# Patient Record
Sex: Female | Born: 1935 | Race: White | Hispanic: No | Smoking: Never smoker
Health system: Southern US, Community
[De-identification: ages and names within clinical notes are randomized; demographics above are authoritative.]

## PROBLEM LIST (undated history)

## (undated) DIAGNOSIS — R413 Other amnesia: Secondary | ICD-10-CM

## (undated) DIAGNOSIS — C801 Malignant (primary) neoplasm, unspecified: Secondary | ICD-10-CM

## (undated) DIAGNOSIS — I341 Nonrheumatic mitral (valve) prolapse: Secondary | ICD-10-CM

## (undated) DIAGNOSIS — C859A Non-Hodgkin lymphoma, unspecified, in remission: Secondary | ICD-10-CM

## (undated) DIAGNOSIS — M810 Age-related osteoporosis without current pathological fracture: Secondary | ICD-10-CM

## (undated) DIAGNOSIS — C8590 Non-Hodgkin lymphoma, unspecified, unspecified site: Secondary | ICD-10-CM

## (undated) DIAGNOSIS — I10 Essential (primary) hypertension: Principal | ICD-10-CM

## (undated) DIAGNOSIS — S72002A Fracture of unspecified part of neck of left femur, initial encounter for closed fracture: Secondary | ICD-10-CM

## (undated) DIAGNOSIS — Z22322 Carrier or suspected carrier of Methicillin resistant Staphylococcus aureus: Secondary | ICD-10-CM

## (undated) DIAGNOSIS — N390 Urinary tract infection, site not specified: Secondary | ICD-10-CM

## (undated) DIAGNOSIS — S7290XA Unspecified fracture of unspecified femur, initial encounter for closed fracture: Secondary | ICD-10-CM

## (undated) DIAGNOSIS — F0390 Unspecified dementia without behavioral disturbance: Secondary | ICD-10-CM

## (undated) DIAGNOSIS — I82409 Acute embolism and thrombosis of unspecified deep veins of unspecified lower extremity: Secondary | ICD-10-CM

## (undated) DIAGNOSIS — I4891 Unspecified atrial fibrillation: Secondary | ICD-10-CM

## (undated) DIAGNOSIS — E079 Disorder of thyroid, unspecified: Secondary | ICD-10-CM

## (undated) DIAGNOSIS — S329XXA Fracture of unspecified parts of lumbosacral spine and pelvis, initial encounter for closed fracture: Secondary | ICD-10-CM

## (undated) HISTORY — DX: Fracture of unspecified parts of lumbosacral spine and pelvis, initial encounter for closed fracture: S32.9XXA

## (undated) HISTORY — DX: Fracture of unspecified part of neck of left femur, initial encounter for closed fracture: S72.002A

## (undated) HISTORY — DX: Nonrheumatic mitral (valve) prolapse: I34.1

## (undated) HISTORY — DX: Carrier or suspected carrier of methicillin resistant Staphylococcus aureus: Z22.322

## (undated) HISTORY — DX: Unspecified fracture of unspecified femur, initial encounter for closed fracture: S72.90XA

## (undated) HISTORY — DX: Essential (primary) hypertension: I10

## (undated) HISTORY — PX: EYE SURGERY: SHX253

## (undated) SURGERY — Surgical Case
Anesthesia: *Unknown

---

## 1999-09-15 ENCOUNTER — Other Ambulatory Visit: Admission: RE | Admit: 1999-09-15 | Discharge: 1999-09-15 | Payer: Self-pay | Admitting: Cardiology

## 2000-03-31 ENCOUNTER — Encounter: Admission: RE | Admit: 2000-03-31 | Discharge: 2000-03-31 | Payer: Self-pay | Admitting: Cardiology

## 2000-03-31 ENCOUNTER — Encounter: Payer: Self-pay | Admitting: Cardiology

## 2000-09-20 ENCOUNTER — Other Ambulatory Visit: Admission: RE | Admit: 2000-09-20 | Discharge: 2000-09-20 | Payer: Self-pay | Admitting: Internal Medicine

## 2001-01-27 ENCOUNTER — Encounter: Payer: Self-pay | Admitting: General Surgery

## 2001-01-27 ENCOUNTER — Encounter: Admission: RE | Admit: 2001-01-27 | Discharge: 2001-01-27 | Payer: Self-pay | Admitting: General Surgery

## 2001-03-10 ENCOUNTER — Encounter: Payer: Self-pay | Admitting: *Deleted

## 2001-03-10 ENCOUNTER — Encounter: Admission: RE | Admit: 2001-03-10 | Discharge: 2001-03-10 | Payer: Self-pay | Admitting: *Deleted

## 2002-03-13 ENCOUNTER — Encounter: Payer: Self-pay | Admitting: Internal Medicine

## 2002-03-13 ENCOUNTER — Encounter: Admission: RE | Admit: 2002-03-13 | Discharge: 2002-03-13 | Payer: Self-pay | Admitting: Internal Medicine

## 2003-03-16 ENCOUNTER — Encounter: Admission: RE | Admit: 2003-03-16 | Discharge: 2003-03-16 | Payer: Self-pay | Admitting: Internal Medicine

## 2003-03-16 ENCOUNTER — Encounter: Payer: Self-pay | Admitting: Internal Medicine

## 2004-03-26 ENCOUNTER — Encounter: Admission: RE | Admit: 2004-03-26 | Discharge: 2004-03-26 | Payer: Self-pay | Admitting: Internal Medicine

## 2005-03-27 ENCOUNTER — Encounter: Admission: RE | Admit: 2005-03-27 | Discharge: 2005-03-27 | Payer: Self-pay | Admitting: Internal Medicine

## 2006-02-08 ENCOUNTER — Ambulatory Visit (HOSPITAL_COMMUNITY): Admission: RE | Admit: 2006-02-08 | Discharge: 2006-02-08 | Payer: Self-pay | Admitting: Cardiology

## 2006-04-02 ENCOUNTER — Encounter: Admission: RE | Admit: 2006-04-02 | Discharge: 2006-04-02 | Payer: Self-pay | Admitting: Internal Medicine

## 2006-07-16 ENCOUNTER — Encounter: Admission: RE | Admit: 2006-07-16 | Discharge: 2006-07-16 | Payer: Self-pay | Admitting: Otolaryngology

## 2006-07-21 ENCOUNTER — Encounter (INDEPENDENT_AMBULATORY_CARE_PROVIDER_SITE_OTHER): Payer: Self-pay | Admitting: Specialist

## 2006-07-21 ENCOUNTER — Ambulatory Visit (HOSPITAL_BASED_OUTPATIENT_CLINIC_OR_DEPARTMENT_OTHER): Admission: RE | Admit: 2006-07-21 | Discharge: 2006-07-21 | Payer: Self-pay | Admitting: Otolaryngology

## 2006-07-21 ENCOUNTER — Encounter (INDEPENDENT_AMBULATORY_CARE_PROVIDER_SITE_OTHER): Payer: Self-pay | Admitting: Otolaryngology

## 2006-07-29 ENCOUNTER — Ambulatory Visit: Payer: Self-pay | Admitting: Hematology & Oncology

## 2006-08-05 LAB — CBC WITH DIFFERENTIAL/PLATELET
Basophils Absolute: 0 10*3/uL (ref 0.0–0.1)
Eosinophils Absolute: 0 10*3/uL (ref 0.0–0.5)
HCT: 36.2 % (ref 34.8–46.6)
HGB: 12.5 g/dL (ref 11.6–15.9)
LYMPH%: 22.2 % (ref 14.0–48.0)
MCV: 95.1 fL (ref 81.0–101.0)
MONO#: 0.6 10*3/uL (ref 0.1–0.9)
MONO%: 8 % (ref 0.0–13.0)
NEUT#: 5.4 10*3/uL (ref 1.5–6.5)
NEUT%: 68.7 % (ref 39.6–76.8)
Platelets: 286 10*3/uL (ref 145–400)
WBC: 7.8 10*3/uL (ref 3.9–10.0)

## 2006-08-05 LAB — COMPREHENSIVE METABOLIC PANEL
Alkaline Phosphatase: 28 U/L — ABNORMAL LOW (ref 39–117)
BUN: 18 mg/dL (ref 6–23)
CO2: 29 mEq/L (ref 19–32)
Creatinine, Ser: 0.77 mg/dL (ref 0.40–1.20)
Glucose, Bld: 103 mg/dL — ABNORMAL HIGH (ref 70–99)
Sodium: 133 mEq/L — ABNORMAL LOW (ref 135–145)
Total Bilirubin: 1 mg/dL (ref 0.3–1.2)
Total Protein: 6.5 g/dL (ref 6.0–8.3)

## 2006-08-05 LAB — LACTATE DEHYDROGENASE: LDH: 170 U/L (ref 94–250)

## 2006-08-12 ENCOUNTER — Ambulatory Visit (HOSPITAL_COMMUNITY): Admission: RE | Admit: 2006-08-12 | Discharge: 2006-08-12 | Payer: Self-pay | Admitting: Hematology & Oncology

## 2006-08-16 ENCOUNTER — Ambulatory Visit (HOSPITAL_COMMUNITY): Admission: RE | Admit: 2006-08-16 | Discharge: 2006-08-16 | Payer: Self-pay | Admitting: Hematology & Oncology

## 2006-09-09 ENCOUNTER — Ambulatory Visit: Payer: Self-pay | Admitting: Hematology & Oncology

## 2006-09-09 LAB — CBC WITH DIFFERENTIAL/PLATELET
Basophils Absolute: 0.2 10*3/uL — ABNORMAL HIGH (ref 0.0–0.1)
EOS%: 0.4 % (ref 0.0–7.0)
Eosinophils Absolute: 0 10*3/uL (ref 0.0–0.5)
LYMPH%: 11.7 % — ABNORMAL LOW (ref 14.0–48.0)
MCH: 33.6 pg (ref 26.0–34.0)
MCV: 91.2 fL (ref 81.0–101.0)
MONO%: 14.1 % — ABNORMAL HIGH (ref 0.0–13.0)
Platelets: 237 10*3/uL (ref 145–400)
RBC: 3.6 10*6/uL — ABNORMAL LOW (ref 3.70–5.32)
RDW: 13.4 % (ref 11.3–14.5)

## 2006-09-09 LAB — COMPREHENSIVE METABOLIC PANEL
AST: 23 U/L (ref 0–37)
Albumin: 4.3 g/dL (ref 3.5–5.2)
Alkaline Phosphatase: 43 U/L (ref 39–117)
BUN: 19 mg/dL (ref 6–23)
Glucose, Bld: 77 mg/dL (ref 70–99)
Potassium: 3.6 mEq/L (ref 3.5–5.3)
Total Bilirubin: 1 mg/dL (ref 0.3–1.2)

## 2006-09-24 ENCOUNTER — Ambulatory Visit (HOSPITAL_COMMUNITY): Admission: RE | Admit: 2006-09-24 | Discharge: 2006-09-24 | Payer: Self-pay | Admitting: Hematology & Oncology

## 2006-09-30 LAB — CBC WITH DIFFERENTIAL/PLATELET
Basophils Absolute: 0.1 10*3/uL (ref 0.0–0.1)
Eosinophils Absolute: 0 10*3/uL (ref 0.0–0.5)
HCT: 29.1 % — ABNORMAL LOW (ref 34.8–46.6)
LYMPH%: 9.2 % — ABNORMAL LOW (ref 14.0–48.0)
MCV: 94.7 fL (ref 81.0–101.0)
MONO%: 14.8 % — ABNORMAL HIGH (ref 0.0–13.0)
NEUT#: 10.2 10*3/uL — ABNORMAL HIGH (ref 1.5–6.5)
NEUT%: 74.7 % (ref 39.6–76.8)
Platelets: 296 10*3/uL (ref 145–400)
RBC: 3.07 10*6/uL — ABNORMAL LOW (ref 3.70–5.32)

## 2006-09-30 LAB — COMPREHENSIVE METABOLIC PANEL
Alkaline Phosphatase: 31 U/L — ABNORMAL LOW (ref 39–117)
BUN: 29 mg/dL — ABNORMAL HIGH (ref 6–23)
Creatinine, Ser: 0.69 mg/dL (ref 0.40–1.20)
Glucose, Bld: 81 mg/dL (ref 70–99)
Total Bilirubin: 0.6 mg/dL (ref 0.3–1.2)

## 2006-09-30 LAB — LACTATE DEHYDROGENASE: LDH: 243 U/L (ref 94–250)

## 2006-10-08 LAB — URINALYSIS, MICROSCOPIC - CHCC
Bilirubin (Urine): NEGATIVE
Glucose: NEGATIVE g/dL
Nitrite: NEGATIVE

## 2006-10-22 LAB — COMPREHENSIVE METABOLIC PANEL
AST: 19 U/L (ref 0–37)
Alkaline Phosphatase: 29 U/L — ABNORMAL LOW (ref 39–117)
BUN: 16 mg/dL (ref 6–23)
Glucose, Bld: 82 mg/dL (ref 70–99)
Potassium: 3.7 mEq/L (ref 3.5–5.3)
Sodium: 132 mEq/L — ABNORMAL LOW (ref 135–145)
Total Bilirubin: 0.8 mg/dL (ref 0.3–1.2)
Total Protein: 6 g/dL (ref 6.0–8.3)

## 2006-10-22 LAB — CBC WITH DIFFERENTIAL/PLATELET
EOS%: 0.1 % (ref 0.0–7.0)
Eosinophils Absolute: 0 10*3/uL (ref 0.0–0.5)
LYMPH%: 7.1 % — ABNORMAL LOW (ref 14.0–48.0)
MCH: 36.5 pg — ABNORMAL HIGH (ref 26.0–34.0)
MCV: 104 fL — ABNORMAL HIGH (ref 81.0–101.0)
MONO%: 16.9 % — ABNORMAL HIGH (ref 0.0–13.0)
NEUT#: 6.6 10*3/uL — ABNORMAL HIGH (ref 1.5–6.5)
Platelets: 247 10*3/uL (ref 145–400)
RBC: 2.8 10*6/uL — ABNORMAL LOW (ref 3.70–5.32)
RDW: 19.5 % — ABNORMAL HIGH (ref 11.3–14.5)

## 2006-11-05 ENCOUNTER — Ambulatory Visit (HOSPITAL_COMMUNITY): Admission: RE | Admit: 2006-11-05 | Discharge: 2006-11-05 | Payer: Self-pay | Admitting: Hematology & Oncology

## 2006-11-09 ENCOUNTER — Ambulatory Visit: Payer: Self-pay | Admitting: Hematology & Oncology

## 2006-11-11 LAB — COMPREHENSIVE METABOLIC PANEL
AST: 32 U/L (ref 0–37)
Alkaline Phosphatase: 36 U/L — ABNORMAL LOW (ref 39–117)
BUN: 14 mg/dL (ref 6–23)
Calcium: 8.9 mg/dL (ref 8.4–10.5)
Creatinine, Ser: 0.7 mg/dL (ref 0.40–1.20)

## 2006-11-11 LAB — CBC WITH DIFFERENTIAL/PLATELET
Basophils Absolute: 0 10*3/uL (ref 0.0–0.1)
Eosinophils Absolute: 0 10*3/uL (ref 0.0–0.5)
HGB: 10.8 g/dL — ABNORMAL LOW (ref 11.6–15.9)
MCV: 109 fL — ABNORMAL HIGH (ref 81.0–101.0)
MONO%: 16.7 % — ABNORMAL HIGH (ref 0.0–13.0)
NEUT#: 6.9 10*3/uL — ABNORMAL HIGH (ref 1.5–6.5)
Platelets: 262 10*3/uL (ref 145–400)
RDW: 18.1 % — ABNORMAL HIGH (ref 11.3–14.5)

## 2006-12-02 LAB — COMPREHENSIVE METABOLIC PANEL
BUN: 16 mg/dL (ref 6–23)
CO2: 28 mEq/L (ref 19–32)
Calcium: 8.3 mg/dL — ABNORMAL LOW (ref 8.4–10.5)
Chloride: 94 mEq/L — ABNORMAL LOW (ref 96–112)
Creatinine, Ser: 0.79 mg/dL (ref 0.40–1.20)
Glucose, Bld: 114 mg/dL — ABNORMAL HIGH (ref 70–99)

## 2006-12-02 LAB — CBC WITH DIFFERENTIAL/PLATELET
Basophils Absolute: 0 10*3/uL (ref 0.0–0.1)
HCT: 30 % — ABNORMAL LOW (ref 34.8–46.6)
HGB: 10.3 g/dL — ABNORMAL LOW (ref 11.6–15.9)
LYMPH%: 4.6 % — ABNORMAL LOW (ref 14.0–48.0)
MONO#: 1.4 10*3/uL — ABNORMAL HIGH (ref 0.1–0.9)
NEUT%: 76.1 % (ref 39.6–76.8)
Platelets: 238 10*3/uL (ref 145–400)
WBC: 7.3 10*3/uL (ref 3.9–10.0)
lymph#: 0.3 10*3/uL — ABNORMAL LOW (ref 0.9–3.3)

## 2006-12-08 ENCOUNTER — Ambulatory Visit (HOSPITAL_COMMUNITY): Admission: RE | Admit: 2006-12-08 | Discharge: 2006-12-08 | Payer: Self-pay | Admitting: Hematology & Oncology

## 2006-12-11 ENCOUNTER — Inpatient Hospital Stay (HOSPITAL_COMMUNITY): Admission: EM | Admit: 2006-12-11 | Discharge: 2006-12-31 | Payer: Self-pay | Admitting: Emergency Medicine

## 2006-12-11 ENCOUNTER — Ambulatory Visit: Payer: Self-pay | Admitting: Hematology & Oncology

## 2006-12-17 ENCOUNTER — Encounter (INDEPENDENT_AMBULATORY_CARE_PROVIDER_SITE_OTHER): Payer: Self-pay | Admitting: Specialist

## 2006-12-17 ENCOUNTER — Encounter (INDEPENDENT_AMBULATORY_CARE_PROVIDER_SITE_OTHER): Payer: Self-pay | Admitting: Radiology

## 2006-12-31 ENCOUNTER — Ambulatory Visit: Payer: Self-pay | Admitting: Hematology & Oncology

## 2007-01-06 LAB — CBC WITH DIFFERENTIAL/PLATELET
BASO%: 0.3 % (ref 0.0–2.0)
Basophils Absolute: 0 10*3/uL (ref 0.0–0.1)
Eosinophils Absolute: 0 10*3/uL (ref 0.0–0.5)
HCT: 37.1 % (ref 34.8–46.6)
HGB: 13.1 g/dL (ref 11.6–15.9)
MCHC: 35.2 g/dL (ref 32.0–36.0)
MONO#: 0.5 10*3/uL (ref 0.1–0.9)
NEUT#: 4.9 10*3/uL (ref 1.5–6.5)
NEUT%: 78.6 % — ABNORMAL HIGH (ref 39.6–76.8)
WBC: 6.2 10*3/uL (ref 3.9–10.0)
lymph#: 0.8 10*3/uL — ABNORMAL LOW (ref 0.9–3.3)

## 2007-01-06 LAB — COMPREHENSIVE METABOLIC PANEL
ALT: 30 U/L (ref 0–35)
CO2: 31 mEq/L (ref 19–32)
Calcium: 9.1 mg/dL (ref 8.4–10.5)
Chloride: 92 mEq/L — ABNORMAL LOW (ref 96–112)
Creatinine, Ser: 0.62 mg/dL (ref 0.40–1.20)
Total Protein: 6.4 g/dL (ref 6.0–8.3)

## 2007-01-21 LAB — CBC WITH DIFFERENTIAL/PLATELET
Eosinophils Absolute: 0.1 10*3/uL (ref 0.0–0.5)
HCT: 34 % — ABNORMAL LOW (ref 34.8–46.6)
LYMPH%: 17.4 % (ref 14.0–48.0)
MCHC: 34.5 g/dL (ref 32.0–36.0)
MCV: 97.7 fL (ref 81.0–101.0)
MONO%: 12.9 % (ref 0.0–13.0)
NEUT#: 4.2 10*3/uL (ref 1.5–6.5)
NEUT%: 67.6 % (ref 39.6–76.8)
Platelets: 253 10*3/uL (ref 145–400)
RBC: 3.48 10*6/uL — ABNORMAL LOW (ref 3.70–5.32)

## 2007-01-21 LAB — BASIC METABOLIC PANEL
BUN: 17 mg/dL (ref 6–23)
CO2: 29 mEq/L (ref 19–32)
Chloride: 97 mEq/L (ref 96–112)
Glucose, Bld: 102 mg/dL — ABNORMAL HIGH (ref 70–99)
Potassium: 3.8 mEq/L (ref 3.5–5.3)

## 2007-01-21 LAB — PROTIME-INR: Protime: 24 Seconds — ABNORMAL HIGH (ref 10.6–13.4)

## 2007-01-27 LAB — BASIC METABOLIC PANEL
BUN: 15 mg/dL (ref 6–23)
CO2: 31 mEq/L (ref 19–32)
Calcium: 9.3 mg/dL (ref 8.4–10.5)
Creatinine, Ser: 0.64 mg/dL (ref 0.40–1.20)
Glucose, Bld: 95 mg/dL (ref 70–99)
Sodium: 135 mEq/L (ref 135–145)

## 2007-02-10 LAB — BASIC METABOLIC PANEL WITH GFR
BUN: 13 mg/dL (ref 6–23)
CO2: 30 meq/L (ref 19–32)
Calcium: 8.9 mg/dL (ref 8.4–10.5)
Chloride: 95 meq/L — ABNORMAL LOW (ref 96–112)
Creatinine, Ser: 0.72 mg/dL (ref 0.40–1.20)
Glucose, Bld: 142 mg/dL — ABNORMAL HIGH (ref 70–99)
Potassium: 3.7 meq/L (ref 3.5–5.3)
Sodium: 132 meq/L — ABNORMAL LOW (ref 135–145)

## 2007-02-22 ENCOUNTER — Ambulatory Visit: Payer: Self-pay | Admitting: Hematology & Oncology

## 2007-02-24 LAB — BASIC METABOLIC PANEL
BUN: 16 mg/dL (ref 6–23)
Calcium: 9.2 mg/dL (ref 8.4–10.5)
Glucose, Bld: 85 mg/dL (ref 70–99)
Potassium: 3.9 mEq/L (ref 3.5–5.3)
Sodium: 136 mEq/L (ref 135–145)

## 2007-03-04 LAB — CBC WITH DIFFERENTIAL/PLATELET
BASO%: 0.5 % (ref 0.0–2.0)
Eosinophils Absolute: 0 10*3/uL (ref 0.0–0.5)
HCT: 36.1 % (ref 34.8–46.6)
LYMPH%: 16.4 % (ref 14.0–48.0)
MCHC: 34.8 g/dL (ref 32.0–36.0)
MONO#: 0.6 10*3/uL (ref 0.1–0.9)
NEUT#: 3.3 10*3/uL (ref 1.5–6.5)
Platelets: 217 10*3/uL (ref 145–400)
RBC: 3.74 10*6/uL (ref 3.70–5.32)
WBC: 4.7 10*3/uL (ref 3.9–10.0)
lymph#: 0.8 10*3/uL — ABNORMAL LOW (ref 0.9–3.3)

## 2007-03-04 LAB — COMPREHENSIVE METABOLIC PANEL
ALT: 27 U/L (ref 0–35)
Albumin: 3.9 g/dL (ref 3.5–5.2)
CO2: 27 mEq/L (ref 19–32)
Calcium: 9.2 mg/dL (ref 8.4–10.5)
Chloride: 98 mEq/L (ref 96–112)
Glucose, Bld: 95 mg/dL (ref 70–99)
Sodium: 137 mEq/L (ref 135–145)
Total Protein: 6.1 g/dL (ref 6.0–8.3)

## 2007-03-04 LAB — LACTATE DEHYDROGENASE: LDH: 242 U/L (ref 94–250)

## 2007-04-01 ENCOUNTER — Ambulatory Visit (HOSPITAL_COMMUNITY): Admission: RE | Admit: 2007-04-01 | Discharge: 2007-04-01 | Payer: Self-pay | Admitting: Hematology & Oncology

## 2007-04-14 ENCOUNTER — Ambulatory Visit: Payer: Self-pay | Admitting: Hematology & Oncology

## 2007-04-18 LAB — COMPREHENSIVE METABOLIC PANEL
AST: 32 U/L (ref 0–37)
Albumin: 4.1 g/dL (ref 3.5–5.2)
Alkaline Phosphatase: 55 U/L (ref 39–117)
BUN: 20 mg/dL (ref 6–23)
Calcium: 9.3 mg/dL (ref 8.4–10.5)
Chloride: 99 mEq/L (ref 96–112)
Glucose, Bld: 155 mg/dL — ABNORMAL HIGH (ref 70–99)
Potassium: 4.3 mEq/L (ref 3.5–5.3)
Sodium: 136 mEq/L (ref 135–145)
Total Protein: 6.2 g/dL (ref 6.0–8.3)

## 2007-04-18 LAB — CBC WITH DIFFERENTIAL/PLATELET
BASO%: 0.4 % (ref 0.0–2.0)
EOS%: 1.5 % (ref 0.0–7.0)
HCT: 35 % (ref 34.8–46.6)
MCH: 33.5 pg (ref 26.0–34.0)
MCHC: 35.8 g/dL (ref 32.0–36.0)
MONO#: 0.7 10*3/uL (ref 0.1–0.9)
NEUT%: 64.3 % (ref 39.6–76.8)
RBC: 3.73 10*6/uL (ref 3.70–5.32)
RDW: 13.1 % (ref 11.3–14.5)
WBC: 5.1 10*3/uL (ref 3.9–10.0)
lymph#: 1 10*3/uL (ref 0.9–3.3)

## 2007-05-02 LAB — BASIC METABOLIC PANEL
BUN: 15 mg/dL (ref 6–23)
Calcium: 9.3 mg/dL (ref 8.4–10.5)
Chloride: 98 mEq/L (ref 96–112)
Creatinine, Ser: 0.72 mg/dL (ref 0.40–1.20)

## 2007-05-27 ENCOUNTER — Encounter: Admission: RE | Admit: 2007-05-27 | Discharge: 2007-05-27 | Payer: Self-pay | Admitting: Internal Medicine

## 2007-09-15 ENCOUNTER — Ambulatory Visit (HOSPITAL_COMMUNITY): Admission: RE | Admit: 2007-09-15 | Discharge: 2007-09-15 | Payer: Self-pay | Admitting: Hematology & Oncology

## 2007-09-27 ENCOUNTER — Ambulatory Visit: Payer: Self-pay | Admitting: Hematology & Oncology

## 2007-09-29 LAB — COMPREHENSIVE METABOLIC PANEL
Albumin: 4.3 g/dL (ref 3.5–5.2)
Alkaline Phosphatase: 46 U/L (ref 39–117)
BUN: 14 mg/dL (ref 6–23)
CO2: 28 mEq/L (ref 19–32)
Calcium: 9.4 mg/dL (ref 8.4–10.5)
Chloride: 96 mEq/L (ref 96–112)
Glucose, Bld: 97 mg/dL (ref 70–99)
Potassium: 4.2 mEq/L (ref 3.5–5.3)
Sodium: 135 mEq/L (ref 135–145)
Total Protein: 6.4 g/dL (ref 6.0–8.3)

## 2007-09-29 LAB — CBC WITH DIFFERENTIAL/PLATELET
Basophils Absolute: 0 10*3/uL (ref 0.0–0.1)
Eosinophils Absolute: 0.1 10*3/uL (ref 0.0–0.5)
HGB: 13.1 g/dL (ref 11.6–15.9)
MCV: 94.1 fL (ref 81.0–101.0)
MONO#: 0.6 10*3/uL (ref 0.1–0.9)
MONO%: 10.7 % (ref 0.0–13.0)
NEUT#: 4.2 10*3/uL (ref 1.5–6.5)
RBC: 3.94 10*6/uL (ref 3.70–5.32)
RDW: 13.6 % (ref 11.3–14.5)
WBC: 6.1 10*3/uL (ref 3.9–10.0)
lymph#: 1.2 10*3/uL (ref 0.9–3.3)

## 2008-03-08 ENCOUNTER — Ambulatory Visit (HOSPITAL_COMMUNITY): Admission: RE | Admit: 2008-03-08 | Discharge: 2008-03-08 | Payer: Self-pay | Admitting: Hematology & Oncology

## 2008-03-13 ENCOUNTER — Ambulatory Visit: Payer: Self-pay | Admitting: Hematology & Oncology

## 2008-03-15 LAB — CBC WITH DIFFERENTIAL/PLATELET
BASO%: 0.9 % (ref 0.0–2.0)
EOS%: 1.9 % (ref 0.0–7.0)
HGB: 13.7 g/dL (ref 11.6–15.9)
MCH: 33.1 pg (ref 26.0–34.0)
MCHC: 35.1 g/dL (ref 32.0–36.0)
RDW: 13.1 % (ref 11.3–14.5)
lymph#: 1.5 10*3/uL (ref 0.9–3.3)

## 2008-03-15 LAB — COMPREHENSIVE METABOLIC PANEL
ALT: 27 U/L (ref 0–35)
AST: 31 U/L (ref 0–37)
Albumin: 4.2 g/dL (ref 3.5–5.2)
Calcium: 8.9 mg/dL (ref 8.4–10.5)
Chloride: 97 mEq/L (ref 96–112)
Potassium: 4.3 mEq/L (ref 3.5–5.3)

## 2008-05-28 ENCOUNTER — Encounter: Admission: RE | Admit: 2008-05-28 | Discharge: 2008-05-28 | Payer: Self-pay | Admitting: Internal Medicine

## 2008-09-20 ENCOUNTER — Ambulatory Visit (HOSPITAL_COMMUNITY): Admission: RE | Admit: 2008-09-20 | Discharge: 2008-09-20 | Payer: Self-pay | Admitting: Hematology & Oncology

## 2008-09-28 ENCOUNTER — Ambulatory Visit: Payer: Self-pay | Admitting: Hematology & Oncology

## 2008-10-01 LAB — CBC WITH DIFFERENTIAL (CANCER CENTER ONLY)
BASO%: 0.6 % (ref 0.0–2.0)
HCT: 41.5 % (ref 34.8–46.6)
LYMPH#: 1.4 10*3/uL (ref 0.9–3.3)
MONO#: 0.4 10*3/uL (ref 0.1–0.9)
NEUT%: 69.6 % (ref 39.6–80.0)
Platelets: 204 10*3/uL (ref 145–400)
RDW: 10.9 % (ref 10.5–14.6)
WBC: 6.6 10*3/uL (ref 3.9–10.0)

## 2008-10-01 LAB — COMPREHENSIVE METABOLIC PANEL
ALT: 26 U/L (ref 0–35)
CO2: 28 mEq/L (ref 19–32)
Calcium: 9.2 mg/dL (ref 8.4–10.5)
Chloride: 98 mEq/L (ref 96–112)
Creatinine, Ser: 0.76 mg/dL (ref 0.40–1.20)
Glucose, Bld: 90 mg/dL (ref 70–99)
Total Protein: 6.5 g/dL (ref 6.0–8.3)

## 2008-10-01 LAB — LACTATE DEHYDROGENASE: LDH: 230 U/L (ref 94–250)

## 2008-11-06 IMAGING — CT NM PET TUM IMG SKULL BASE T - THIGH
1 of 4 series · 1 of 25 positions shown · IV contrast ([ID])
Comparison: 09/24/06 and 08/12/06.

CLINICAL DATA: Left tonsillar lymphoma.  Patient is being treated with chemotherapy.
 FDG PET-CT TUMOR IMAGING (SKULL BASE TO THIGHS):
 Fasting Blood Glucose:  112.
TECHNIQUE: 16.2 mCi F-18 FDG was injected intravenously via the right antecubital fossa.  Full-ring PET imaging was performed from the skull base through the mid-thighs 63 minutes after injection.  CT data was obtained and used for attenuation correction and anatomic localization only.  (This was not acquired as a diagnostic CT examination).  Additional coned-down field of view imaging was also performed of the head and neck for high-resolution evaluation given history of tonsillar lymphoma.

[Series 1: pet ac · axial · 3.3mm · 4.69mm/px · 1 of 91 slices shown]
[im 52/91]
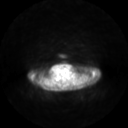

[1 of 25 positions shown; findings below may reference images not displayed]

FINDINGS: No recurrent hypermetabolic soft tissue activity is seen at the site of the original left tonsillar lymphoma.  There is stable focal nodular activity in the right lobe of the thyroid gland with measured SUV max of between 2.3 and 2.8.  This is stable when compared to the prior PET scan.  Given persistent evidence of low-grade activity in a thyroid nodule, it still is ultimately recommended to correlate this finding with ultrasound as there likely is a focal nodule present in the right lobe.  Given stable low-level activity, this may certainly be benign.
 The rest of the body demonstrates no new lymph node activity or abnormally increased solid organ activity. No focal bony lesions.
IMPRESSION: No evidence of recurrent abnormal metabolic activity at the level of left tonsillar lymphoma.  No new lymph node activity is identified. Stable low-level nodular activity in the right lobe of the thyroid corresponding to the region of a thyroid nodule.

## 2008-12-14 IMAGING — PT NM PET TUM IMG SKULL BASE T - THIGH
7 series · 25 of 25 positions shown · non-contrast
Comparison: PET CT of 11/05/06 and 09/24/06.  Diagnostic CT(s) of 08/16/06 also reviewed.

CLINICAL DATA: Restaging of lymphoma.  Last Chemotherapy 6 weeks ago.
 FDG PET-CT TUMOR IMAGING (SKULL BASE TO THIGHS):

 Fasting Blood Glucose:  111.
TECHNIQUE: 16.6 mCi F-18 FDG were administered via left AC.  Full ring PET imaging was performed from the skull base through the mid-thighs 61 minutes after injection.  CT data was obtained and used for attenuation correction and anatomic localization only.  (This was not acquired as a diagnostic CT examination.)

[Series 1: pet ac · axial · 3.3mm · 4.69mm/px · z∈[-870,+0]mm · 4 of 267 slices shown]
[im 1/267]
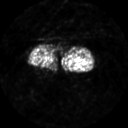
[im 89/267]
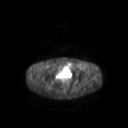
[im 178/267]
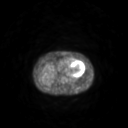
[im 267/267]
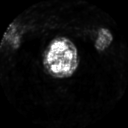

[Series 2: ct images · axial · 3.8mm · 0.98mm/px · z∈[-870,+0]mm · 4 of 267 slices shown]
[im 1/267]
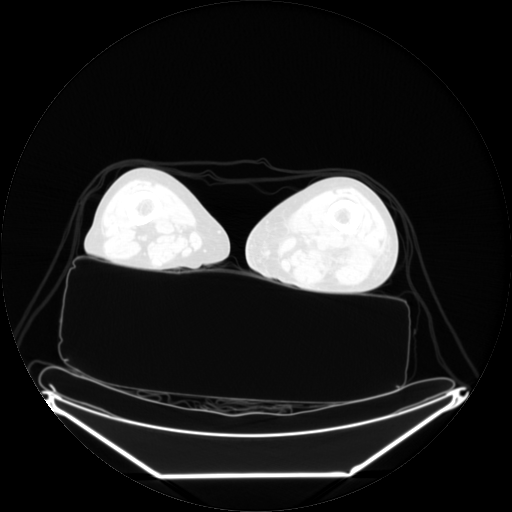
[im 89/267]
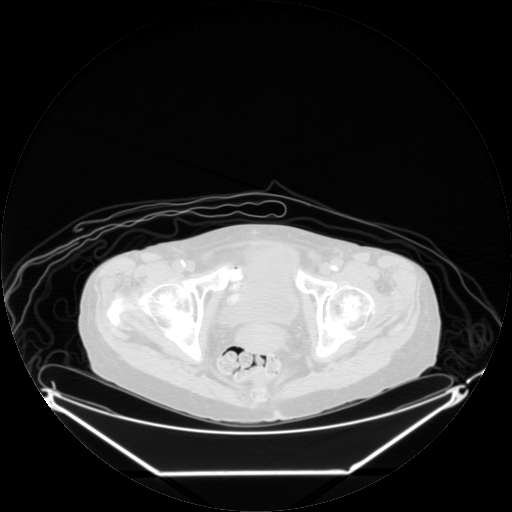
[im 178/267]
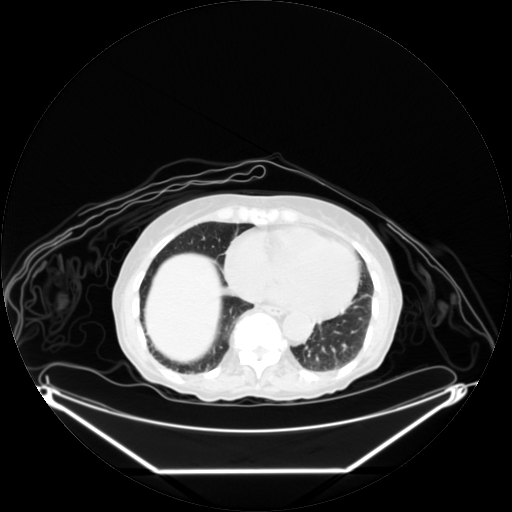
[im 267/267]
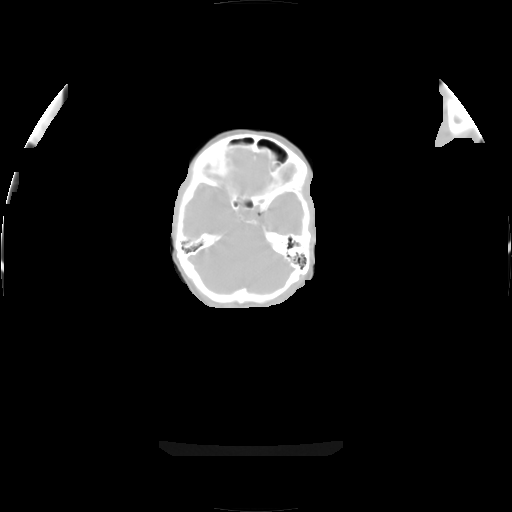

[Series 2: pet nac · axial · 3.3mm · 4.69mm/px · z∈[-870,+0]mm · 5 of 267 slices shown]
[im 1/267]
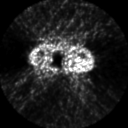
[im 67/267]
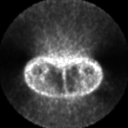
[im 134/267]
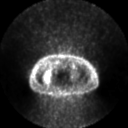
[im 200/267]
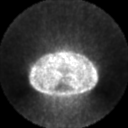
[im 267/267]
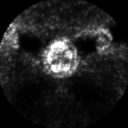

[Series 123: mip · coronal · 3.3mm · 4.69mm/px · 1 of 30 slices shown]
[im 1/30]
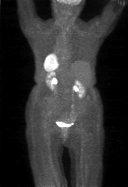

[Series 151: reformatted · axial · 3.3mm · 3.91mm/px · z∈[-867,+0]mm · 5 of 264 slices shown (1 of 3)]
[im 1/264]
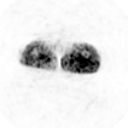
[im 66/264]
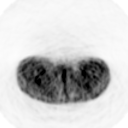
[im 132/264]
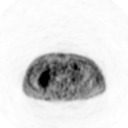
[im 198/264]
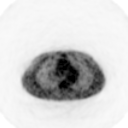
[im 264/264]
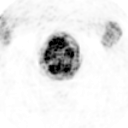

[Series 154: reformatted · coronal · 3.3mm · 6.98mm/px · 5 of 264 slices shown (2 of 3)]
[im 1/264]
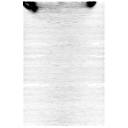
[im 66/264]
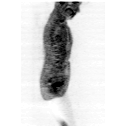
[im 132/264]
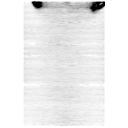
[im 198/264]
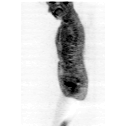
[im 264/264]
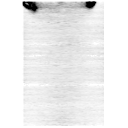

[Series 155: reformatted · coronal · 3.3mm · 6.98mm/px · 1 of 70 slices shown (3 of 3)]
[im 1/70]
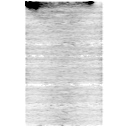

[25 of 25 positions shown; findings below may reference images not displayed]

FINDINGS: PET images demonstrate no abnormal activity within the upper neck. 
 Again demonstrated is a focus of hypermetabolism measuring a maximum SUV of 2.3 gm/ml at the right lobe of the thyroid.  This is similar to on the prior exam.  On the diagnostic chest CT of 08/16/06 there is a subtle soft tissue fullness in this area inferiorly.  
 No abnormal activity within the chest, abdomen, or pelvis.  
 Within the posterior left thigh, there is mild diffuse activity, which corresponds to edema (example image 256 and 249).  This is new since the prior exam. 
 CT images performed for attenuation correction demonstrates cardiomegaly and left atrial enlargement.
IMPRESSION: 1.  No hypermetabolism to suggest active lymphoma. 
 2.  Persistent activity within the right lobe of the thyroid inferiorly.  This is nonspecific and mild.  It could relate to an area of thyroiditis or a focal thyroid nodule.  Ultrasound should be considered.  
 3.  New activity and edema within the posterior left thigh.  Correlate with physical examination.  Differential etiologies would include deep venous thrombosis, ruptured Baker?s cyst or cellulitis.

## 2009-04-05 ENCOUNTER — Ambulatory Visit: Payer: Self-pay | Admitting: Hematology & Oncology

## 2009-04-08 LAB — LACTATE DEHYDROGENASE: LDH: 204 U/L (ref 94–250)

## 2009-04-08 LAB — CBC WITH DIFFERENTIAL (CANCER CENTER ONLY)
BASO%: 0.5 % (ref 0.0–2.0)
LYMPH#: 1.2 10*3/uL (ref 0.9–3.3)
LYMPH%: 14.4 % (ref 14.0–48.0)
MCV: 97 fL (ref 81–101)
MONO#: 0.6 10*3/uL (ref 0.1–0.9)
Platelets: 203 10*3/uL (ref 145–400)
RDW: 11.5 % (ref 10.5–14.6)
WBC: 8.6 10*3/uL (ref 3.9–10.0)

## 2009-04-08 LAB — COMPREHENSIVE METABOLIC PANEL
ALT: 22 U/L (ref 0–35)
AST: 25 U/L (ref 0–37)
Alkaline Phosphatase: 43 U/L (ref 39–117)
CO2: 26 mEq/L (ref 19–32)
Creatinine, Ser: 0.8 mg/dL (ref 0.40–1.20)
Total Bilirubin: 2.3 mg/dL — ABNORMAL HIGH (ref 0.3–1.2)

## 2009-05-29 ENCOUNTER — Encounter: Admission: RE | Admit: 2009-05-29 | Discharge: 2009-05-29 | Payer: Self-pay | Admitting: Internal Medicine

## 2009-10-04 ENCOUNTER — Ambulatory Visit: Payer: Self-pay | Admitting: Hematology & Oncology

## 2009-10-07 LAB — CBC WITH DIFFERENTIAL (CANCER CENTER ONLY)
BASO%: 0.8 % (ref 0.0–2.0)
LYMPH%: 29.9 % (ref 14.0–48.0)
MCV: 95 fL (ref 81–101)
MONO#: 0.5 10*3/uL (ref 0.1–0.9)
MONO%: 6.9 % (ref 0.0–13.0)
NEUT#: 4.3 10*3/uL (ref 1.5–6.5)
Platelets: 182 10*3/uL (ref 145–400)
RBC: 4.51 10*6/uL (ref 3.70–5.32)
RDW: 11.2 % (ref 10.5–14.6)
WBC: 7.1 10*3/uL (ref 3.9–10.0)

## 2009-10-08 LAB — COMPREHENSIVE METABOLIC PANEL
Albumin: 4.7 g/dL (ref 3.5–5.2)
BUN: 18 mg/dL (ref 6–23)
CO2: 28 mEq/L (ref 19–32)
Calcium: 9.6 mg/dL (ref 8.4–10.5)
Chloride: 97 mEq/L (ref 96–112)
Glucose, Bld: 161 mg/dL — ABNORMAL HIGH (ref 70–99)
Potassium: 4 mEq/L (ref 3.5–5.3)
Sodium: 136 mEq/L (ref 135–145)
Total Protein: 6.7 g/dL (ref 6.0–8.3)

## 2009-10-08 LAB — VITAMIN D 25 HYDROXY (VIT D DEFICIENCY, FRACTURES): Vit D, 25-Hydroxy: 31 ng/mL (ref 30–89)

## 2009-10-08 LAB — LACTATE DEHYDROGENASE: LDH: 241 U/L (ref 94–250)

## 2009-10-26 ENCOUNTER — Emergency Department (HOSPITAL_BASED_OUTPATIENT_CLINIC_OR_DEPARTMENT_OTHER): Admission: EM | Admit: 2009-10-26 | Discharge: 2009-10-26 | Payer: Self-pay | Admitting: Emergency Medicine

## 2009-11-12 ENCOUNTER — Encounter: Admission: RE | Admit: 2009-11-12 | Discharge: 2009-11-12 | Payer: Self-pay | Admitting: Internal Medicine

## 2010-04-04 ENCOUNTER — Ambulatory Visit: Payer: Self-pay | Admitting: Hematology & Oncology

## 2010-04-19 ENCOUNTER — Emergency Department (HOSPITAL_BASED_OUTPATIENT_CLINIC_OR_DEPARTMENT_OTHER): Admission: EM | Admit: 2010-04-19 | Discharge: 2010-04-19 | Payer: Self-pay | Admitting: Emergency Medicine

## 2010-04-25 LAB — CBC WITH DIFFERENTIAL (CANCER CENTER ONLY)
BASO#: 0.1 10*3/uL (ref 0.0–0.2)
EOS%: 1.6 % (ref 0.0–7.0)
HGB: 14 g/dL (ref 11.6–15.9)
LYMPH%: 25.2 % (ref 14.0–48.0)
MCH: 33.6 pg (ref 26.0–34.0)
MCHC: 33.8 g/dL (ref 32.0–36.0)
MCV: 99 fL (ref 81–101)
MONO%: 7.6 % (ref 0.0–13.0)
NEUT#: 4.9 10*3/uL (ref 1.5–6.5)
NEUT%: 64.8 % (ref 39.6–80.0)

## 2010-04-26 LAB — COMPREHENSIVE METABOLIC PANEL
AST: 38 U/L — ABNORMAL HIGH (ref 0–37)
Alkaline Phosphatase: 41 U/L (ref 39–117)
BUN: 13 mg/dL (ref 6–23)
Creatinine, Ser: 0.88 mg/dL (ref 0.40–1.20)
Potassium: 3.7 mEq/L (ref 3.5–5.3)
Total Bilirubin: 2 mg/dL — ABNORMAL HIGH (ref 0.3–1.2)

## 2010-05-30 ENCOUNTER — Encounter: Admission: RE | Admit: 2010-05-30 | Discharge: 2010-05-30 | Payer: Self-pay | Admitting: Internal Medicine

## 2010-10-14 ENCOUNTER — Ambulatory Visit: Payer: Self-pay | Admitting: Hematology & Oncology

## 2010-11-03 LAB — CBC WITH DIFFERENTIAL (CANCER CENTER ONLY)
BASO#: 0 10*3/uL (ref 0.0–0.2)
Eosinophils Absolute: 0.1 10*3/uL (ref 0.0–0.5)
HCT: 41.9 % (ref 34.8–46.6)
HGB: 13.9 g/dL (ref 11.6–15.9)
LYMPH%: 18.6 % (ref 14.0–48.0)
MCH: 32.5 pg (ref 26.0–34.0)
MCV: 98 fL (ref 81–101)
MONO#: 0.6 10*3/uL (ref 0.1–0.9)
MONO%: 8.3 % (ref 0.0–13.0)
RBC: 4.28 10*6/uL (ref 3.70–5.32)
WBC: 7.4 10*3/uL (ref 3.9–10.0)

## 2010-11-04 LAB — LACTATE DEHYDROGENASE: LDH: 221 U/L (ref 94–250)

## 2010-11-04 LAB — COMPREHENSIVE METABOLIC PANEL
Albumin: 4.2 g/dL (ref 3.5–5.2)
Alkaline Phosphatase: 45 U/L (ref 39–117)
BUN: 15 mg/dL (ref 6–23)
CO2: 31 mEq/L (ref 19–32)
Glucose, Bld: 101 mg/dL — ABNORMAL HIGH (ref 70–99)
Total Bilirubin: 1.4 mg/dL — ABNORMAL HIGH (ref 0.3–1.2)
Total Protein: 6.5 g/dL (ref 6.0–8.3)

## 2010-11-04 LAB — VITAMIN D 25 HYDROXY (VIT D DEFICIENCY, FRACTURES): Vit D, 25-Hydroxy: 59 ng/mL (ref 30–89)

## 2010-11-11 ENCOUNTER — Encounter
Admission: RE | Admit: 2010-11-11 | Discharge: 2010-11-11 | Payer: Self-pay | Source: Home / Self Care | Attending: Internal Medicine | Admitting: Internal Medicine

## 2010-12-21 ENCOUNTER — Encounter: Payer: Self-pay | Admitting: Hematology & Oncology

## 2011-02-16 LAB — URINALYSIS, ROUTINE W REFLEX MICROSCOPIC
Bilirubin Urine: NEGATIVE
Ketones, ur: NEGATIVE mg/dL
Nitrite: NEGATIVE
Specific Gravity, Urine: 1.009 (ref 1.005–1.030)
Urobilinogen, UA: 0.2 mg/dL (ref 0.0–1.0)
pH: 6 (ref 5.0–8.0)

## 2011-02-16 LAB — DIFFERENTIAL
Basophils Absolute: 0 10*3/uL (ref 0.0–0.1)
Basophils Relative: 1 % (ref 0–1)
Eosinophils Absolute: 0.1 10*3/uL (ref 0.0–0.7)
Monocytes Absolute: 0.8 10*3/uL (ref 0.1–1.0)
Monocytes Relative: 11 % (ref 3–12)

## 2011-02-16 LAB — BASIC METABOLIC PANEL
CO2: 28 mEq/L (ref 19–32)
Calcium: 8.7 mg/dL (ref 8.4–10.5)
Chloride: 97 mEq/L (ref 96–112)
Creatinine, Ser: 0.8 mg/dL (ref 0.4–1.2)
GFR calc Af Amer: >60 mL/min (ref >60)
Glucose, Bld: 99 mg/dL (ref 70–99)
Sodium: 133 mEq/L — ABNORMAL LOW (ref 135–145)

## 2011-02-16 LAB — CBC
Hemoglobin: 12.2 g/dL (ref 12.0–15.0)
MCHC: 34.5 g/dL (ref 30.0–36.0)
MCV: 99.5 fL (ref 78.0–100.0)
RBC: 3.55 MIL/uL — ABNORMAL LOW (ref 3.87–5.11)
RDW: 13.3 % (ref 11.5–15.5)

## 2011-02-16 LAB — URINE MICROSCOPIC-ADD ON

## 2011-04-17 NOTE — Discharge Summary (Signed)
NAME:  Tasha Ball, Tasha Ball                 ACCOUNT NO.:  [id number]   MEDICAL RECORD NO.:  [id number]          PATIENT TYPE:  INP   LOCATION:  1334                         FACILITY:  [facility]   PHYSICIAN:  Rose Phi. Myna Hidalgo, M.D. DATE OF BIRTH:  November 01, 1924   DATE OF ADMISSION:  12/11/2006  DATE OF DISCHARGE:  12/31/2006                               DISCHARGE SUMMARY   DISCHARGE DIAGNOSES:  1. 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  2. Non-Hodgkin's lymphoma - remission.  3. Atrial fibrillation.  4. Hypertension.  5. Hypomagnesemia.  6. Deconditioning.  7. Hypokalemia.  8. Hypothyroidism.   CONDITION ON DISCHARGE:  Stable.   ACTIVITY:  The patient will get home health physical therapy.   DIET:  No restrictions.   FOLLOWUP:  1. The patient will see Dr. Myna Hidalgo in the office in 10 days.  2. The patient will see Dr. Rodrigo Ran in his office in 1 weeks.   DISCHARGE MEDICATIONS:  1. Synthroid 0.1 mg p.o. daily.  2. Coumadin 5 mg p.o. daily.  3. Avapro 300 mg p.o. daily.  4. Toprol XL 50 mg p.o. daily.  5. Norvasc 5 mg p.o. daily.  6. Magnesium Oxide 400 mg p.o. b.i.d.  7. K-Dur 20 mEq p.o. daily.  8. Lasix 10 mg p.o. daily.  9. Demeclocycline 150 mg p.o. b.i.d.  10.B complex vitamins 1 p.o. daily.  11.Caltrate with vitamin D 1 p.o. daily.   HOSPITAL COURSE:  Ms. Delahunty was admitted on January 12. She is an 77-year-  old female with a history of stage 1 non-Hodgkin's lymphoma. She had  received chemotherapy with R-CHOP. She had been off Coumadin for about 6  weeks prior to admission.   She was declining at home. She appeared to have some depression. She was  placed on Lexapro. She continued to decline. Her family just did not  know what else to do with her. As such, she was brought to the emergency  room.   On admission, she had a sodium of 122, potassium 2.9. She was started on  IV fluids. I felt that she had some element of  dehydration. She  certainly was malnourished. We got dietary in to see her and they helped  out quite a bit.   Her INR was 5.3 so we held her Coumadin. She was having some bruising.   Dr. Ardyth Harps helped [place] out with the electrolyte problems. We ordered  many tests. Cortisol was fine. TSH was fine. B12 level was fine. Vitamin  D level was okay. She continued to have persistent hyponatremia despite  getting IV fluids. She required constant magnesium replacement. We  ultimately put her on oral magnesium. Her potassium also was on the low  side. She received intermittent IV potassium supplementation.   She had urine and serum osmolality studies done. 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 It was unclear as to why she had SIADH. There certainly  was no obvious evidence of lymphoma. In fact, we put her through a  lumbar puncture. This was done  on December 17, 2006. Clear spinal fluid  was removed. The pathology report ([id number]) showed no malignant  cells.   We did chest x-rays on her. She had no evidence of pneumonia.   Ultimately, we stopped her Lexapro. Psychiatry did see her. They did  provide some help but her overall mental status really did not improve  all that much until after we stopped the Lexapro. Once we stopped the  Lexapro, her sodium began to come up. Also, she had was started on  demeclocycline. This also may been have been a factor in her sodium  improving.   I forgot to mention that she did have E. coli in her urine. This was  sensitive to Rocephin. She was placed on Rocephin for 5 days.   She finally began to improve. On the 25th, sodium 121. From that point  on however, her sodium began to improve slowly but surely. Again, we  stopped her Lexapro and I think this was the key to getting her better.   Renal service did come by to see her. They did make some recommendations  which were very helpful.   We continued to watch her Coumadin levels. She was on a stable  dose of  Coumadin. Pharmacy helped out with this.   She was walking better. Physical therapy was helping out quite a bit.  She was more motivated. Her overall mental status seemed to improve  quite well.   On the first, I felt that she was able to be ready for discharge. Her  family was ready for her to come home. They had made arrangements for  her to be cared for at home.   Upon discharge, her sodium was 133, potassium 4.1, BUN 18, creatinine  0.7. Magnesium was 1.6. Her INR was 2.1.      Rose Phi. Myna Hidalgo, M.D.  Electronically Signed     PRE/MEDQ  D:  12/31/2006  T:  12/31/2006  Job:  [id number]   cc:   Loraine Leriche A. Perini, M.D.  Fax: [phone]

## 2011-04-17 NOTE — Consult Note (Signed)
NAME:  Tasha Ball, Tasha Ball                 ACCOUNT NO.:  [id number]   MEDICAL RECORD NO.:  [id number]          PATIENT TYPE:  INP   LOCATION:                               FACILITY:  [facility]   PHYSICIAN:  Alvin C. Lowell Guitar, M.D.  DATE OF BIRTH:  October 09, 1924   DATE OF CONSULTATION:  12/28/2006  DATE OF DISCHARGE:                                 CONSULTATION   I was asked by Dr. Waynard Edwards to see this 77 year old female with  hyponatremia.  The patient was admitted on December 11, 2006, to [facility] with failure to thrive.  Her past history includes non-  Hodgkin's lymphoma in remission, hypertension, hypothyroidism, and  atrial fibrillation.  Serum sodium on admission was 122 mEq/L, potassium  was 2.9 while taking a thiazide diuretic.  TSH during this  hospitalization was 1.39, cortisol 23.8.  PET scan of the brain was  unremarkable for tumor, and chest x-ray was negative.  The patient was  treated with intravenous fluids, Statin replacement, and has had  persistent hyponatremia, although it had peaked at approximately 130  mEq/L.  Urine sodium on January 25 was 64, urine osmolality was 459, and  serum osmolality was 256.  At that time the serum sodium was 121 mEq/L.  The patient was started on Demeclocycline on January 23.  She reports no  excess consumption of water during this hospitalization but was drinking  a fair amount of water prior to admission but less than one gallon per  day.   CURRENT MEDICATIONS:  Synthroid, Warfarin, Vitamin D, potassium  chloride, Protonix, Demeclocycline 300 mg t.i.d., Magnesium oxide,  Avapro, metoprolol, Norvasc, and normal saline intravenously, currently  reduced to 50 ml per hour.   PAST MEDICAL HISTORY:  As above.   PHYSICAL EXAMINATION:  VITAL SIGNS:  Blood pressure is 133/90,  temperature is 98 degrees.  GENERAL:  She appears comfortable, resting, not tachypneic.  Pleasant,  alert, Caucasian female.  HEENT:  Neck veins are not distended.  LUNGS:  Few crackles bilaterally.  HEART:  Regular rhythm.  ABDOMEN:  Soft.  No hepatosplenomegaly.  EXTREMITIES:  No significant edema.  NEUROLOGIC:  No obvious focality.   LABORATORY DATA:  Sodium 129, potassium 4.1, chloride 95, CO2 28, BUN  10, creatinine 0.6.  Urinalysis:  Specific gravity 1.016.  No  significant cells were present.   ASSESSMENT:  Hyponatremia (currently taking Demeclocycline and  intravenous normal saline).  Etiology of the hyponatremia is uncertain,  however, the urine indices are consistent with SIADH.  Unfortunately the  urine studies were done while on Demeclocycline and also I believe while  receiving intravenous normal saline, which may alter these urine  studies.   RECOMMENDATIONS:  1. Discontinue IV fluids.  2. Continue fluid restrictions.  3. A dose of intravenous Lasix tonight to promote free water diuresis.  4. Continue Demeclocycline for now, but will have low threshold for      stopping it.  5. Keep off thiazides for now.  6. May need to repeat urinary indices while off Demeclocycline.      Mindi Slicker. Lowell Guitar,  M.D.  Electronically Signed     ACP/MEDQ  D:  12/28/2006  T:  12/28/2006  Job:  [id number]   cc:   Loraine Leriche A. Perini, M.D.  Fax: [phone]   Rose Phi. Myna Hidalgo, M.D.  Fax: [phone]

## 2011-04-17 NOTE — H&P (Signed)
NAME:  Tasha Ball, Tasha Ball                 ACCOUNT NO.:  [id number]   MEDICAL RECORD NO.:  [id number]          PATIENT TYPE:  INP   LOCATION:  1340                         FACILITY:  [facility]   PHYSICIAN:  Rose Phi. Myna Hidalgo, M.D. DATE OF BIRTH:  [birth year]   DATE OF ADMISSION:  12/11/2006  DATE OF DISCHARGE:                              HISTORY & PHYSICAL   REASON FOR ADMISSION:  1. Generalized failure to thrive.  2. History of non-Hodgkin's lymphoma - in remission.  3. History of atrial fibrillation.  4. History of hypertension.  5. Hypothyroidism.   HISTORY OF PRESENT ILLNESS:  Ms. Tasha Ball is an 77 year old female who had a  history of localized non-Hodgkin's lymphoma.  She received chemotherapy  with R-CHOP.  She had not had chemo for 5-6 weeks.  She has been in  remission.   She has been declining for the past month.  It is hard to figure out why  this is.  She has been falling.  She has been having depressive  symptoms.  Unfortunately, the family just has been calling me for all of  her problems and it has gotten to the point where she cannot be handled  at home anymore.   She has had MRI of the brain last week which was negative.  She had MRI  of the spine which was negative.  She complained of urinary retention.   She is not eating.  I am wondering if she is even taking her medications  right.   She needs to be admitted because she cannot be taken care of at home  right now.   PAST FAMILY PSYCHIATRIC HISTORY:  See old records.   ALLERGIES:  NONE.   MEDICATIONS:  Inderal/hydrochlorothiazide, __________, hydralazine,  benazepril, potassium, Synthroid, Coumadin, Lexapro, I think Norvasc,  Calcium and Vitamin D, and bethanechol.   SOCIAL HISTORY:  Negative.   PHYSICAL EXAM:  This is a frail appearing white female in no obvious  distress.  VITAL SIGNS:  Temp 96.7, pulse 82, respiratory 18, blood pressure  167/100, oxygen saturation is 98%.  HEAD & NECK EXAM:  Normocephalic,  atraumatic skull.  There is no ocular  or oral lesions.  Oral mucosa slightly dry.  Neck is supple with no  adenopathy.  LUNGS:  Clear bilaterally.  CARDIAC:  A regular rate and a regular rhythm consistent with atrial  fibrillation which is chronic, she has a normal S1, S2.  ABDOMINAL:  Soft, good bowel sounds.  There is no palpable abdominal  mass.  There is no palpable hepatosplenomegaly.  BACK:  No tenderness of the spine or ribs.  EXTREMITIES:  Shows muscle atrophy in upper and lower extremities.  NEUROLOGICAL EXAM:  Shows no obvious neurological deficits.   IMPRESSION:  Ms. Tasha Ball is an 77 year old female with history of localized  non-Hodgkin's lymphoma.  She needs to be admitted because she cannot be  cared for at home right now.  I am not sure as to why this is.  This  certainly cannot be from chemotherapy, in my opinion.  Chemotherapy is  out of her system.  We will check routine labs on her.  Will check thyroid, B12, cortisol.  She needs IV fluids.  She needs IV potassium.   Her prothrombin time is too high.  We will hold her Coumadin right now.   She is going to need a Nutrition consult.  She is going to need a  Physical Therapy consult.  Possibly she is going to need a Psychiatry  consult.  I think depression certainly could be a factor here.      Rose Phi. Myna Hidalgo, M.D.  Electronically Signed     PRE/MEDQ  D:  12/11/2006  T:  12/12/2006  Job:  [id number]

## 2011-04-17 NOTE — Op Note (Signed)
NAME:  Tasha Ball, Tasha Ball                 ACCOUNT NO.:  [id number]   MEDICAL RECORD NO.:  [id number]          PATIENT TYPE:  AMB   LOCATION:  DSC                          FACILITY:  [facility]   PHYSICIAN:  Jefry H. Pollyann Kennedy, MD     DATE OF BIRTH:  1924/03/12   DATE OF PROCEDURE:  07/21/2006  DATE OF DISCHARGE:                                 OPERATIVE REPORT   PREOPERATIVE DIAGNOSIS:  Asymmetrically enlarged tonsil with possible tonsil  mass.   POSTOPERATIVE DIAGNOSIS:  Asymmetrically enlarged tonsil with possible  tonsil mass.   PROCEDURE:  Left tonsillectomy.   SURGEON:  Dr. Pollyann Kennedy.   General endotracheal anesthesia was used.  No complications.   BLOOD LOSS:  Minimal.   FINDINGS:  Very large tonsil with an encapsulated mass, left side; firm and  rubbery, without any superficial ulceration.  No complications.   HISTORY:  This is an 77 year old lady with a couple month history of an  enlarging left tonsil, despite antibiotic therapy.  Risks, benefits,  alternatives, complications of procedure were explained to the patient who  seemed to understand and agreed to surgery.   PROCEDURE:  The patient was taken to the operating room and placed on the  operating table in supine position.  Following induction of general  endotracheal anesthesia, the table was turned and the patient was draped in  standard fashion.  Crowe-Davis mouth gag was inserted into the oral cavity,  used to retract the tongue and mandible, and attached to Mayo stand.  Red  rubber catheter was inserted into the right side of nose, withdrawn through  the mouth and used to retract the soft palate uvula.  The left tonsillectomy  was performed using electrocautery dissection, carefully dissecting the  avascular plane between the capsule and the constrictor muscles.  Multiple  bleeders were encountered and they were coagulated with the standard cautery  and with suction cautery.  The entire tonsil, with its contained mass, was  sent fresh for pathologic evaluation and lymphoma workup.  Touch-up spot  cautery was used for completion of hemostasis.  The pharynx was suctioned of  blood and secretions, irrigated with saline solution, and an orogastric tube  was used to aspirate the contents of the stomach.  The patient was awakened,  extubated and transferred to recovery in stable condition.      Jefry H. Pollyann Kennedy, MD  Electronically Signed     JHR/MEDQ  D:  07/21/2006  T:  07/21/2006  Job:  [id number]

## 2011-06-16 ENCOUNTER — Other Ambulatory Visit: Payer: Self-pay | Admitting: Internal Medicine

## 2011-06-16 DIAGNOSIS — Z1231 Encounter for screening mammogram for malignant neoplasm of breast: Secondary | ICD-10-CM

## 2011-07-10 ENCOUNTER — Ambulatory Visit
Admission: RE | Admit: 2011-07-10 | Discharge: 2011-07-10 | Disposition: A | Discharge status: Home / Self Care | Payer: Medicare Other | Source: Ambulatory Visit | Attending: Internal Medicine | Admitting: Internal Medicine

## 2011-07-10 DIAGNOSIS — Z1231 Encounter for screening mammogram for malignant neoplasm of breast: Secondary | ICD-10-CM

## 2011-08-31 LAB — GLUCOSE, CAPILLARY: Glucose-Capillary: 99

## 2011-10-16 ENCOUNTER — Emergency Department (HOSPITAL_COMMUNITY): Payer: Medicare Other

## 2011-10-16 ENCOUNTER — Inpatient Hospital Stay (HOSPITAL_COMMUNITY)
Admission: EM | Admit: 2011-10-16 | Discharge: 2011-10-26 | DRG: 470 | Disposition: A | Discharge status: Rehabilitation Facility | Payer: Medicare Other | Attending: Internal Medicine | Admitting: Internal Medicine

## 2011-10-16 ENCOUNTER — Other Ambulatory Visit: Payer: Self-pay

## 2011-10-16 ENCOUNTER — Encounter: Payer: Self-pay | Admitting: Internal Medicine

## 2011-10-16 DIAGNOSIS — I4891 Unspecified atrial fibrillation: Secondary | ICD-10-CM | POA: Diagnosis present

## 2011-10-16 DIAGNOSIS — Z22322 Carrier or suspected carrier of Methicillin resistant Staphylococcus aureus: Secondary | ICD-10-CM

## 2011-10-16 DIAGNOSIS — S72009A Fracture of unspecified part of neck of unspecified femur, initial encounter for closed fracture: Secondary | ICD-10-CM | POA: Insufficient documentation

## 2011-10-16 DIAGNOSIS — Z87891 Personal history of nicotine dependence: Secondary | ICD-10-CM

## 2011-10-16 DIAGNOSIS — F0390 Unspecified dementia without behavioral disturbance: Secondary | ICD-10-CM | POA: Diagnosis present

## 2011-10-16 DIAGNOSIS — S72002A Fracture of unspecified part of neck of left femur, initial encounter for closed fracture: Secondary | ICD-10-CM

## 2011-10-16 DIAGNOSIS — Z7901 Long term (current) use of anticoagulants: Secondary | ICD-10-CM

## 2011-10-16 DIAGNOSIS — Y92009 Unspecified place in unspecified non-institutional (private) residence as the place of occurrence of the external cause: Secondary | ICD-10-CM

## 2011-10-16 DIAGNOSIS — N390 Urinary tract infection, site not specified: Secondary | ICD-10-CM | POA: Diagnosis present

## 2011-10-16 DIAGNOSIS — R413 Other amnesia: Secondary | ICD-10-CM | POA: Diagnosis present

## 2011-10-16 DIAGNOSIS — W010XXA Fall on same level from slipping, tripping and stumbling without subsequent striking against object, initial encounter: Secondary | ICD-10-CM | POA: Diagnosis present

## 2011-10-16 DIAGNOSIS — M810 Age-related osteoporosis without current pathological fracture: Secondary | ICD-10-CM | POA: Diagnosis present

## 2011-10-16 DIAGNOSIS — I10 Essential (primary) hypertension: Secondary | ICD-10-CM | POA: Diagnosis present

## 2011-10-16 DIAGNOSIS — S72012A Unspecified intracapsular fracture of left femur, initial encounter for closed fracture: Secondary | ICD-10-CM

## 2011-10-16 DIAGNOSIS — E039 Hypothyroidism, unspecified: Secondary | ICD-10-CM | POA: Diagnosis present

## 2011-10-16 DIAGNOSIS — F3289 Other specified depressive episodes: Secondary | ICD-10-CM | POA: Diagnosis present

## 2011-10-16 DIAGNOSIS — Z87898 Personal history of other specified conditions: Secondary | ICD-10-CM

## 2011-10-16 DIAGNOSIS — F329 Major depressive disorder, single episode, unspecified: Secondary | ICD-10-CM | POA: Diagnosis present

## 2011-10-16 DIAGNOSIS — F068 Other specified mental disorders due to known physiological condition: Secondary | ICD-10-CM | POA: Diagnosis present

## 2011-10-16 HISTORY — DX: Fracture of unspecified part of neck of left femur, initial encounter for closed fracture: S72.002A

## 2011-10-16 HISTORY — DX: Non-Hodgkin lymphoma, unspecified, in remission: C85.9A

## 2011-10-16 HISTORY — DX: Unspecified atrial fibrillation: I48.91

## 2011-10-16 HISTORY — DX: Non-Hodgkin lymphoma, unspecified, unspecified site: C85.90

## 2011-10-16 HISTORY — DX: Unspecified dementia, unspecified severity, without behavioral disturbance, psychotic disturbance, mood disturbance, and anxiety: F03.90

## 2011-10-16 HISTORY — DX: Urinary tract infection, site not specified: N39.0

## 2011-10-16 HISTORY — DX: Malignant (primary) neoplasm, unspecified: C80.1

## 2011-10-16 LAB — DIFFERENTIAL
Lymphocytes Relative: 12 % (ref 12–46)
Lymphs Abs: 1.1 10*3/uL (ref 0.7–4.0)
Neutrophils Relative %: 79 % — ABNORMAL HIGH (ref 43–77)

## 2011-10-16 LAB — URINALYSIS, ROUTINE W REFLEX MICROSCOPIC
Glucose, UA: NEGATIVE mg/dL
Hgb urine dipstick: NEGATIVE
Ketones, ur: NEGATIVE mg/dL
Leukocytes, UA: NEGATIVE
pH: 5.5 (ref 5.0–8.0)

## 2011-10-16 LAB — CARDIAC PANEL(CRET KIN+CKTOT+MB+TROPI)
CK, MB: 4.4 ng/mL — ABNORMAL HIGH (ref 0.3–4.0)
Relative Index: 3.3 — ABNORMAL HIGH (ref 0.0–2.5)
Troponin I: <0.3 ng/mL (ref <0.30)

## 2011-10-16 LAB — BASIC METABOLIC PANEL
CO2: 30 mEq/L (ref 19–32)
GFR calc non Af Amer: 57 mL/min — ABNORMAL LOW (ref >90)
Glucose, Bld: 114 mg/dL — ABNORMAL HIGH (ref 70–99)
Potassium: 3.6 mEq/L (ref 3.5–5.1)
Sodium: 139 mEq/L (ref 135–145)

## 2011-10-16 LAB — APTT: aPTT: 28 seconds (ref 24–37)

## 2011-10-16 LAB — URINE MICROSCOPIC-ADD ON

## 2011-10-16 LAB — CBC
Platelets: 147 10*3/uL — ABNORMAL LOW (ref 150–400)
RBC: 3.96 MIL/uL (ref 3.87–5.11)
WBC: 9.7 10*3/uL (ref 4.0–10.5)

## 2011-10-16 LAB — PROTIME-INR: INR: 1.56 — ABNORMAL HIGH (ref 0.00–1.49)

## 2011-10-16 MED ORDER — ONDANSETRON HCL 4 MG PO TABS: 4.0000 mg | ORAL_TABLET | Freq: Four times a day (QID) | ORAL | Status: DC | PRN

## 2011-10-16 MED ORDER — ONDANSETRON HCL 4 MG/2ML IJ SOLN: 4.0000 mg | Freq: Four times a day (QID) | INTRAMUSCULAR | Status: DC | PRN

## 2011-10-16 MED ORDER — DOCUSATE SODIUM 100 MG PO CAPS
100.0000 mg | ORAL_CAPSULE | Freq: Two times a day (BID) | ORAL | Status: DC
  Administered 2011-10-17 – 2011-10-20 (×5): 100 mg via ORAL
  Filled 2011-10-16 (×11): qty 1

## 2011-10-16 MED ORDER — VITAMIN D (ERGOCALCIFEROL) 1.25 MG (50000 UNIT) PO CAPS
50000.0000 [IU] | ORAL_CAPSULE | ORAL | Status: DC
  Administered 2011-10-17 – 2011-10-23 (×2): 50000 [IU] via ORAL
  Filled 2011-10-16 (×2): qty 1

## 2011-10-16 MED ORDER — ACETAMINOPHEN 650 MG RE SUPP: 650.0000 mg | Freq: Four times a day (QID) | RECTAL | Status: DC | PRN

## 2011-10-16 MED ORDER — SODIUM CHLORIDE 0.9 % IJ SOLN
3.0000 mL | Freq: Two times a day (BID) | INTRAMUSCULAR | Status: DC
  Administered 2011-10-17: 3 mL via INTRAVENOUS

## 2011-10-16 MED ORDER — METOPROLOL TARTRATE 50 MG PO TABS
50.0000 mg | ORAL_TABLET | Freq: Two times a day (BID) | ORAL | Status: DC
  Administered 2011-10-17 (×3): 50 mg via ORAL
  Filled 2011-10-16 (×7): qty 1

## 2011-10-16 MED ORDER — FENTANYL CITRATE 0.05 MG/ML IJ SOLN
50.0000 ug | INTRAMUSCULAR | Status: DC | PRN
  Administered 2011-10-16 (×3): 50 ug via INTRAVENOUS
  Filled 2011-10-16 (×3): qty 2

## 2011-10-16 MED ORDER — HEPARIN SODIUM (PORCINE) 5000 UNIT/ML IJ SOLN
5000.0000 [IU] | Freq: Three times a day (TID) | INTRAMUSCULAR | Status: DC
  Administered 2011-10-17 (×4): 5000 [IU] via SUBCUTANEOUS
  Filled 2011-10-16 (×8): qty 1

## 2011-10-16 MED ORDER — ESCITALOPRAM OXALATE 5 MG PO TABS
5.0000 mg | ORAL_TABLET | Freq: Every day | ORAL | Status: DC
  Administered 2011-10-17 – 2011-10-26 (×9): 5 mg via ORAL
  Filled 2011-10-16 (×10): qty 1

## 2011-10-16 MED ORDER — HYDROCODONE-ACETAMINOPHEN 5-325 MG PO TABS
1.0000 | ORAL_TABLET | ORAL | Status: DC | PRN
  Administered 2011-10-17 (×2): 1 via ORAL
  Filled 2011-10-16 (×2): qty 1

## 2011-10-16 MED ORDER — ACETAMINOPHEN 325 MG PO TABS: 650.0000 mg | ORAL_TABLET | Freq: Four times a day (QID) | ORAL | Status: DC | PRN

## 2011-10-16 MED ORDER — LEVOTHYROXINE SODIUM 75 MCG PO TABS
75.0000 ug | ORAL_TABLET | Freq: Every day | ORAL | Status: DC
  Administered 2011-10-17 – 2011-10-26 (×9): 75 ug via ORAL
  Filled 2011-10-16 (×10): qty 1

## 2011-10-16 MED ORDER — PHYTONADIONE 5 MG PO TABS
2.5000 mg | ORAL_TABLET | Freq: Once | ORAL | Status: AC
  Administered 2011-10-16: 2.5 mg via ORAL
  Filled 2011-10-16: qty 1

## 2011-10-16 MED ORDER — AMLODIPINE BESYLATE 5 MG PO TABS
5.0000 mg | ORAL_TABLET | Freq: Every day | ORAL | Status: DC
  Filled 2011-10-16 (×3): qty 1

## 2011-10-16 MED ORDER — CIPROFLOXACIN HCL 250 MG PO TABS
250.0000 mg | ORAL_TABLET | Freq: Two times a day (BID) | ORAL | Status: DC
  Administered 2011-10-17 – 2011-10-20 (×8): 250 mg via ORAL
  Filled 2011-10-16 (×11): qty 1

## 2011-10-16 MED ORDER — LOSARTAN POTASSIUM 50 MG PO TABS
50.0000 mg | ORAL_TABLET | Freq: Every day | ORAL | Status: DC
  Administered 2011-10-19 – 2011-10-26 (×8): 50 mg via ORAL
  Filled 2011-10-16 (×10): qty 1

## 2011-10-16 MED ORDER — MEMANTINE HCL 10 MG PO TABS
10.0000 mg | ORAL_TABLET | Freq: Two times a day (BID) | ORAL | Status: DC
  Administered 2011-10-17 – 2011-10-26 (×19): 10 mg via ORAL
  Filled 2011-10-16 (×21): qty 1

## 2011-10-16 MED ORDER — DONEPEZIL HCL 23 MG PO TABS
23.0000 mg | ORAL_TABLET | Freq: Every day | ORAL | Status: DC
  Administered 2011-10-17 – 2011-10-25 (×9): 23 mg via ORAL
  Filled 2011-10-16 (×10): qty 1

## 2011-10-16 MED ORDER — SENNA 8.6 MG PO TABS: 2.0000 | ORAL_TABLET | Freq: Every day | ORAL | Status: DC | PRN

## 2011-10-16 MED ORDER — LIP MEDEX EX OINT
TOPICAL_OINTMENT | CUTANEOUS | Status: DC | PRN
  Filled 2011-10-16: qty 7

## 2011-10-16 MED FILL — Cold Sore Products - Ointment: CUTANEOUS | Qty: 7 | Status: AC

## 2011-10-16 NOTE — ED Notes (Signed)
Per son, pt was walking outside her house to talk w/son, when pt stepped into a hole in the yard causing her to loose her balance and fall, landing on her L hip. Son sts he is renovating pt's house so him and his spouse can move in w/pt d/t becoming more confused than normal, pt has a hx of dementia. L leg shortening and rotation noted

## 2011-10-16 NOTE — ED Provider Notes (Addendum)
History     CSN: [id number] Arrival date & time: 10/16/2011 11:50 AM   First MD Initiated Contact with Patient 10/16/11 1159      Chief Complaint  Patient presents with  . Fall    (Consider location/radiation/quality/duration/timing/severity/associated sxs/prior treatment) Patient is a 77 y.o. female presenting with fall. The history is provided by the patient.  Fall  She was walking in her yard and stepped into a depression and fell injuring her left hip. She denies other injury. Currently pain is rated 6/10, maximum pain was 9/10. She was not able to ambulate after the fall. She was transported via ambulance where she got fentanyl for pain relief. She is anticoagulated on Coumadin for atrial fibrillation.  No past medical history on file.  No past surgical history on file.  No family history on file.  History  Substance Use Topics  . Smoking status: Not on file  . Smokeless tobacco: Not on file  . Alcohol Use: Not on file    OB History    No data available      Review of Systems  All other systems reviewed and are negative.    Allergies  Review of patient's allergies indicates not on file.  Home Medications  No current outpatient prescriptions on file.  BP 109/66  Pulse 64  Temp(Src) 97.8 F (36.6 C) (Oral)  Resp 18  SpO2 95%  Physical Exam  Nursing note and vitals reviewed.  77 year old female who is resting comfortably and in no acute distress. Vital signs are normal. PERR LA, EOMI. Oropharynx clear. Head is normocephalic atraumatic. Neck is nontender without adenopathy or JVD. Back is nontender there is no CVA tenderness. Lungs are clear without rales, wheezes, or rhonchi. Heart has regular rate and rhythm without murmur. Abdomen is soft and nontender without any masses and peristalsis is present. Extremities: Left leg is shortened and externally rotated. There is pain with internal and external rotation and there is moderate tenderness palpation over  the left hip. No other extremity injuries seen. Distal neurovascular examination is intact. Neurologic: Mental status is normal, cranial nerves are intact, there are no motor or sensory deficits. Psychiatric: No abnormalities of mood or affect.  ED Course  Procedures (including critical care time)  Labs Reviewed - No data to display No results found.   No diagnosis found.  Results for orders placed during the hospital encounter of 10/16/11  CBC      Component Value Range   WBC 9.7  4.0 - 10.5 (K/uL)   RBC 3.96  3.87 - 5.11 (MIL/uL)   Hemoglobin 13.0  12.0 - 15.0 (g/dL)   HCT 40.9  81.1 - 91.4 (%)   MCV 98.2  78.0 - 100.0 (fL)   MCH 32.8  26.0 - 34.0 (pg)   MCHC 33.4  30.0 - 36.0 (g/dL)   RDW 78.2  95.6 - 21.3 (%)   Platelets 147 (*) 150 - 400 (K/uL)  DIFFERENTIAL      Component Value Range   Neutrophils Relative 79 (*) 43 - 77 (%)   Neutro Abs 7.7  1.7 - 7.7 (K/uL)   Lymphocytes Relative 12  12 - 46 (%)   Lymphs Abs 1.1  0.7 - 4.0 (K/uL)   Monocytes Relative 8  3 - 12 (%)   Monocytes Absolute 0.8  0.1 - 1.0 (K/uL)   Eosinophils Relative 1  0 - 5 (%)   Eosinophils Absolute 0.1  0.0 - 0.7 (K/uL)   Basophils Relative 0  0 -  1 (%)   Basophils Absolute 0.0  0.0 - 0.1 (K/uL)  BASIC METABOLIC PANEL      Component Value Range   Sodium 139  135 - 145 (mEq/L)   Potassium 3.6  3.5 - 5.1 (mEq/L)   Chloride 101  96 - 112 (mEq/L)   CO2 30  19 - 32 (mEq/L)   Glucose, Bld 114 (*) 70 - 99 (mg/dL)   BUN 20  6 - 23 (mg/dL)   Creatinine, Ser 1.61  0.50 - 1.10 (mg/dL)   Calcium 8.7  8.4 - 09.6 (mg/dL)   GFR calc non Af Amer 57 (*) >90 (mL/min)   GFR calc Af Amer 66 (*) >90 (mL/min)  PROTIME-INR      Component Value Range   Prothrombin Time 19.0 (*) 11.6 - 15.2 (seconds)   INR 1.56 (*) 0.00 - 1.49   APTT      Component Value Range   aPTT 28  24 - 37 (seconds)  URINALYSIS, ROUTINE W REFLEX MICROSCOPIC      Component Value Range   Color, Urine YELLOW  YELLOW    Appearance CLEAR   CLEAR    Specific Gravity, Urine 1.023  1.005 - 1.030    pH 5.5  5.0 - 8.0    Glucose, UA NEGATIVE  NEGATIVE (mg/dL)   Hgb urine dipstick NEGATIVE  NEGATIVE    Bilirubin Urine NEGATIVE  NEGATIVE    Ketones, ur NEGATIVE  NEGATIVE (mg/dL)   Protein, ur 30 (*) NEGATIVE (mg/dL)   Urobilinogen, UA 1.0  0.0 - 1.0 (mg/dL)   Nitrite NEGATIVE  NEGATIVE    Leukocytes, UA NEGATIVE  NEGATIVE   TYPE AND SCREEN      Component Value Range   ABO/RH(D) A POS     Antibody Screen NEG     Sample Expiration 10/19/2011    URINE MICROSCOPIC-ADD ON      Component Value Range   RBC / HPF 0-2  <3 (RBC/hpf)   Casts HYALINE CASTS (*) NEGATIVE    Urine-Other MUCOUS PRESENT     Results for orders placed during the hospital encounter of 10/16/11  CBC      Component Value Range   WBC 9.7  4.0 - 10.5 (K/uL)   RBC 3.96  3.87 - 5.11 (MIL/uL)   Hemoglobin 13.0  12.0 - 15.0 (g/dL)   HCT 04.5  40.9 - 81.1 (%)   MCV 98.2  78.0 - 100.0 (fL)   MCH 32.8  26.0 - 34.0 (pg)   MCHC 33.4  30.0 - 36.0 (g/dL)   RDW 91.4  78.2 - 95.6 (%)   Platelets 147 (*) 150 - 400 (K/uL)  DIFFERENTIAL      Component Value Range   Neutrophils Relative 79 (*) 43 - 77 (%)   Neutro Abs 7.7  1.7 - 7.7 (K/uL)   Lymphocytes Relative 12  12 - 46 (%)   Lymphs Abs 1.1  0.7 - 4.0 (K/uL)   Monocytes Relative 8  3 - 12 (%)   Monocytes Absolute 0.8  0.1 - 1.0 (K/uL)   Eosinophils Relative 1  0 - 5 (%)   Eosinophils Absolute 0.1  0.0 - 0.7 (K/uL)   Basophils Relative 0  0 - 1 (%)   Basophils Absolute 0.0  0.0 - 0.1 (K/uL)  BASIC METABOLIC PANEL      Component Value Range   Sodium 139  135 - 145 (mEq/L)   Potassium 3.6  3.5 - 5.1 (mEq/L)   Chloride 101  96 - 112 (mEq/L)   CO2 30  19 - 32 (mEq/L)   Glucose, Bld 114 (*) 70 - 99 (mg/dL)   BUN 20  6 - 23 (mg/dL)   Creatinine, Ser 9.81  0.50 - 1.10 (mg/dL)   Calcium 8.7  8.4 - 19.1 (mg/dL)   GFR calc non Af Amer 57 (*) >90 (mL/min)   GFR calc Af Amer 66 (*) >90 (mL/min)  PROTIME-INR       Component Value Range   Prothrombin Time 19.0 (*) 11.6 - 15.2 (seconds)   INR 1.56 (*) 0.00 - 1.49   APTT      Component Value Range   aPTT 28  24 - 37 (seconds)  URINALYSIS, ROUTINE W REFLEX MICROSCOPIC      Component Value Range   Color, Urine YELLOW  YELLOW    Appearance CLEAR  CLEAR    Specific Gravity, Urine 1.023  1.005 - 1.030    pH 5.5  5.0 - 8.0    Glucose, UA NEGATIVE  NEGATIVE (mg/dL)   Hgb urine dipstick NEGATIVE  NEGATIVE    Bilirubin Urine NEGATIVE  NEGATIVE    Ketones, ur NEGATIVE  NEGATIVE (mg/dL)   Protein, ur 30 (*) NEGATIVE (mg/dL)   Urobilinogen, UA 1.0  0.0 - 1.0 (mg/dL)   Nitrite NEGATIVE  NEGATIVE    Leukocytes, UA NEGATIVE  NEGATIVE   TYPE AND SCREEN      Component Value Range   ABO/RH(D) A POS     Antibody Screen NEG     Sample Expiration 10/19/2011    URINE MICROSCOPIC-ADD ON      Component Value Range   RBC / HPF 0-2  <3 (RBC/hpf)   Casts HYALINE CASTS (*) NEGATIVE    Urine-Other MUCOUS PRESENT     Dg Chest 1 View  10/16/2011  *RADIOLOGY REPORT*  Clinical Data: Preoperative respiratory evaluation prior to ORIF left femoral neck fracture.  History of lymphoma.  CHEST - 1 VIEW 10/16/2011:  Comparison: Two-view chest x-ray 12/11/2006 [facility]. Multiple interval PET CTs.  Findings: Cardiac silhouette markedly enlarged, increased in size since the prior examination.  Mild pulmonary venous hypertension without overt edema.  Minimal linear atelectasis at the left lung base.  Lungs otherwise clear.  No visible pleural effusions.  IMPRESSION: Marked cardiomegaly without pulmonary edema, with interval increase in heart size since January, 2008.  Minimal atelectasis at the left lung base.  No acute cardiopulmonary disease otherwise.  Original Report Authenticated By: Arnell Sieving, M.D.   Dg Hip Complete Left  10/16/2011  *RADIOLOGY REPORT*  Clinical Data: Fall, left hip pain  LEFT HIP - COMPLETE 2+ VIEW  Comparison: None.  Findings: There is an  acute fracture of the left femoral neck.  No displaced pelvic fracture is identified.  Normal visualized bowel gas pattern.  Femoral heads are appropriately located on provided views.  IMPRESSION: Acute left femoral neck fracture. Per CMS PQRS reporting requirements (PQRS Measure 24): Given the patient's age of greater than 50 and the fracture site (hip, distal radius, or spine), the patient should be tested for osteoporosis using DXA, and the appropriate treatment considered based on the DXA results.  Original Report Authenticated By: Harrel Lemon, M.D.   Ct Head Wo Contrast  10/16/2011  *RADIOLOGY REPORT*  Clinical Data: Status post fall.  CT HEAD WITHOUT CONTRAST  Technique:  Contiguous axial images were obtained from the base of the skull through the vertex without contrast.  Comparison: Brain MRI 11/11/2010.  Findings: There is cortical atrophy with patchy confluent hypoattenuation in the subcortical and periventricular deep white matter.  No evidence of acute abnormality including acute infarction, hemorrhage, mass lesion, mass effect, midline shift or abnormal extra-axial fluid collection.  Extensive subcutaneous air is present.  Small amount of air is seen in the left and cavernous sinuses, greater on the left. Tiny air fluid level left sphenoid sinus noted.  IMPRESSION:  1.  No acute intracranial abnormality with atrophy and chronic microvascular ischemic change noted. 2.  Extensive subcutaneous air.  Question chest or facial injury. Critical Value/emergent results were called by telephone at the time of interpretation on 10/16/2011  at 1:30 p.m.  to  Dr. Preston Fleeting, who verbally acknowledged these results.  Original Report Authenticated By: Bernadene Bell. Maricela Curet, M.D.   Ct Soft Tissue Neck Wo Contrast  10/16/2011  *RADIOLOGY REPORT*  Clinical Data: Head trauma.  Subcutaneous air at the skull base. Evaluate source.  CT NECK WITHOUT CONTRAST  Technique:  Multidetector CT imaging of the neck was  performed without intravenous contrast.  Comparison: CT head 10/16/2011.  Findings: Severe cervical spondylosis with facet mediated slip C2-3 and C3-4. No visible cervical spine fracture.  Advanced vascular calcification most notable involving the  transverse aortic arch. No pneumothorax.   The areas which were noted to previously contain air in the region of the skull base and infratemporal fossa no longer show this abnormality.  The only air which remains in the neck is located intravascular, within the right and left internal jugular veins. Some extrapleural air is also noted in the chest, near the innominate-SVC confluence on the right.  I believe the observed abnormality on CT head is due to introduction of air during IV placement.  Airway midline and widely patent.   Dental amalgam obscures much of the pharyngeal region but there are no definite mucosal lesions or significant adenopathy within limits of noncontrast technique. Normal epiglottis.  Grossly clear sinuses and mastoids. No visible lesion of the major or minor salivary glands.  IMPRESSION: Partial resolution of the previously identified neck emphysema. The only extrathoracic air which remains is intravascular, suggesting the previously identified air bubbles were introduced during IV placement.  Advanced cervical spondylosis without visible acute fracture or traumatic subluxation.  Original Report Authenticated By: Elsie Stain, M.D.         Date: 10/16/2011  Rate: 51  Rhythm: atrial fibrillation  QRS Axis: normal  Intervals: normal  ST/T Wave abnormalities: nonspecific ST/T changes  Conduction Disutrbances:nonspecific intraventricular conduction delay  Narrative Interpretation: Atrial fibrillation with nonspecific ST and T changes which are slightly more prominent than on ECG from 12/11/2006  Old EKG Reviewed: unchanged  Case has been discussed with [name] see her from an orthopedic standpoint. Case is also discussed with Dr.  Timothy Lasso who will admit the patient. I have also discussed the radiology findings with the radiologist. Subcutaneous air in the CT of the head is of uncertain significance. She shows no evidence of any trauma, but CT of the neck will be obtained to make sure there is no significant underlying problem.  MDM  Probable hip fracture.        Dione Booze, MD 10/16/11 1458  Dione Booze, MD 10/16/11 [phone]

## 2011-10-16 NOTE — ED Notes (Signed)
Patient placed on bedpan, was unable to have BM.

## 2011-10-16 NOTE — ED Notes (Addendum)
Per EMS, pt from home, pt fell approx 1 hr ago, pt walking outside of house stepped in a hole, fell, landing on L side, pt c/o L hip pain, shortening, no rotation noted, denies LOC or hitting her head, pt is on coumadin. Pt rates pain 6/10, 20g LFA, Fentanyl 100 mcg IVP given en route

## 2011-10-16 NOTE — H&P (Addendum)
PCP:  Ezequiel Kayser, MD, MD  Chief Complaint:  L Hip fracture  HPI:  19 F with Dementia and recent UTI on Cip[place].  Just did OutPt PT. Was checking out the addition in her sons house when she tripped and subsequently fell to the ground. She had instant pain and they called EMS and broght to the ED for Eval and Rx.  Dxed with L Hip Fx. B/c of coumadin her surgery is on hold till Sunday - INR 1.5 Given Vit K in Ed. I stopped the FFP as she did not need it.  She is currently in the TCU and pain is controlled.  Dr Charlann Boxer asked [place] to admit due to her MMP.   At 09/18/11 OV with Dr Waynard Edwards they discussed her bowels and that her son and Arline Asp has moved in with her since July.  they did build up the namenda to full dose.  every other wed she volunteers at church. Nedra Hai takes her to church ( [employer]) every sunday.  PT was set up after that OV  INR 2.4 on 10/19  Just had APE labs for Upcoming Physical and UTI noted and treated with cipro 250 mg po bid #10 days.  take the coumadin every other day for next 4 days then resume normal dose (basically, skip two doses in next four days).  take align( over the counter) one daily for 2 weeks as a probiotic  Rest of labs were fine.  T Bili 2.3    Review of Systems:  The patient denies anorexia, fever, weight loss,, vision loss, decreased hearing, hoarseness, chest pain, syncope, dyspnea on exertion, peripheral edema, balance deficits, hemoptysis, abdominal pain, melena, hematochezia, severe indigestion/heartburn, hematuria, incontinence, genital sores, muscle weakness, suspicious skin lesions, transient blindness, difficulty walking, depression, unusual weight change, abnormal bleeding, enlarged lymph nodes, angioedema, and breast masses.   Past Medical History:  Past Medical History   Diagnosis  Date   .  Urinary tract infection    .  Cancer      non-hodgkins lymphoma   .  Lymphoma in remission      ca free 5 yrs   .  Dementia    .   Atrial fibrillation     Past Surgical History   Procedure  Date   .  Eye surgery      bil.cataract with lens implants    Past History  Past Medical History:  Dementia/memory loss  Hypertension,  L ovarian cyst,  Hypothyroidism,  mild GERD,  Hiatal hernia,  Osteoporosis,  AR, seasonal  Anxiety,  A-fib, permanent  Vit D insufficiency,  Cardiomegaly,  Syndrome of inappropriate antidiuretic hormone secretion,  Non-Hodgkin's lymphoma (L tonsillar area), s/p surgery and chemotherapy (no xrt)  Depression  hearing loss 50% as of 10/12  Bowel incontinence  Herpes Zoster 03/2011  Dr. Elease Hashimoto cards  Dr. Myna Hidalgo onco  Dr. Marlowe Shores  Dr. eye has one  Dr. Jamison Oka  [place] [facility].  DR. Alycia Rossetti audiology   Surgical History (reviewed - no changes required): C-section x2    Complete Medication List:  1) B Complex Tabs (B complex vitamins) .... Take one tab, po, once a day  2) Coumadin Tabs (Warfarin sodium tabs) .... Take 1/2 tablet daily except 1 tablet on mondays and fridays  3) Fosamax 70 Mg Tabs (Alendronate sodium) .... Take one tab, po, once a week  4) Levoxyl 75 Mcg Tabs (Levothyroxine sodium) .... Tale one tab, po, once a day  5) Metoprolol  Tartrate 50 Mg Tabs (Metoprolol tartrate) .... One bid  6) Vitamin D (ergocalciferol) 50000 Unit Caps (Ergocalciferol) .... Take one tablet once weekly  7) Norvasc 5 Mg Tabs (Amlodipine besylate) .... Take 1 tablet once daily  8) Aricept 23 Mg Tabs (Donepezil hcl) .... Take one tablet by mouth daily  9) Losartan Potassium 100 Mg Tabs (Losartan potassium) .... Take 1/2 tablet once daily  10) Lexapro 10 Mg Tabs (Escitalopram oxalate) .... One po daily  11) Namenda 10 Mg Tabs (Memantine hcl) .... One po bid  12) Cipro 250 Mg Tabs (Ciprofloxacin hcl) .... One po bid    Medications:  Prior to Admission medications   Medication  Sig  Start Date  End Date  Taking?  Authorizing Provider   alendronate (FOSAMAX) 70 MG tablet  Take 70 mg by  mouth every 7 (seven) days. Take with a full glass of water on an empty stomach.     Historical Provider, MD   amLODipine (NORVASC) 5 MG tablet  Take 5 mg by mouth daily.     Historical Provider, MD   ciprofloxacin (CIPRO) 250 MG tablet  Take 250 mg by mouth 2 (two) times daily.     Historical Provider, MD   donepezil (ARICEPT) 23 MG TABS tablet  Take 23 mg by mouth at bedtime.     Historical Provider, MD   escitalopram (LEXAPRO) 10 MG tablet  Take 5 mg by mouth daily.     Historical Provider, MD   levothyroxine (SYNTHROID, LEVOTHROID) 75 MCG tablet  Take 75 mcg by mouth daily.     Historical Provider, MD   losartan (COZAAR) 100 MG tablet  Take 50 mg by mouth daily.     Historical Provider, MD   memantine (NAMENDA) 10 MG tablet  Take 10 mg by mouth 2 (two) times daily.     Historical Provider, MD   metoprolol (LOPRESSOR) 50 MG tablet  Take 50 mg by mouth 2 (two) times daily.     Historical Provider, MD   Vitamin D, Ergocalciferol, (DRISDOL) 50000 UNITS CAPS  Take 50,000 Units by mouth every 7 (seven) days.     Historical Provider, MD   warfarin (COUMADIN) 5 MG tablet  Take 2.5-5 mg by mouth daily. 0.5 tab daily except 1 tab on Mondays and Fridays     Historical Provider, MD    Allergies:  No Known Allergies/galantamine   Social History:  reports that she has quit smoking. She has never used smokeless tobacco. She reports that she does not drink alcohol or use illicit drugs.  Ms. Sandquist is widowed and has 3 children Fransisca Kaufmann (son) [phone] and Luisa Hart and 8 grandchildren (4 boys and 4 girls). 2 GGC. She volunteers at [facility]. She denies tobacco and illicit drug use. She states that she occationally consumes wine.   Family History:  Father: Deceased at age 71 due to MI  Mother: Deceased at age 52 due to complications of falling and fx hip  2 Sister: one Living. Carney Bern died age 54 in 2023/05/04 of colon ca and other issues heart and Roderic Scarce is living.  4 Brothers: 3 Living (1 w/ CABG), 1  deceased due to stomach cancer  3 Children Pat son, Nedra Hai son (they are identical boy twins) and Beth   Physical Exam:  There were no vitals filed for this visit.  General appearance: Alert looks younger than age.  Dementia. Head: Normocephalic, without obvious abnormality, atraumatic  Eyes: conjunctivae/corneas clear. PERRL, EOM's intact.  Nose: Nares normal. Septum midline. Mucosa normal. No drainage or sinus tenderness.  Throat: lips, mucosa, and tongue normal; teeth and gums normal  Neck: no adenopathy, no carotid bruit, no JVD and thyroid not enlarged, symmetric, no tenderness/mass/nodules  Resp: CTA B Cardio: Irreg Irreg GI: soft, non-tender; bowel sounds normal; no masses, no organomegaly  Extremities: L Hip rotated Pulses: 1+ and symmetric  Lymph nodes: Cervical adenopathy: no cervical lymphadenopathy    Labs on Admission:   Basename  10/16/11 1308   NA  139   K  3.6   CL  101   CO2  30   GLUCOSE  114*   BUN  20   CREATININE  0.90   CALCIUM  8.7   MG  --   PHOS  --    No results found for this basename: AST:2,ALT:2,ALKPHOS:2,BILITOT:2,PROT:2,ALBUMIN:2 in the last 72 hours  No results found for this basename: LIPASE:2,AMYLASE:2 in the last 72 hours   Basename  10/16/11 1308   WBC  9.7   NEUTROABS  7.7   HGB  13.0   HCT  38.9   MCV  98.2   PLT  147*    No results found for this basename: CKTOTAL:3,CKMB:3,CKMBINDEX:3,TROPONINI:3 in the last 72 hours  No results found for this basename: TSH,T4TOTAL,FREET3,T3FREE,THYROIDAB in the last 72 hours  No results found for this basename: VITAMINB12:2,FOLATE:2,FERRITIN:2,TIBC:2,IRON:2,RETICCTPCT:2 in the last 72 hours  Radiological Exams on Admission:  Dg Chest 1 View  10/16/2011 *RADIOLOGY REPORT* Clinical Data: Preoperative respiratory evaluation prior to ORIF left femoral neck fracture. History of lymphoma. CHEST - 1 VIEW 10/16/2011: Comparison: Two-view chest x-ray 12/11/2006 [facility]. Multiple interval PET  CTs. Findings: Cardiac silhouette markedly enlarged, increased in size since the prior examination. Mild pulmonary venous hypertension without overt edema. Minimal linear atelectasis at the left lung base. Lungs otherwise clear. No visible pleural effusions. IMPRESSION: Marked cardiomegaly without pulmonary edema, with interval increase in heart size since January, 2008. Minimal atelectasis at the left lung base. No acute cardiopulmonary disease otherwise. Original Report Authenticated By: Arnell Sieving, M.D.  Dg Hip Complete Left  10/16/2011 *RADIOLOGY REPORT* Clinical Data: Fall, left hip pain LEFT HIP - COMPLETE 2+ VIEW Comparison: None. Findings: There is an acute fracture of the left femoral neck. No displaced pelvic fracture is identified. Normal visualized bowel gas pattern. Femoral heads are appropriately located on provided views. IMPRESSION: Acute left femoral neck fracture. Per CMS PQRS reporting requirements (PQRS Measure 24): Given the patient's age of greater than 50 and the fracture site (hip, distal radius, or spine), the patient should be tested for osteoporosis using DXA, and the appropriate treatment considered based on the DXA results. Original Report Authenticated By: Harrel Lemon, M.D.  Ct Head Wo Contrast  10/16/2011 *RADIOLOGY REPORT* Clinical Data: Status post fall. CT HEAD WITHOUT CONTRAST Technique: Contiguous axial images were obtained from the base of the skull through the vertex without contrast. Comparison: Brain MRI 11/11/2010. Findings: There is cortical atrophy with patchy confluent hypoattenuation in the subcortical and periventricular deep white matter. No evidence of acute abnormality including acute infarction, hemorrhage, mass lesion, mass effect, midline shift or abnormal extra-axial fluid collection. Extensive subcutaneous air is present. Small amount of air is seen in the left and cavernous sinuses, greater on the left. Tiny air fluid level left sphenoid sinus  noted. IMPRESSION: 1. No acute intracranial abnormality with atrophy and chronic microvascular ischemic change noted. 2. Extensive subcutaneous air. Question chest or facial injury. Critical Value/emergent results were  called by telephone at the time of interpretation on 10/16/2011 at 1:30 p.m. to Dr. Preston Fleeting, who verbally acknowledged these results. Original Report Authenticated By: Bernadene Bell. Maricela Curet, M.D.  Ct Soft Tissue Neck Wo Contrast  10/16/2011 *RADIOLOGY REPORT* Clinical Data: Head trauma. Subcutaneous air at the skull base. Evaluate source. CT NECK WITHOUT CONTRAST Technique: Multidetector CT imaging of the neck was performed without intravenous contrast. Comparison: CT head 10/16/2011. Findings: Severe cervical spondylosis with facet mediated slip C2-3 and C3-4. No visible cervical spine fracture. Advanced vascular calcification most notable involving the transverse aortic arch. No pneumothorax. The areas which were noted to previously contain air in the region of the skull base and infratemporal fossa no longer show this abnormality. The only air which remains in the neck is located intravascular, within the right and left internal jugular veins. Some extrapleural air is also noted in the chest, near the innominate-SVC confluence on the right. I believe the observed abnormality on CT head is due to introduction of air during IV placement. Airway midline and widely patent. Dental amalgam obscures much of the pharyngeal region but there are no definite mucosal lesions or significant adenopathy within limits of noncontrast technique. Normal epiglottis. Grossly clear sinuses and mastoids. No visible lesion of the major or minor salivary glands. IMPRESSION: Partial resolution of the previously identified neck emphysema. The only extrathoracic air which remains is intravascular, suggesting the previously identified air bubbles were introduced during IV placement. Advanced cervical spondylosis without visible  acute fracture or traumatic subluxation. Original Report Authenticated By: Elsie Stain, M.D.  Orders placed during the hospital encounter of 10/16/11   .  EKG 12-LEAD   .  EKG 12-LEAD    Assessment/Plan  Active Problems:  Problem # 1: L Hip fracture. Admit. Consult Dr Charlann Boxer. Clear for surgery when Operting room is open. Continue pain control. SW/CW - Will need SNF on D/c PT/OT post surgery.   Problem #2  MEMORY LOSS (ICD-780.93)/Dementia.  she is on full dose namenda/Aricept. Fall Risk. .  Problem # 3: HYPOTHYROIDISM (ICD-244.9)    On Levoxyl 75 Mcg Tabs (Levothyroxine sodium) .Marland Kitchen... Tale one tab, po, once a day  TSH fine on APE labs,.   Problem # 4: ATRIAL FIBRILLATION (ICD-427.31) INR only 1.54 and fine. OK from my standpoint for surgery when ortho can. Hold coumadin and let INR trend down.  Rate controlled and this is a chronic issue. Coumadin Tabs (Warfarin sodium tabs) .Marland Kitchen... Take 1/2 tablet daily except 1 tablet on mondays and fridays  Metoprolol Tartrate 50 Mg Tabs (Metoprolol tartrate) ..... One bid   Problem # 5: HYPERTENSION (ICD-401.9)  Her updated medication list for this problem includes:  Metoprolol Tartrate 50 Mg Tabs (Metoprolol tartrate) ..... One bid  Norvasc 5 Mg Tabs (Amlodipine besylate) .Marland Kitchen... Take 1 tablet once daily  Losartan Potassium 100 Mg Tabs (Losartan potassium) .Marland Kitchen... Take 1/2 tablet once daily  great control.   Problem # 6: DEPRESSION (ICD-311)  Her updated medication list for this problem includes:  Lexapro 10 Mg Tabs (Escitalopram oxalate) ..... One 1/2 po daily   Problem #6 Osteoporosis:  Hold Fosomax in hospital. Consider forteo as OutPt.  Problem # 8 - Finish Cipro Rx.  Problem # 9:  DVT prophylaxis  Jamera Vanloan M  10/16/2011, 5:10 PM   Current EKG is Afib.  Some lateral TWI. ? Ischemia but no current Sxs.  Will check 2 CKs and TI and follow up EKG in am. Doubt MI. Last EKG I could find was  2008 also showed LVH but T waves were  upright.

## 2011-10-16 NOTE — ED Notes (Signed)
Family at bedside. 

## 2011-10-16 NOTE — Progress Notes (Signed)
Reason for Consult:Left femoral neck fracture, left hip fracture Referring Physician: [name] is an 77 y.o. female.  HPI: 77yo female with ground level fall today at home.  No other injuries to report.  Seen at bedside with her sons.  No other complaints or concerns raised  Past Medical History  Diagnosis Date  . Urinary tract infection   . Cancer     non-hodgkins lymphoma   . Lymphoma in remission     ca free 5 yrs  . Dementia   . Atrial fibrillation     Past Surgical History  Procedure Date  . Eye surgery     bil.cataract with lens implants    History reviewed. No pertinent family history.  Social History:  reports that she has quit smoking. She has never used smokeless tobacco. She reports that she does not drink alcohol or use illicit drugs.  Allergies: No Known Allergies  Medications:  Scheduled:   . amLODipine  5 mg Oral Daily  . ciprofloxacin  250 mg Oral BID  . docusate sodium  100 mg Oral BID  . donepezil  23 mg Oral QHS  . escitalopram  5 mg Oral Daily  . heparin  5,000 Units Subcutaneous Q8H  . levothyroxine  75 mcg Oral Daily  . losartan  50 mg Oral Daily  . memantine  10 mg Oral BID  . metoprolol  50 mg Oral BID  . phytonadione  2.5 mg Oral Once  . sodium chloride  3 mL Intravenous Q12H  . Vitamin D (Ergocalciferol)  50,000 Units Oral Q7 days    Results for orders placed during the hospital encounter of 10/16/11 (from the past 24 hour(s))  URINALYSIS, ROUTINE W REFLEX MICROSCOPIC     Status: Abnormal   Collection Time   10/16/11 12:58 PM      Component Value Range   Color, Urine YELLOW  YELLOW    Appearance CLEAR  CLEAR    Specific Gravity, Urine 1.023  1.005 - 1.030    pH 5.5  5.0 - 8.0    Glucose, UA NEGATIVE  NEGATIVE (mg/dL)   Hgb urine dipstick NEGATIVE  NEGATIVE    Bilirubin Urine NEGATIVE  NEGATIVE    Ketones, ur NEGATIVE  NEGATIVE (mg/dL)   Protein, ur 30 (*) NEGATIVE (mg/dL)   Urobilinogen, UA 1.0  0.0 - 1.0 (mg/dL)     Nitrite NEGATIVE  NEGATIVE    Leukocytes, UA NEGATIVE  NEGATIVE   URINE MICROSCOPIC-ADD ON     Status: Abnormal   Collection Time   10/16/11 12:58 PM      Component Value Range   RBC / HPF 0-2  <3 (RBC/hpf)   Casts HYALINE CASTS (*) NEGATIVE    Urine-Other MUCOUS PRESENT    CBC     Status: Abnormal   Collection Time   10/16/11  1:08 PM      Component Value Range   WBC 9.7  4.0 - 10.5 (K/uL)   RBC 3.96  3.87 - 5.11 (MIL/uL)   Hemoglobin 13.0  12.0 - 15.0 (g/dL)   HCT 40.9  81.1 - 91.4 (%)   MCV 98.2  78.0 - 100.0 (fL)   MCH 32.8  26.0 - 34.0 (pg)   MCHC 33.4  30.0 - 36.0 (g/dL)   RDW 78.2  95.6 - 21.3 (%)   Platelets 147 (*) 150 - 400 (K/uL)  DIFFERENTIAL     Status: Abnormal   Collection Time   10/16/11  1:08 PM  Component Value Range   Neutrophils Relative 79 (*) 43 - 77 (%)   Neutro Abs 7.7  1.7 - 7.7 (K/uL)   Lymphocytes Relative 12  12 - 46 (%)   Lymphs Abs 1.1  0.7 - 4.0 (K/uL)   Monocytes Relative 8  3 - 12 (%)   Monocytes Absolute 0.8  0.1 - 1.0 (K/uL)   Eosinophils Relative 1  0 - 5 (%)   Eosinophils Absolute 0.1  0.0 - 0.7 (K/uL)   Basophils Relative 0  0 - 1 (%)   Basophils Absolute 0.0  0.0 - 0.1 (K/uL)  BASIC METABOLIC PANEL     Status: Abnormal   Collection Time   10/16/11  1:08 PM      Component Value Range   Sodium 139  135 - 145 (mEq/L)   Potassium 3.6  3.5 - 5.1 (mEq/L)   Chloride 101  96 - 112 (mEq/L)   CO2 30  19 - 32 (mEq/L)   Glucose, Bld 114 (*) 70 - 99 (mg/dL)   BUN 20  6 - 23 (mg/dL)   Creatinine, Ser 1.61  0.50 - 1.10 (mg/dL)   Calcium 8.7  8.4 - 09.6 (mg/dL)   GFR calc non Af Amer 57 (*) >90 (mL/min)   GFR calc Af Amer 66 (*) >90 (mL/min)  PROTIME-INR     Status: Abnormal   Collection Time   10/16/11  1:08 PM      Component Value Range   Prothrombin Time 19.0 (*) 11.6 - 15.2 (seconds)   INR 1.56 (*) 0.00 - 1.49   APTT     Status: Normal   Collection Time   10/16/11  1:08 PM      Component Value Range   aPTT 28  24 - 37  (seconds)  TYPE AND SCREEN     Status: Normal   Collection Time   10/16/11  1:08 PM      Component Value Range   ABO/RH(D) A POS     Antibody Screen NEG     Sample Expiration 10/19/2011    CARDIAC PANEL(CRET KIN+CKTOT+MB+TROPI)     Status: Abnormal   Collection Time   10/16/11  7:27 PM      Component Value Range   Total CK 135  7 - 177 (U/L)   CK, MB 4.4 (*) 0.3 - 4.0 (ng/mL)   Troponin I <0.30  <0.30 (ng/mL)   Relative Index 3.3 (*) 0.0 - 2.5      X-ray: Plain films of hip reveal displaced left femoral neck fracture   Blood pressure 130/88, pulse 73, temperature 98.1 F (36.7 C), temperature source Oral, resp. rate 20, SpO2 92.00%.  Awake alert, slight dementia,very pleasant and appears comfortable at present time Medical exam reviewed  LLE pain with movement thus deferred based on radiographs O/w NVI  Assessment/Plan: Left displaced femoral neck fracture  Surgery Sunday for Left hip hemiarthroplasty NPO after MN Saturday Currently off coumadin, on sub q heparin. Stop heparin Saturday night.  Reviewed plan with her sons,consent order placed  Tasha Ball D 10/16/2011, 11:22 PM

## 2011-10-16 NOTE — ED Notes (Signed)
Patient is resting comfortably.  Daughter has stepped out of room but has no requests and says pt is doing fine.  Awaiting the admitting MD to come see pt.

## 2011-10-16 NOTE — ED Notes (Signed)
MD at bedside. 

## 2011-10-16 NOTE — ED Notes (Signed)
Patient transported to CT 

## 2011-10-16 NOTE — ED Notes (Signed)
WUJ:[id number]<JY> Expected date:10/16/11<BR> Expected time:11:23 AM<BR> Means of arrival:Ambulance<BR> Comments:<BR> M41 - 87 yoF Fall and hip Pain.  No shortening

## 2011-10-17 ENCOUNTER — Encounter (HOSPITAL_COMMUNITY): Payer: Self-pay | Admitting: *Deleted

## 2011-10-17 LAB — BASIC METABOLIC PANEL
BUN: 17 mg/dL (ref 6–23)
Creatinine, Ser: 0.79 mg/dL (ref 0.50–1.10)
GFR calc Af Amer: 86 mL/min — ABNORMAL LOW (ref >90)
GFR calc non Af Amer: 74 mL/min — ABNORMAL LOW (ref >90)
Glucose, Bld: 130 mg/dL — ABNORMAL HIGH (ref 70–99)
Potassium: 3.6 mEq/L (ref 3.5–5.1)

## 2011-10-17 LAB — CBC
HCT: 38.6 % (ref 36.0–46.0)
Hemoglobin: 12.9 g/dL (ref 12.0–15.0)
MCHC: 33.4 g/dL (ref 30.0–36.0)
RBC: 3.96 MIL/uL (ref 3.87–5.11)
WBC: 12.7 10*3/uL — ABNORMAL HIGH (ref 4.0–10.5)

## 2011-10-17 LAB — URINE CULTURE
Colony Count: NO GROWTH
Culture  Setup Time: 201211161659
Culture: NO GROWTH

## 2011-10-17 LAB — CARDIAC PANEL(CRET KIN+CKTOT+MB+TROPI)
CK, MB: 3.5 ng/mL (ref 0.3–4.0)
Troponin I: <0.3 ng/mL (ref <0.30)

## 2011-10-17 LAB — PROTIME-INR
INR: 1.42 (ref 0.00–1.49)
Prothrombin Time: 17.6 seconds — ABNORMAL HIGH (ref 11.6–15.2)

## 2011-10-17 MED ORDER — LIP MEDEX EX OINT
TOPICAL_OINTMENT | CUTANEOUS | Status: AC
  Filled 2011-10-17: qty 7

## 2011-10-17 MED ORDER — SODIUM CHLORIDE 0.9 % IJ SOLN: 3.0000 mL | INTRAMUSCULAR | Status: DC | PRN

## 2011-10-17 MED ORDER — SODIUM CHLORIDE 0.9 % IV SOLN: 250.0000 mL | INTRAVENOUS | Status: DC

## 2011-10-17 MED ORDER — SODIUM CHLORIDE 0.9 % IV SOLN: INTRAVENOUS | Status: DC

## 2011-10-17 NOTE — Progress Notes (Signed)
Received PT orders.  Pt scheduled for surgery 10/18/11.  Will hold off on PT at this time.  Please clarify orders after surgery.  Thank you. Kingsley Callander, PT [phone]

## 2011-10-17 NOTE — Progress Notes (Signed)
Subjective: Admitted last night with L hip Fx. Awaiting surgery tomorrow. Coumadin on hold. On Hep SQ DVT Proph. Doing well.  No pain.  Had trouble sleeping last night.  Objective: Vital signs in last 24 hours: Temp:  [97 F (36.1 C)-98.9 F (37.2 C)] 98.9 F (37.2 C) (11/17 0550) Pulse Rate:  [64-81] 67  (11/17 0550) Resp:  [12-21] 16  (11/17 0550) BP: (103-130)/(66-90) 103/70 mmHg (11/17 0550) SpO2:  [86 %-99 %] 94 % (11/17 0550) Weight:  [50.803 kg (112 lb)-55 kg (121 lb 4.1 oz)] 121 lb 4.1 oz (55 kg) (11/17 0500) Weight change:  Last BM Date: 10/16/11  Intake/Output from previous day: 11/16 0701 - 11/17 0700 In: -  Out: 301 [Urine:300; Stool:1] Intake/Output this shift:   Physical Exam:   General appearance: Alert looks younger than age. Dementia. Oriented to place. Pleasant. OP Moist Neck: no adenopathy, no carotid bruit, no JVD and thyroid not enlarged, symmetric, no tenderness/mass/nodules  Resp: CTA B  Cardio: Irreg Irreg  GI: soft, non-tender; bowel sounds normal; no masses, no organomegaly  Extremities: L Hip rotated  Pulses: 1+ and symmetric     Lab Results:  Basename 10/17/11 0305 10/16/11 1308  NA 135 139  K 3.6 3.6  CL 98 101  CO2 30 30  GLUCOSE 130* 114*  BUN 17 20  CREATININE 0.79 0.90  CALCIUM 8.9 8.7  MG -- --  PHOS -- --   No results found for this basename: AST:2,ALT:2,ALKPHOS:2,BILITOT:2,PROT:2,ALBUMIN:2 in the last 72 hours  Basename 10/17/11 0305 10/16/11 1308  WBC 12.7* 9.7  NEUTROABS -- 7.7  HGB 12.9 13.0  HCT 38.6 38.9  MCV 97.5 98.2  PLT 147* 147*    Basename 10/17/11 0305 10/16/11 1927  CKTOTAL 121 135  CKMB 3.5 4.4*  CKMBINDEX -- --  TROPONINI <0.30 <0.30   No results found for this basename: TSH,T4TOTAL,FREET3,T3FREE,THYROIDAB in the last 72 hours No results found for this basename: VITAMINB12:2,FOLATE:2,FERRITIN:2,TIBC:2,IRON:2,RETICCTPCT:2 in the last 72 hours  Studies/Results: Dg Chest 1 View  10/16/2011   *RADIOLOGY REPORT*  Clinical Data: Preoperative respiratory evaluation prior to ORIF left femoral neck fracture.  History of lymphoma.  CHEST - 1 VIEW 10/16/2011:  Comparison: Two-view chest x-ray 12/11/2006 [facility]. Multiple interval PET CTs.  Findings: Cardiac silhouette markedly enlarged, increased in size since the prior examination.  Mild pulmonary venous hypertension without overt edema.  Minimal linear atelectasis at the left lung base.  Lungs otherwise clear.  No visible pleural effusions.  IMPRESSION: Marked cardiomegaly without pulmonary edema, with interval increase in heart size since January, 2008.  Minimal atelectasis at the left lung base.  No acute cardiopulmonary disease otherwise.  Original Report Authenticated By: Arnell Sieving, M.D.   Dg Hip Complete Left  10/16/2011  *RADIOLOGY REPORT*  Clinical Data: Fall, left hip pain  LEFT HIP - COMPLETE 2+ VIEW  Comparison: None.  Findings: There is an acute fracture of the left femoral neck.  No displaced pelvic fracture is identified.  Normal visualized bowel gas pattern.  Femoral heads are appropriately located on provided views.  IMPRESSION: Acute left femoral neck fracture. Per CMS PQRS reporting requirements (PQRS Measure 24): Given the patient's age of greater than 50 and the fracture site (hip, distal radius, or spine), the patient should be tested for osteoporosis using DXA, and the appropriate treatment considered based on the DXA results.  Original Report Authenticated By: Harrel Lemon, M.D.   Ct Head Wo Contrast  10/16/2011  *RADIOLOGY REPORT*  Clinical  Data: Status post fall.  CT HEAD WITHOUT CONTRAST  Technique:  Contiguous axial images were obtained from the base of the skull through the vertex without contrast.  Comparison: Brain MRI 11/11/2010.  Findings: There is cortical atrophy with patchy confluent hypoattenuation in the subcortical and periventricular deep white matter.  No evidence of acute abnormality  including acute infarction, hemorrhage, mass lesion, mass effect, midline shift or abnormal extra-axial fluid collection.  Extensive subcutaneous air is present.  Small amount of air is seen in the left and cavernous sinuses, greater on the left. Tiny air fluid level left sphenoid sinus noted.  IMPRESSION:  1.  No acute intracranial abnormality with atrophy and chronic microvascular ischemic change noted. 2.  Extensive subcutaneous air.  Question chest or facial injury. Critical Value/emergent results were called by telephone at the time of interpretation on 10/16/2011  at 1:30 p.m.  to  Dr. Preston Fleeting, who verbally acknowledged these results.  Original Report Authenticated By: Bernadene Bell. Maricela Curet, M.D.   Ct Soft Tissue Neck Wo Contrast  10/16/2011  *RADIOLOGY REPORT*  Clinical Data: Head trauma.  Subcutaneous air at the skull base. Evaluate source.  CT NECK WITHOUT CONTRAST  Technique:  Multidetector CT imaging of the neck was performed without intravenous contrast.  Comparison: CT head 10/16/2011.  Findings: Severe cervical spondylosis with facet mediated slip C2-3 and C3-4. No visible cervical spine fracture.  Advanced vascular calcification most notable involving the  transverse aortic arch. No pneumothorax.   The areas which were noted to previously contain air in the region of the skull base and infratemporal fossa no longer show this abnormality.  The only air which remains in the neck is located intravascular, within the right and left internal jugular veins. Some extrapleural air is also noted in the chest, near the innominate-SVC confluence on the right.  I believe the observed abnormality on CT head is due to introduction of air during IV placement.  Airway midline and widely patent.   Dental amalgam obscures much of the pharyngeal region but there are no definite mucosal lesions or significant adenopathy within limits of noncontrast technique. Normal epiglottis.  Grossly clear sinuses and mastoids. No  visible lesion of the major or minor salivary glands.  IMPRESSION: Partial resolution of the previously identified neck emphysema. The only extrathoracic air which remains is intravascular, suggesting the previously identified air bubbles were introduced during IV placement.  Advanced cervical spondylosis without visible acute fracture or traumatic subluxation.  Original Report Authenticated By: Elsie Stain, M.D.     Medications: Scheduled:   . amLODipine  5 mg Oral Daily  . ciprofloxacin  250 mg Oral BID  . docusate sodium  100 mg Oral BID  . donepezil  23 mg Oral QHS  . escitalopram  5 mg Oral Daily  . heparin  5,000 Units Subcutaneous Q8H  . levothyroxine  75 mcg Oral Daily  . losartan  50 mg Oral Daily  . memantine  10 mg Oral BID  . metoprolol  50 mg Oral BID  . phytonadione  2.5 mg Oral Once  . sodium chloride  3 mL Intravenous Q12H  . Vitamin D (Ergocalciferol)  50,000 Units Oral Q7 days   Continuous:   Assessment/Plan: Principal Problem:  *Hip fracture, left  Problem # 1: L Hip fracture.  Left displaced femoral neck fracture  Surgery Sunday for Left hip hemiarthroplasty  NPO after MN Saturday  Currently off coumadin, on sub q heparin.  Stop heparin Saturday night. Continue pain control.  SW/CW - Will need SNF on D/c  PT/OT post surgery.   Problem #2 MEMORY LOSS (ICD-780.93)/Dementia.  she is on full dose namenda/Aricept.  Fall Risk.    Problem # 3: HYPOTHYROIDISM (ICD-244.9)  On Levoxyl 75 Mcg Tabs (Levothyroxine sodium) .Marland Kitchen... Tale one tab, po, once a day  TSH fine on APE labs,.   Problem # 4: ATRIAL FIBRILLATION (ICD-427.31)  INR only 1.42 and fine.  OK from my standpoint for surgery when ortho can.  Hold coumadin and let INR trend down further.  Rate controlled and this is a chronic issue.  Continue on Metoprolol Tartrate 50 Mg Tabs (Metoprolol tartrate) ..... One bid   Problem # 5: HYPERTENSION (ICD-401.9)  Her updated medication list for this problem  includes:  Metoprolol Tartrate 50 Mg Tabs (Metoprolol tartrate) ..... One bid  Norvasc 5 Mg Tabs (Amlodipine besylate) .Marland Kitchen... Take 1 tablet once daily  Losartan Potassium 100 Mg Tabs (Losartan potassium) .Marland Kitchen... Take 1/2 tablet once daily  great control.   Problem # 6: DEPRESSION (ICD-311)  Her updated medication list for this problem includes:  Lexapro 10 Mg Tabs (Escitalopram oxalate) ..... One 1/2 po daily   Problem #7 Osteoporosis: Hold Fosomax in hospital.  Consider forteo as OutPt.   Problem # 8 - UTI - Finish Cipro Rx.   Problem # 9: DVT prophylaxis Hep SQ ordered.  Problem #10 - Slight change in EKG - Ck and TI (-). Last night repeat EKG was stable.   LOS: 1 day   Johntae Broxterman M 10/17/2011, 8:23 AM

## 2011-10-17 NOTE — Progress Notes (Signed)
She is comfortable and ready for surgery--hgb 10.9

## 2011-10-18 ENCOUNTER — Inpatient Hospital Stay (HOSPITAL_COMMUNITY): Payer: Medicare Other

## 2011-10-18 ENCOUNTER — Encounter (HOSPITAL_COMMUNITY)
Admission: EM | Disposition: A | Discharge status: Rehabilitation Facility | Payer: Self-pay | Source: Home / Self Care | Attending: Internal Medicine

## 2011-10-18 ENCOUNTER — Other Ambulatory Visit: Payer: Self-pay

## 2011-10-18 ENCOUNTER — Encounter (HOSPITAL_COMMUNITY): Payer: Self-pay | Admitting: Anesthesiology

## 2011-10-18 ENCOUNTER — Inpatient Hospital Stay (HOSPITAL_COMMUNITY): Payer: Medicare Other | Admitting: Anesthesiology

## 2011-10-18 DIAGNOSIS — I4891 Unspecified atrial fibrillation: Secondary | ICD-10-CM

## 2011-10-18 HISTORY — PX: HIP ARTHROPLASTY: SHX981

## 2011-10-18 LAB — BASIC METABOLIC PANEL
BUN: 18 mg/dL (ref 6–23)
Chloride: 97 mEq/L (ref 96–112)
GFR calc Af Amer: 71 mL/min — ABNORMAL LOW (ref >90)
GFR calc non Af Amer: 62 mL/min — ABNORMAL LOW (ref >90)
Glucose, Bld: 111 mg/dL — ABNORMAL HIGH (ref 70–99)
Potassium: 3.7 mEq/L (ref 3.5–5.1)
Sodium: 134 mEq/L — ABNORMAL LOW (ref 135–145)

## 2011-10-18 LAB — CBC
HCT: 37.7 % (ref 36.0–46.0)
MCV: 97.4 fL (ref 78.0–100.0)
Platelets: 123 10*3/uL — ABNORMAL LOW (ref 150–400)
RBC: 3.87 MIL/uL (ref 3.87–5.11)
RDW: 13.8 % (ref 11.5–15.5)
WBC: 9.7 10*3/uL (ref 4.0–10.5)

## 2011-10-18 LAB — PROTIME-INR: INR: 1.36 (ref 0.00–1.49)

## 2011-10-18 SURGERY — HEMIARTHROPLASTY, HIP, DIRECT ANTERIOR APPROACH, FOR FRACTURE
Anesthesia: General | Site: Hip | Laterality: Left

## 2011-10-18 MED ORDER — TRAMADOL-ACETAMINOPHEN 37.5-325 MG PO TABS
1.0000 | ORAL_TABLET | Freq: Four times a day (QID) | ORAL | Status: DC | PRN
  Administered 2011-10-22 – 2011-10-23 (×2): 1 via ORAL
  Filled 2011-10-18 (×2): qty 2

## 2011-10-18 MED ORDER — GLYCOPYRROLATE 0.2 MG/ML IJ SOLN
INTRAMUSCULAR | Status: DC | PRN
  Administered 2011-10-18: .4 mg via INTRAVENOUS

## 2011-10-18 MED ORDER — FERROUS SULFATE 325 (65 FE) MG PO TABS
325.0000 mg | ORAL_TABLET | Freq: Three times a day (TID) | ORAL | Status: DC
  Administered 2011-10-18 – 2011-10-26 (×25): 325 mg via ORAL
  Filled 2011-10-18 (×26): qty 1

## 2011-10-18 MED ORDER — SODIUM CHLORIDE 0.9 % IV SOLN
INTRAVENOUS | Status: DC
  Administered 2011-10-18 – 2011-10-19 (×2): via INTRAVENOUS
  Filled 2011-10-18 (×5): qty 1000

## 2011-10-18 MED ORDER — MAGNESIUM HYDROXIDE 400 MG/5ML PO SUSP: 30.0000 mL | Freq: Two times a day (BID) | ORAL | Status: DC | PRN

## 2011-10-18 MED ORDER — METOCLOPRAMIDE HCL 10 MG PO TABS: 5.0000 mg | ORAL_TABLET | Freq: Three times a day (TID) | ORAL | Status: DC | PRN

## 2011-10-18 MED ORDER — PHENYLEPHRINE HCL 10 MG/ML IJ SOLN
INTRAMUSCULAR | Status: DC | PRN
  Administered 2011-10-18 (×2): 20 ug via INTRAVENOUS

## 2011-10-18 MED ORDER — FLEET ENEMA 7-19 GM/118ML RE ENEM: 1.0000 | ENEMA | Freq: Every day | RECTAL | Status: DC | PRN

## 2011-10-18 MED ORDER — SODIUM CHLORIDE 0.9 % IR SOLN
Status: DC | PRN
  Administered 2011-10-18: 1000 mL

## 2011-10-18 MED ORDER — SUCCINYLCHOLINE CHLORIDE 20 MG/ML IJ SOLN
INTRAMUSCULAR | Status: DC | PRN
  Administered 2011-10-18: 80 mg via INTRAVENOUS

## 2011-10-18 MED ORDER — DILTIAZEM HCL 30 MG PO TABS
30.0000 mg | ORAL_TABLET | Freq: Four times a day (QID) | ORAL | Status: DC
  Administered 2011-10-18: 30 mg via ORAL
  Filled 2011-10-18 (×9): qty 1

## 2011-10-18 MED ORDER — PHENOL 1.4 % MT LIQD: 1.0000 | OROMUCOSAL | Status: DC | PRN

## 2011-10-18 MED ORDER — ACETAMINOPHEN 650 MG RE SUPP: 650.0000 mg | Freq: Four times a day (QID) | RECTAL | Status: DC | PRN

## 2011-10-18 MED ORDER — FENTANYL CITRATE 0.05 MG/ML IJ SOLN
INTRAMUSCULAR | Status: DC | PRN
  Administered 2011-10-18: 50 ug via INTRAVENOUS
  Administered 2011-10-18 (×2): 25 ug via INTRAVENOUS
  Administered 2011-10-18: 50 ug via INTRAVENOUS

## 2011-10-18 MED ORDER — HEPARIN SODIUM (PORCINE) 5000 UNIT/ML IJ SOLN
5000.0000 [IU] | Freq: Three times a day (TID) | INTRAMUSCULAR | Status: DC
  Administered 2011-10-18 – 2011-10-21 (×8): 5000 [IU] via SUBCUTANEOUS
  Filled 2011-10-18 (×11): qty 1

## 2011-10-18 MED ORDER — LACTATED RINGERS IV SOLN: INTRAVENOUS | Status: DC

## 2011-10-18 MED ORDER — NEOSTIGMINE METHYLSULFATE 1 MG/ML IJ SOLN
INTRAMUSCULAR | Status: DC | PRN
  Administered 2011-10-18: 3 mg via INTRAVENOUS

## 2011-10-18 MED ORDER — METOPROLOL TARTRATE 25 MG PO TABS
25.0000 mg | ORAL_TABLET | Freq: Three times a day (TID) | ORAL | Status: DC
  Administered 2011-10-18 – 2011-10-26 (×23): 25 mg via ORAL
  Filled 2011-10-18 (×26): qty 1

## 2011-10-18 MED ORDER — METHOCARBAMOL 500 MG PO TABS
500.0000 mg | ORAL_TABLET | Freq: Four times a day (QID) | ORAL | Status: DC | PRN
  Administered 2011-10-21 – 2011-10-24 (×6): 500 mg via ORAL
  Filled 2011-10-18 (×6): qty 1

## 2011-10-18 MED ORDER — DILTIAZEM HCL 50 MG/10ML IV SOLN
INTRAVENOUS | Status: DC | PRN
  Administered 2011-10-18: 15 mg via INTRAVENOUS

## 2011-10-18 MED ORDER — DOCUSATE SODIUM 100 MG PO CAPS
100.0000 mg | ORAL_CAPSULE | Freq: Two times a day (BID) | ORAL | Status: DC
  Administered 2011-10-18 – 2011-10-26 (×14): 100 mg via ORAL
  Filled 2011-10-18 (×19): qty 1

## 2011-10-18 MED ORDER — POLYETHYLENE GLYCOL 3350 17 G PO PACK
17.0000 g | PACK | Freq: Every day | ORAL | Status: DC | PRN
  Filled 2011-10-18 (×2): qty 1

## 2011-10-18 MED ORDER — ACETAMINOPHEN 10 MG/ML IV SOLN
INTRAVENOUS | Status: DC | PRN
  Administered 2011-10-18: 1000 mg via INTRAVENOUS

## 2011-10-18 MED ORDER — FENTANYL CITRATE 0.05 MG/ML IJ SOLN: 25.0000 ug | INTRAMUSCULAR | Status: DC | PRN

## 2011-10-18 MED ORDER — ONDANSETRON HCL 4 MG PO TABS: 4.0000 mg | ORAL_TABLET | Freq: Four times a day (QID) | ORAL | Status: DC | PRN

## 2011-10-18 MED ORDER — CEFAZOLIN SODIUM 1-5 GM-% IV SOLN
1.0000 g | Freq: Four times a day (QID) | INTRAVENOUS | Status: AC
  Administered 2011-10-18 – 2011-10-19 (×3): 1 g via INTRAVENOUS
  Filled 2011-10-18 (×6): qty 50

## 2011-10-18 MED ORDER — LIDOCAINE HCL (CARDIAC) 20 MG/ML IV SOLN
INTRAVENOUS | Status: DC | PRN
  Administered 2011-10-18: 20 mg via INTRAVENOUS

## 2011-10-18 MED ORDER — PROPOFOL 10 MG/ML IV EMUL
INTRAVENOUS | Status: DC | PRN
  Administered 2011-10-18: 100 mg via INTRAVENOUS

## 2011-10-18 MED ORDER — MENTHOL 3 MG MT LOZG: 1.0000 | LOZENGE | OROMUCOSAL | Status: DC | PRN

## 2011-10-18 MED ORDER — SODIUM CHLORIDE 0.9 % IV SOLN
30.0000 ug/min | INTRAVENOUS | Status: DC
  Filled 2011-10-18: qty 1

## 2011-10-18 MED ORDER — ACETAMINOPHEN 325 MG PO TABS
650.0000 mg | ORAL_TABLET | Freq: Four times a day (QID) | ORAL | Status: DC | PRN
  Administered 2011-10-20: 650 mg via ORAL
  Filled 2011-10-18: qty 2

## 2011-10-18 MED ORDER — ONDANSETRON HCL 4 MG/2ML IJ SOLN
INTRAMUSCULAR | Status: DC | PRN
  Administered 2011-10-18: 2 mg via INTRAVENOUS

## 2011-10-18 MED ORDER — LACTATED RINGERS IV SOLN
INTRAVENOUS | Status: DC | PRN
  Administered 2011-10-18: 1000 mL
  Administered 2011-10-18: 10:00:00 via INTRAVENOUS

## 2011-10-18 MED ORDER — BISACODYL 10 MG RE SUPP: 10.0000 mg | Freq: Every day | RECTAL | Status: DC | PRN

## 2011-10-18 MED ORDER — CEFAZOLIN SODIUM 1-5 GM-% IV SOLN
INTRAVENOUS | Status: DC | PRN
  Administered 2011-10-18: 1 g via INTRAVENOUS

## 2011-10-18 MED ORDER — CISATRACURIUM BESYLATE 2 MG/ML IV SOLN
INTRAVENOUS | Status: DC | PRN
  Administered 2011-10-18: 4 mg via INTRAVENOUS

## 2011-10-18 MED ORDER — WARFARIN SODIUM 5 MG PO TABS
5.0000 mg | ORAL_TABLET | Freq: Once | ORAL | Status: AC
  Administered 2011-10-18: 5 mg via ORAL
  Filled 2011-10-18: qty 1

## 2011-10-18 MED ORDER — BISACODYL 5 MG PO TBEC: 10.0000 mg | DELAYED_RELEASE_TABLET | Freq: Every day | ORAL | Status: DC | PRN

## 2011-10-18 MED ORDER — PROMETHAZINE HCL 25 MG/ML IJ SOLN: 6.2500 mg | INTRAMUSCULAR | Status: DC | PRN

## 2011-10-18 MED ORDER — ONDANSETRON HCL 4 MG/2ML IJ SOLN: 4.0000 mg | Freq: Four times a day (QID) | INTRAMUSCULAR | Status: DC | PRN

## 2011-10-18 MED ORDER — SODIUM CHLORIDE 0.9 % IV BOLUS (SEPSIS)
250.0000 mL | Freq: Once | INTRAVENOUS | Status: AC
  Administered 2011-10-18: 250 mL via INTRAVENOUS

## 2011-10-18 MED ORDER — ESMOLOL HCL 10 MG/ML IV SOLN
INTRAVENOUS | Status: DC | PRN
  Administered 2011-10-18: 20 mg via INTRAVENOUS

## 2011-10-18 MED ORDER — METOCLOPRAMIDE HCL 5 MG/ML IJ SOLN: 5.0000 mg | Freq: Three times a day (TID) | INTRAMUSCULAR | Status: DC | PRN

## 2011-10-18 MED ORDER — METHOCARBAMOL 100 MG/ML IJ SOLN
500.0000 mg | Freq: Four times a day (QID) | INTRAMUSCULAR | Status: DC | PRN
  Filled 2011-10-18: qty 5

## 2011-10-18 SURGICAL SUPPLY — 46 items
BAG ZIPLOCK 12X15 (MISCELLANEOUS) ×2 IMPLANT
BLADE SAW SGTL 18X1.27X75 (BLADE) ×2 IMPLANT
CLOTH BEACON ORANGE TIMEOUT ST (SAFETY) ×2 IMPLANT
DERMABOND ADVANCED (GAUZE/BANDAGES/DRESSINGS) ×2
DERMABOND ADVANCED .7 DNX12 (GAUZE/BANDAGES/DRESSINGS) ×2 IMPLANT
DRAPE INCISE IOBAN 85X60 (DRAPES) ×2 IMPLANT
DRAPE ORTHO SPLIT 77X108 STRL (DRAPES) ×2
DRAPE POUCH INSTRU U-SHP 10X18 (DRAPES) ×2 IMPLANT
DRAPE SURG 17X11 SM STRL (DRAPES) ×2 IMPLANT
DRAPE SURG ORHT 6 SPLT 77X108 (DRAPES) ×2 IMPLANT
DRAPE U-SHAPE 47X51 STRL (DRAPES) ×2 IMPLANT
DRSG AQUACEL AG ADV 3.5X10 (GAUZE/BANDAGES/DRESSINGS) ×6 IMPLANT
DRSG MEPILEX BORDER 4X4 (GAUZE/BANDAGES/DRESSINGS) IMPLANT
DRSG MEPILEX BORDER 4X8 (GAUZE/BANDAGES/DRESSINGS) IMPLANT
DURAPREP 26ML APPLICATOR (WOUND CARE) ×2 IMPLANT
ELECT BLADE TIP CTD 4 INCH (ELECTRODE) ×2 IMPLANT
ELECT REM PT RETURN 9FT ADLT (ELECTROSURGICAL) ×2
ELECTRODE REM PT RTRN 9FT ADLT (ELECTROSURGICAL) ×1 IMPLANT
EVACUATOR 1/8 PVC DRAIN (DRAIN) ×2 IMPLANT
FACESHIELD LNG OPTICON STERILE (SAFETY) ×8 IMPLANT
GLOVE BIOGEL PI IND STRL 7.5 (GLOVE) ×1 IMPLANT
GLOVE BIOGEL PI IND STRL 8 (GLOVE) ×1 IMPLANT
GLOVE BIOGEL PI IND STRL 8.5 (GLOVE) IMPLANT
GLOVE BIOGEL PI INDICATOR 7.5 (GLOVE) ×1
GLOVE BIOGEL PI INDICATOR 8 (GLOVE) ×1
GLOVE BIOGEL PI INDICATOR 8.5 (GLOVE)
GLOVE ECLIPSE 8.0 STRL XLNG CF (GLOVE) IMPLANT
GLOVE ORTHO TXT STRL SZ7.5 (GLOVE) ×4 IMPLANT
GLOVE SURG ORTHO 8.0 STRL STRW (GLOVE) ×2 IMPLANT
GOWN STRL NON-REIN LRG LVL3 (GOWN DISPOSABLE) ×2 IMPLANT
HANDPIECE INTERPULSE COAX TIP (DISPOSABLE)
IMMOBILIZER KNEE 20 (SOFTGOODS)
IMMOBILIZER KNEE 20 THIGH 36 (SOFTGOODS) IMPLANT
KIT BASIN OR (CUSTOM PROCEDURE TRAY) ×2 IMPLANT
MANIFOLD NEPTUNE II (INSTRUMENTS) ×2 IMPLANT
PACK TOTAL JOINT (CUSTOM PROCEDURE TRAY) ×2 IMPLANT
POSITIONER SURGICAL ARM (MISCELLANEOUS) ×2 IMPLANT
SET HNDPC FAN SPRY TIP SCT (DISPOSABLE) IMPLANT
STRIP CLOSURE SKIN 1/2X4 (GAUZE/BANDAGES/DRESSINGS) IMPLANT
SUT ETHIBOND NAB CT1 #1 30IN (SUTURE) ×2 IMPLANT
SUT MNCRL AB 4-0 PS2 18 (SUTURE) ×2 IMPLANT
SUT VIC AB 1 CT1 36 (SUTURE) ×4 IMPLANT
SUT VIC AB 2-0 CT1 27 (SUTURE) ×2
SUT VIC AB 2-0 CT1 TAPERPNT 27 (SUTURE) ×2 IMPLANT
TOWEL OR 17X26 10 PK STRL BLUE (TOWEL DISPOSABLE) ×4 IMPLANT
TRAY FOLEY CATH 14FRSI W/METER (CATHETERS) IMPLANT

## 2011-10-18 NOTE — Anesthesia Preprocedure Evaluation (Addendum)
Anesthesia Evaluation   Patient confused  General Assessment Comment:Patients family indicates that last B blocker given approx. 48 hours ago. EKG displays atrial fibrillation with controled ventricular response preop. Ellect to proceed with surgical intervention and administer B blocker as needed. Review of chart indicates B blocker given in hospital at 2200 on 10/17/11.  Reviewed: Allergy & Precautions, H&P , NPO status , Patient's Chart, lab work & pertinent test results, reviewed documented beta blocker date and time   History of Anesthesia Complications Negative for: history of anesthetic complications  Airway Mallampati: II TM Distance: >3 FB     Dental  (+) Teeth Intact and Dental Advisory Given   Pulmonary neg pulmonary ROS,  clear to auscultation  Pulmonary exam normal       Cardiovascular Exercise Tolerance: Poor hypertension, Pt. on medications and Pt. on home beta blockers Irregular Normal History of Atrial Fibrillation under good control   Neuro/Psych PSYCHIATRIC DISORDERS History of Dementia, mild-moderate. Negative Neurological ROS     GI/Hepatic negative GI ROS, Neg liver ROS,   Endo/Other  Negative Endocrine ROS  Renal/GU negative Renal ROS  Genitourinary negative   Musculoskeletal  (+) Arthritis -,   Abdominal   Peds negative pediatric ROS (+)  Hematology History of lymphoma, S/P chemotherapy   Anesthesia Other Findings   Reproductive/Obstetrics negative OB ROS                        Anesthesia Physical Anesthesia Plan  ASA: III and Emergent  Anesthesia Plan: General   Post-op Pain Management:    Induction: Intravenous  Airway Management Planned:   Additional Equipment:   Intra-op Plan:   Post-operative Plan: Extubation in OR  Informed Consent: I have reviewed the patients History and Physical, chart, labs and discussed the procedure including the risks, benefits  and alternatives for the proposed anesthesia with the patient or authorized representative who has indicated his/her understanding and acceptance.   Dental advisory given  Plan Discussed with: CRNA  Anesthesia Plan Comments:         Anesthesia Quick Evaluation

## 2011-10-18 NOTE — Progress Notes (Signed)
Subjective: Just had her hemiarthroplasty and did well except Afib and RVR HR 160 - did not respond to esmolol and then Given 15 mg IV cardizem and HR now 90's.  Dr Charlann Boxer called me and said that he consulted [facility].  Will also be going to Step down or Tele. I am seeing her in PACU and she is extubated.  She is awake answering ?s and no distress.  No c/o.  Anestheiology was concerned @ some wider complex morphologies.  Objective: Vital signs in last 24 hours: Temp:  [97.7 F (36.5 C)-99 F (37.2 C)] 99 F (37.2 C) (11/18 0844) Pulse Rate:  [64-69] 69  (11/18 0844) Resp:  [14-16] 14  (11/18 0844) BP: (100-146)/(63-93) 146/86 mmHg (11/18 0844) SpO2:  [96 %-99 %] 98 % (11/18 0844) Weight change:  Last BM Date: 10/16/11  Intake/Output from previous day: 11/17 0701 - 11/18 0700 In: 450 [P.O.:450] Out: 600 [Urine:600] Intake/Output this shift: Total I/O In: 900 [I.V.:900] Out: 100 [Urine:100] Physical Exam:   General appearance: Alert looks younger than age. Dementia. Oriented to place. Pleasant. OP Moist Neck: no adenopathy, no carotid bruit, no JVD and thyroid not enlarged, symmetric, no tenderness/mass/nodules  Resp: CTA B  Cardio: Irreg Irreg HR 90-117 now. GI: soft, non-tender; bowel sounds normal; no masses, no organomegaly  Extremities: L Hip S/P Hemiarthroplasty Pulses: 1+ and symmetric     Lab Results:  [facility] 10/18/11 0510 10/17/11 0305  NA 134* 135  K 3.7 3.6  CL 97 98  CO2 30 30  GLUCOSE 111* 130*  BUN 18 17  CREATININE 0.84 0.79  CALCIUM 9.0 8.9  MG -- --  PHOS -- --   No results found for this basename: AST:2,ALT:2,ALKPHOS:2,BILITOT:2,PROT:2,ALBUMIN:2 in the last 72 hours  Basename 10/18/11 0510 10/17/11 0305 10/16/11 1308  WBC 9.7 12.7* --  NEUTROABS -- -- 7.7  HGB 12.5 12.9 --  HCT 37.7 38.6 --  MCV 97.4 97.5 --  PLT 123* 147* --    Basename 10/17/11 0305 10/16/11 1927  CKTOTAL 121 135  CKMB 3.5 4.4*  CKMBINDEX -- --  TROPONINI <0.30  <0.30   Lab Results  Component Value Date   INR 1.36 10/18/2011   INR 1.42 10/17/2011   INR 1.56* 10/16/2011   PROTIME 24.0* 01/21/2007    Studies/Results: Dg Chest 1 View  10/16/2011  *RADIOLOGY REPORT*  Clinical Data: Preoperative respiratory evaluation prior to ORIF left femoral neck fracture.  History of lymphoma.  CHEST - 1 VIEW 10/16/2011:  Comparison: Two-view chest x-ray 12/11/2006 [facility]. Multiple interval PET CTs.  Findings: Cardiac silhouette markedly enlarged, increased in size since the prior examination.  Mild pulmonary venous hypertension without overt edema.  Minimal linear atelectasis at the left lung base.  Lungs otherwise clear.  No visible pleural effusions.  IMPRESSION: Marked cardiomegaly without pulmonary edema, with interval increase in heart size since January, 2008.  Minimal atelectasis at the left lung base.  No acute cardiopulmonary disease otherwise.  Original Report Authenticated By: Arnell Sieving, M.D.   Dg Hip Complete Left  10/16/2011  *RADIOLOGY REPORT*  Clinical Data: Fall, left hip pain  LEFT HIP - COMPLETE 2+ VIEW  Comparison: None.  Findings: There is an acute fracture of the left femoral neck.  No displaced pelvic fracture is identified.  Normal visualized bowel gas pattern.  Femoral heads are appropriately located on provided views.  IMPRESSION: Acute left femoral neck fracture. Per CMS PQRS reporting requirements (PQRS Measure 24): Given the patient's age  of greater than 50 and the fracture site (hip, distal radius, or spine), the patient should be tested for osteoporosis using DXA, and the appropriate treatment considered based on the DXA results.  Original Report Authenticated By: Harrel Lemon, M.D.   Ct Head Wo Contrast  10/16/2011  *RADIOLOGY REPORT*  Clinical Data: Status post fall.  CT HEAD WITHOUT CONTRAST  Technique:  Contiguous axial images were obtained from the base of the skull through the vertex without contrast.   Comparison: Brain MRI 11/11/2010.  Findings: There is cortical atrophy with patchy confluent hypoattenuation in the subcortical and periventricular deep white matter.  No evidence of acute abnormality including acute infarction, hemorrhage, mass lesion, mass effect, midline shift or abnormal extra-axial fluid collection.  Extensive subcutaneous air is present.  Small amount of air is seen in the left and cavernous sinuses, greater on the left. Tiny air fluid level left sphenoid sinus noted.  IMPRESSION:  1.  No acute intracranial abnormality with atrophy and chronic microvascular ischemic change noted. 2.  Extensive subcutaneous air.  Question chest or facial injury. Critical Value/emergent results were called by telephone at the time of interpretation on 10/16/2011  at 1:30 p.m.  to  Dr. Preston Fleeting, who verbally acknowledged these results.  Original Report Authenticated By: Bernadene Bell. Maricela Curet, M.D.   Ct Soft Tissue Neck Wo Contrast  10/16/2011  *RADIOLOGY REPORT*  Clinical Data: Head trauma.  Subcutaneous air at the skull base. Evaluate source.  CT NECK WITHOUT CONTRAST  Technique:  Multidetector CT imaging of the neck was performed without intravenous contrast.  Comparison: CT head 10/16/2011.  Findings: Severe cervical spondylosis with facet mediated slip C2-3 and C3-4. No visible cervical spine fracture.  Advanced vascular calcification most notable involving the  transverse aortic arch. No pneumothorax.   The areas which were noted to previously contain air in the region of the skull base and infratemporal fossa no longer show this abnormality.  The only air which remains in the neck is located intravascular, within the right and left internal jugular veins. Some extrapleural air is also noted in the chest, near the innominate-SVC confluence on the right.  I believe the observed abnormality on CT head is due to introduction of air during IV placement.  Airway midline and widely patent.   Dental amalgam obscures  much of the pharyngeal region but there are no definite mucosal lesions or significant adenopathy within limits of noncontrast technique. Normal epiglottis.  Grossly clear sinuses and mastoids. No visible lesion of the major or minor salivary glands.  IMPRESSION: Partial resolution of the previously identified neck emphysema. The only extrathoracic air which remains is intravascular, suggesting the previously identified air bubbles were introduced during IV placement.  Advanced cervical spondylosis without visible acute fracture or traumatic subluxation.  Original Report Authenticated By: Elsie Stain, M.D.     Medications: Scheduled:    . amLODipine  5 mg Oral Daily  . ciprofloxacin  250 mg Oral BID  . docusate sodium  100 mg Oral BID  . donepezil  23 mg Oral QHS  . escitalopram  5 mg Oral Daily  . levothyroxine  75 mcg Oral Daily  . losartan  50 mg Oral Daily  . memantine  10 mg Oral BID  . metoprolol  50 mg Oral BID  . sodium chloride  3 mL Intravenous Q12H  . Vitamin D (Ergocalciferol)  50,000 Units Oral Q7 days  . DISCONTD: heparin  5,000 Units Subcutaneous Q8H   Continuous:    .  sodium chloride    . sodium chloride    . lactated ringers      Assessment/Plan: Principal Problem:  *Hip fracture, left  Problem # 1: L Hip fracture.  Left displaced femoral neck fracture S/p Left hip hemiarthroplasty  Currently off coumadin, on sub q heparin and will need to restart both post surgery  Continue pain control.  SW/CW - Will need SNF on D/c  PT/OT post surgery.   Problem #2 MEMORY LOSS (ICD-780.93)/Dementia.  she is on full dose namenda/Aricept.  Fall Risk.    Problem # 3: HYPOTHYROIDISM (ICD-244.9)  On Levoxyl 75 Mcg Tabs (Levothyroxine sodium) .Marland Kitchen... Tale one tab, po, once a day  TSH fine on APE labs,.   Problem # 4: ATRIAL FIBRILLATION (ICD-427.31)  INR only 1.36 and fine.  Restart coumadin when able. Cards was consulted. S/P Cardizem bolus for RVR. Start cardizem  standing Continue on Metoprolol Tartrate 50 Mg Tabs (Metoprolol tartrate) ..... One bid   Problem # 5: HYPERTENSION (ICD-401.9)  BP fine.  Problem #6  Osteoporosis: Hold Fosomax in hospital.  Consider forteo as OutPt.   Problem # 7 - UTI - Finish Cipro Rx.   Problem # 8 DVT prophylaxis Hep SQ -  Whenable.  Problem #9- Slight change in EKG on admit - Ck and TI (-). Last night repeat EKG was stable.   LOS: 2 days   Yarisbel Miranda M 10/18/2011, 12:02 PM

## 2011-10-18 NOTE — Progress Notes (Signed)
ANTICOAGULATION CONSULT NOTE - Initial Consult  Pharmacy Consult for Coumadin Indication: VTE prophylaxis  No Known Allergies  Patient Measurements: Height: 5\' 6"  (167.6 cm) Weight: 121 lb 4.1 oz (55 kg) IBW/kg (Calculated) : 59.3   Vital Signs: Temp: 97.5 F (36.4 C) (11/18 1321) Temp src: Oral (11/18 0844) BP: 112/74 mmHg (11/18 1255) Pulse Rate: 78  (11/18 1300)  Labs:  Basename 10/18/11 0510 10/17/11 0305 10/16/11 1927 10/16/11 1308  HGB 12.5 12.9 -- --  HCT 37.7 38.6 -- 38.9  PLT 123* 147* -- 147*  APTT -- -- -- 28  LABPROT 17.0* 17.6* -- 19.0*  INR 1.36 1.42 -- 1.56*  HEPARINUNFRC -- -- -- --  CREATININE 0.84 0.79 -- 0.90  CKTOTAL -- 121 135 --  CKMB -- 3.5 4.4* --  TROPONINI -- <0.30 <0.30 --   Estimated Creatinine Clearance: 42.5 ml/min (by C-G formula based on Cr of 0.84).  Medical History: Past Medical History  Diagnosis Date  . Urinary tract infection   . Cancer     non-hodgkins lymphoma   . Lymphoma in remission     ca free 5 yrs  . Dementia   . Atrial fibrillation    Medications:  Prescriptions prior to admission  Medication Sig Dispense Refill  . alendronate (FOSAMAX) 70 MG tablet Take 70 mg by mouth every 7 (seven) days. Take with a full glass of water on an empty stomach.       Marland Kitchen amLODipine (NORVASC) 5 MG tablet Take 5 mg by mouth daily.        . ciprofloxacin (CIPRO) 250 MG tablet Take 250 mg by mouth 2 (two) times daily.        Marland Kitchen donepezil (ARICEPT) 23 MG TABS tablet Take 23 mg by mouth at bedtime.        Marland Kitchen escitalopram (LEXAPRO) 10 MG tablet Take 5 mg by mouth daily.        Marland Kitchen levothyroxine (SYNTHROID, LEVOTHROID) 75 MCG tablet Take 75 mcg by mouth daily.        Marland Kitchen losartan (COZAAR) 100 MG tablet Take 50 mg by mouth daily.        . memantine (NAMENDA) 10 MG tablet Take 10 mg by mouth 2 (two) times daily.        . metoprolol (LOPRESSOR) 50 MG tablet Take 50 mg by mouth 2 (two) times daily.        . Vitamin D, Ergocalciferol, (DRISDOL) 50000  UNITS CAPS Take 50,000 Units by mouth every 7 (seven) days.        Marland Kitchen warfarin (COUMADIN) 5 MG tablet Take 2.5-5 mg by mouth daily. 0.5 tab daily except 1 tab on Mondays and Fridays         Assessment: Resuming Coumadin s/p Left hip hemiarthroplasty. Takes Coumadin chronically for A.fib. On Cipro for UTI which can increase sensitivity to Coumadin.  Bridging with heparin SQ until INR therapeutic.  Goal of Therapy:  INR 2-3   Plan:  Coumadin 5mg  tonight. Check PT/INR daily.  Tasha Ball 10/18/2011,1:42 PM

## 2011-10-18 NOTE — Consult Note (Signed)
Patient Name: Tasha Ball  MRN: [id number]  HPI: ROMONIA YANIK is an 77 y.o. femalereferred for consultation by Dr.John Bo Mcclintock, MD for atrial fibrillation.  This very nice elderly woman suffers from mild dementia. She lives at home with her son and daughter-in-law and does generally well, typically moving about without difficulty.  On the day of admission, she was walking outside and was witnessed to lose her footing on uneven ground and fall, suffering a left hip fracture. She denies dyspnea or chest discomfort. She is able to walk indefinitely on flat ground without symptoms. She denies orthopnea, PND or pedal edema. She notes no palpitations. Heart rate was elevated postoperatively, but responded to intravenous diltiazem.  Atrial fibrillation dates it least to 2008.  No information is available regarding etiology.  Past Medical History  Diagnosis Date  . Urinary tract infection   . Cancer     non-hodgkins lymphoma   . Lymphoma in remission     ca free 5 yrs  . Dementia   . Atrial fibrillation     Past Surgical History  Procedure Date  . Eye surgery     bil.cataract with lens implants    History reviewed. No pertinent family history.  Social History:  reports that she has quit smoking. She has never used smokeless tobacco. She reports that she does not drink alcohol or use illicit drugs.  Allergies: No Known Allergies  Medications:  I have reviewed the patient's current medications. Prior to Admission:  Prescriptions prior to admission  Medication Sig Dispense Refill  . alendronate (FOSAMAX) 70 MG tablet Take 70 mg by mouth every 7 (seven) days. Take with a full glass of water on an empty stomach.       Marland Kitchen amLODipine (NORVASC) 5 MG tablet Take 5 mg by mouth daily.        . ciprofloxacin (CIPRO) 250 MG tablet Take 250 mg by mouth 2 (two) times daily.        Marland Kitchen donepezil (ARICEPT) 23 MG TABS tablet Take 23 mg by mouth at bedtime.        Marland Kitchen escitalopram (LEXAPRO) 10 MG tablet Take  5 mg by mouth daily.        Marland Kitchen levothyroxine (SYNTHROID, LEVOTHROID) 75 MCG tablet Take 75 mcg by mouth daily.        Marland Kitchen losartan (COZAAR) 100 MG tablet Take 50 mg by mouth daily.        . memantine (NAMENDA) 10 MG tablet Take 10 mg by mouth 2 (two) times daily.        . metoprolol (LOPRESSOR) 50 MG tablet Take 50 mg by mouth 2 (two) times daily.        . Vitamin D, Ergocalciferol, (DRISDOL) 50000 UNITS CAPS Take 50,000 Units by mouth every 7 (seven) days.        Marland Kitchen warfarin (COUMADIN) 5 MG tablet Take 2.5-5 mg by mouth daily. 0.5 tab daily except 1 tab on Mondays and Fridays         PROTIME-INR     Status: Abnormal   Collection Time   10/17/11  3:05 AM      Component Value Range Comment   Prothrombin Time 17.6 (*) 11.6 - 15.2 (seconds)    INR 1.42  0.00 - 1.49    BASIC METABOLIC PANEL     Status: Abnormal   Collection Time   10/18/11  5:10 AM      Component Value Range Comment   Sodium 134 (*)  135 - 145 (mEq/L)    Potassium 3.7  3.5 - 5.1 (mEq/L)    Chloride 97  96 - 112 (mEq/L)    CO2 30  19 - 32 (mEq/L)    Glucose, Bld 111 (*) 70 - 99 (mg/dL)    BUN 18  6 - 23 (mg/dL)    Creatinine, Ser 1.61  0.50 - 1.10 (mg/dL)    Calcium 9.0  8.4 - 10.5 (mg/dL)    GFR calc non Af Amer 62 (*) >90 (mL/min)    GFR calc Af Amer 71 (*) >90 (mL/min)           CBC     Status: Abnormal   Collection Time   10/18/11  5:10 AM      Component Value Range Comment   WBC 9.7  4.0 - 10.5 (K/uL)    RBC 3.87  3.87 - 5.11 (MIL/uL)    Hemoglobin 12.5  12.0 - 15.0 (g/dL)    HCT 09.6  04.5 - 40.9 (%)    MCV 97.4  78.0 - 100.0 (fL)    MCH 32.3  26.0 - 34.0 (pg)    MCHC 33.2  30.0 - 36.0 (g/dL)    RDW 81.1  91.4 - 78.2 (%)    Platelets 123 (*) 150 - 400 (K/uL)    Review of Systems: General: no anorexia, weight gain or weight loss Cardiac: no chest pain, dyspnea, orthopnea, PND,  or syncope Respiratory: no cough, sputum production or hemoptysis GI: no nausea, abdominal pain, emesis, diarrhea or  constipation Integument: no significant lesions Neurologic: No muscle weakness or paralysis; no speech disturbance; no headache  Physical Exam: Blood pressure 80/51, pulse 58, temperature 97.6 F (36.4 C), temperature source Oral, resp. rate 16, height 5\' 6"  (1.676 m), weight 55 kg (121 lb 4.1 oz), SpO2 97.00%.  General-Well-developed; no acute distress; alert and oriented x2.5-knows what year it is HEENT-[place]/AT; PERRL; EOM intact; conjunctiva and lids nl Neck-Distended external jugular, more prominent on the right; no carotid bruits Endocrine-No thyromegaly Lungs-Clear lung fields; resonant percussion; normal I-to-E ratio Cardiovascular- normal PMI; normal S1 and S2; grade 1/6 holosystolic murmur at the lower left sternal border; modest systolic ejection murmur at the cardiac base Abdomen-BS normal; soft and non-tender without masses or organomegaly Musculoskeletal-No deformities, cyanosis or clubbing Neurologic-Nl cranial nerves; symmetric strength and tone Skin- Warm, no significant lesions Extremities-Nl distal pulses; no edema  EKG: (10/16/11) AF, controlled ventricular response, rate=83, QT prolongation, left axis, ST-T wave abnormality suggestive of LVH or anterolateral ischemia.  Assessment/Plan:   Atrial fibrillation: Arrhythmia has been present for a number of years without significant adverse effects or symptoms. Anticoagulation has been maintained without significant adverse effects. Warfarin should be restarted both as postoperative prophylaxis for DVT and to prevent thromboembolic events related to atrial fibrillation.  Echocardiogram will be performed to evaluate a structural cardiac cause for atrial fibrillation and possible significant tricuspid regurgitation accounting for jugular venous distention. TSH has also been requested.  Blood pressure sagging postoperatively. Amlodipine will be discontinued. Oral diltiazem has already been stopped. Dose of metoprolol has been  decreased to 25 mg 3 times per day with hold order for heart rate less than 65 for blood pressure less than 90 systolic.  If blood pressure remains low, but heart rate is not controlled, it may be necessary to substitute digoxin or amiodarone for beta blocker. She has a minimal anemia, likely related to bleeding from her fracture. She has osteoporosis, and postoperative assessment should include bone densitometry,  if not performed recently, and reconsideration of the adequacy of her therapy for bone demineralization.   Bing, MD 10/18/2011, 4:15 PM

## 2011-10-18 NOTE — Transfer of Care (Signed)
Immediate Anesthesia Transfer of Care Note  Patient: Tasha Ball  Procedure(s) Performed:  ARTHROPLASTY BIPOLAR HIP - depuy  Patient Location: PACU  Anesthesia Type: General  Level of Consciousness: awake  Airway & Oxygen Therapy: Patient Spontanous Breathing and Patient connected to face mask  Post-op Assessment: Report given to PACU RN  Post vital signs: Reviewed and unstable  Complications: cardiovascular complications

## 2011-10-18 NOTE — Anesthesia Postprocedure Evaluation (Signed)
  Anesthesia Post-op Note  Patient: Tasha Ball  Procedure(s) Performed:  ARTHROPLASTY BIPOLAR HIP - depuy  Patient Location: PACU  Anesthesia Type: General  Level of Consciousness: awake and confused  Airway and Oxygen Therapy: Patient Spontanous Breathing  Post-op Pain: mild  Post-op Assessment: Post-op Vital signs reviewed  Post-op Vital Signs: stable  Complications: cardiovascular complications

## 2011-10-18 NOTE — Op Note (Signed)
NAMENAKAYLA, RORABAUGH                 ACCOUNT NO.:  [id number]  MEDICAL RECORD NO.:  [id number]  LOCATION:  1611                         FACILITY:  [facility]  PHYSICIAN:  Madlyn Frankel. Charlann Boxer, M.D.  DATE OF BIRTH:  11/30/1924  DATE OF PROCEDURE:  10/18/2011 DATE OF DISCHARGE:                              OPERATIVE REPORT   PREOPERATIVE DIAGNOSIS:  Displaced left femoral neck fracture.  POSTOPERATIVE DIAGNOSIS:  Displaced left femoral neck fracture.  PROCEDURE:  Left hip hemiarthroplasty utilizing DePuy component.  A size 4 high offset Tri-Lock stem with a 46 unipolar ball, +8 Zero adapter.  SURGEON:  Madlyn Frankel. Charlann Boxer, M.D.  ASSISTANT:  Lanney Gins, PA.  ANESTHESIA:  General.  SPECIMENS:  None.  COMPLICATIONS:  None.  DRAINS:  None.  CONDITION:  Stable to the recovery room.  INDICATION FOR PROCEDURE:  Ms. Donaldson is an 77 year old female with mild pleasant dementia, who had a ground level fall at home.  She currently lives at home with family.  She had immediate inability to bear weight and pain, was brought to the emergency room where radiographs revealed a displaced femoral neck fracture.  She was admitted to the medical service.  Risks, benefits, and necessity of the procedure were discussed and reviewed with her sons acting as power of attorneys.  Consent was obtained for benefit of pain relief and fracture management. Postoperative course was reviewed.  Standard risks of infection, DVT, component failure, dislocation were discussed in the setting of hip fracture management.  PROCEDURE IN DETAIL:  The patient was brought to the operative theater. Once adequate anesthesia, preoperative antibiotics, Ancef were administered, she was positioned into the right lateral decubitus position with the left side up.  The left hip was prescrubbed based on her presentation with Betadine.  Once adequately positioned and comfortably padded, her left hip was then prepped and draped in  a sterile fashion.  A time-out was performed identifying the patient, planned procedure, and extremity.  A lateral incision was then made based off the proximal trochanter.  Sharp dissection was carried to the iliotibial band and gluteal fascia, which were then incised for a posterior approach.  After defining the gluteus medius border, the short external rotators and posterior capsule were taken down together preserving the posterior labrum.  The fracture site was identified, it was noted to be 8.  The fractured femoral head was removed and measured on the back table using the rings as a 46 mm ball.  The femoral neck cut was revisited with an oscillating saw to create an osteotomy into the trochanteric fossa with femoral retraction.  At this point, I identified an anterior labral tear for which I used a 15 blade to excise this labrum.  Once this was done, we attended to the femoral preparation.  Once the retractors were placed for the femur, the proximal femur was opened with a drill, hand reamed once and then irrigated to try to prevent fat emboli.  I then began to broach with a 0 broach setting the anteversion 10 degrees more than her native at about 20 degrees, 25 degrees.  I broached up to a size 4 broach.  I broached up by  single digit broaching to a size 4.  I used a calcar planer to mill away any remaining bone and did a trial reduction with a standard neck, initially 46 ball with 0 adapter.  Trial reduction revealed a stable hip.  The leg lengths appeared to be comparable to the down leg as measured in the preoperative positioning compared to the right leg.  I did feel that I would, based on her dementia, try a higher offset neck; however, based on some enhanced drilling to prevent dislocation.  At this point, the trial components were dislocated and removed.  The final size 4 high Tri-Lock stem was chosen.  After irrigating the femoral canal and the acetabulum  making certain there was no remaining debris  the final 4 high Tri-Lock stem was impacted and sat at the level where the broach was.  Based on this and the trial reduction, I chose a 0 adapter.  The 0 adapter was impacted into the 46 unipolar ball and the 2 impacted onto a clean and dry trunnion.  The hip had been irrigated throughout the case and again at this point. At this point, we reapproximated the posterior capsular and short external rotator tissue back to the superior leaflet using a #1 Vicryl. I chose not to use a Hemovac drain in this case.  The remainder of the wound at this point was closed in layers with a #1 Vicryl on the iliotibial band and gluteal fascia, 2-0 Vicryl on the subcu layer and a running 4-0 Monocryl.  The hip was cleaned, dried, and dressed sterilely using Dermabond and Aquacel dressing.  The patient was brought to the recovery room in stable condition tolerating the procedure well.     Madlyn Frankel Charlann Boxer, M.D.     MDO/MEDQ  D:  10/18/2011  T:  10/18/2011  Job:  [id number]

## 2011-10-18 NOTE — Brief Op Note (Signed)
10/16/2011 - 10/18/2011  11:05 AM  PATIENT:  Tasha Ball  77 y.o. female  PRE-OPERATIVE DIAGNOSIS:  fracture left hip  POST-OPERATIVE DIAGNOSIS:  fracture left hip  PROCEDURE:  Procedure(s): ARTHROPLASTY BIPOLAR HIP, Left hip  SURGEON:  Surgeon(s): [name]  PHYSICIAN ASSISTANT: Lanney Gins, PA-C  ANESTHESIA:   general  EBL:  Total I/O In: -  Out: 100 [Urine:100]  200cc  BLOOD ADMINISTERED:none  DRAINS: none   LOCAL MEDICATIONS USED:  None  SPECIMEN:  No Specimen  DISPOSITION OF SPECIMEN:  N/A  COUNTS:  YES  TOURNIQUET:  * No tourniquets in log *  DICTATION: .Other Dictation: Dictation Number [id number]  PLAN OF CARE: Admit to inpatient   PATIENT DISPOSITION:  PACU - hemodynamically stable.   Delay start of Pharmacological VTE agent (>24hrs) due to surgical blood loss or risk of bleeding:  {YES/NO/NOT APPLICABLE:20182

## 2011-10-18 NOTE — Progress Notes (Signed)
  HD 2, with left femoral neck fracture  Subjective: Pleasant dementia, no events or problems   Objective: Vital signs in last 24 hours: Temp:  [97.7 F (36.5 C)-99 F (37.2 C)] 99 F (37.2 C) (11/18 0844) Pulse Rate:  [64-69] 69  (11/18 0844) Resp:  [14-16] 14  (11/18 0844) BP: (100-146)/(63-93) 146/86 mmHg (11/18 0844) SpO2:  [96 %-99 %] 98 % (11/18 0844)   Basename 10/18/11 0510 10/17/11 0305 10/16/11 1308  HGB 12.5 12.9 13.0    Basename 10/18/11 0510 10/17/11 0305  WBC 9.7 12.7*  RBC 3.87 3.96  HCT 37.7 38.6  PLT 123* 147*    Basename 10/18/11 0510 10/17/11 0305  NA 134* 135  K 3.7 3.6  CL 97 98  CO2 30 30  BUN 18 17  CREATININE 0.84 0.79  GLUCOSE 111* 130*  CALCIUM 9.0 8.9    Basename 10/18/11 0510 10/17/11 0305  LABPT -- --  INR 1.36 1.42    PE:  shortened LLE with painful movement, o/w NVI  Assessment/Plan:  Plan is to go to OR today for Left hip hemiarthroplasty Reviewed plan with her sons   [name] 10/18/2011, 9:22 AM

## 2011-10-19 DIAGNOSIS — W19XXXA Unspecified fall, initial encounter: Secondary | ICD-10-CM

## 2011-10-19 DIAGNOSIS — S72009A Fracture of unspecified part of neck of unspecified femur, initial encounter for closed fracture: Secondary | ICD-10-CM

## 2011-10-19 DIAGNOSIS — F0390 Unspecified dementia without behavioral disturbance: Secondary | ICD-10-CM

## 2011-10-19 LAB — CBC
HCT: 38.3 % (ref 36.0–46.0)
Hemoglobin: 12.6 g/dL (ref 12.0–15.0)
MCH: 32.5 pg (ref 26.0–34.0)
MCV: 98.7 fL (ref 78.0–100.0)
RBC: 3.88 MIL/uL (ref 3.87–5.11)

## 2011-10-19 LAB — BASIC METABOLIC PANEL
BUN: 13 mg/dL (ref 6–23)
CO2: 27 mEq/L (ref 19–32)
Calcium: 8.4 mg/dL (ref 8.4–10.5)
Glucose, Bld: 117 mg/dL — ABNORMAL HIGH (ref 70–99)
Sodium: 136 mEq/L (ref 135–145)

## 2011-10-19 LAB — MRSA PCR SCREENING: MRSA by PCR: POSITIVE — AB

## 2011-10-19 LAB — PROTIME-INR: Prothrombin Time: 16.6 seconds — ABNORMAL HIGH (ref 11.6–15.2)

## 2011-10-19 MED ORDER — SODIUM CHLORIDE 0.9 % IV SOLN
INTRAVENOUS | Status: DC
Start: 2011-10-19 — End: 2011-10-20
  Administered 2011-10-19: 13:00:00 via INTRAVENOUS

## 2011-10-19 MED ORDER — MUPIROCIN 2 % EX OINT
1.0000 "application " | TOPICAL_OINTMENT | Freq: Two times a day (BID) | CUTANEOUS | Status: AC
  Administered 2011-10-20 – 2011-10-24 (×9): 1 via NASAL
  Filled 2011-10-19: qty 22

## 2011-10-19 MED ORDER — CHLORHEXIDINE GLUCONATE CLOTH 2 % EX PADS
6.0000 | MEDICATED_PAD | Freq: Every day | CUTANEOUS | Status: AC
  Administered 2011-10-20 – 2011-10-24 (×4): 6 via TOPICAL

## 2011-10-19 MED ORDER — FLORA-Q PO CAPS
1.0000 | ORAL_CAPSULE | Freq: Every day | ORAL | Status: DC
  Administered 2011-10-19 – 2011-10-26 (×8): 1 via ORAL
  Filled 2011-10-19 (×8): qty 1

## 2011-10-19 MED ORDER — WARFARIN SODIUM 5 MG PO TABS
5.0000 mg | ORAL_TABLET | Freq: Once | ORAL | Status: AC
  Administered 2011-10-19: 5 mg via ORAL
  Filled 2011-10-19: qty 1

## 2011-10-19 NOTE — Progress Notes (Signed)
Occupational Therapy Evaluation Patient Details Name: Tasha Ball MRN: [id number] DOB: Jul 28, 1924 Today's Date: 10/19/2011 1047 - 1113 EV Problem List:  Patient Active Problem List  Diagnoses  . Atrial fibrillation  . Urinary tract infection  . Dementia  . Hip fracture  . Hip fracture, left    Past Medical History:  Past Medical History  Diagnosis Date  . Urinary tract infection   . Cancer     non-hodgkins lymphoma   . Lymphoma in remission     ca free 5 yrs  . Dementia   . Atrial fibrillation    Past Surgical History:  Past Surgical History  Procedure Date  . Eye surgery     bil.cataract with lens implants    OT Assessment/Plan/Recommendation OT Assessment Clinical Impression Statement: pt would benefit from skilled OT secondary to the deficits listed below.  Mod A goals for acute OT Recommendation/Assessment: Patient will need skilled OT in the acute care venue OT Problem List: Decreased strength;Decreased activity tolerance;Impaired balance (sitting and/or standing);Decreased cognition;Decreased knowledge of use of DME or AE;Decreased knowledge of precautions;Cardiopulmonary status limiting activity;Pain OT Therapy Diagnosis : Generalized weakness OT Plan OT Frequency: Min 1X/week OT Treatment/Interventions: Self-care/ADL training;DME and/or AE instruction;Therapeutic activities;Cognitive remediation/compensation;Patient/family education;Balance training OT Recommendation Follow Up Recommendations: Inpatient Rehab Equipment Recommended: Defer to next venue Individuals Consulted Consulted and Agree with Results and Recommendations: Patient OT Goals Acute Rehab OT Goals OT Goal Formulation: With patient Time For Goal Achievement: 7 days ADL Goals Pt Will Perform Grooming: with min assist;Standing at sink ADL Goal: Grooming - Progress: Progressing toward goals Pt Will Perform Lower Body Bathing: with min assist;Sit to stand from chair;with adaptive  equipment;with cueing (comment type and amount);Other (comment) (min cues) ADL Goal: Lower Body Bathing - Progress: Progressing toward goals Pt Will Perform Upper Body Dressing: with supervision;Sitting, chair;Supported;Other (comment) (min cues) ADL Goal: Upper Body Dressing - Progress: Progressing toward goals Pt Will Perform Lower Body Dressing: with mod assist;Sit to stand from chair;with adaptive equipment;with cueing (comment type and amount) (min vcs) ADL Goal: Lower Body Dressing - Progress: Progressing toward goals Pt Will Transfer to Toilet: with min assist;3-in-1;Stand pivot transfer;with cueing (comment type and amount) (min vcs) ADL Goal: Toilet Transfer - Progress: Progressing toward goals Pt Will Perform Toileting - Hygiene: with min assist;Standing at 3-in-1/toilet;with cueing (comment type and amount) (min vcs) ADL Goal: Toileting - Hygiene - Progress: Progressing toward goals Miscellaneous OT Goals Miscellaneous OT Goal #1: pt will name 3 thps using sign in room as cue OT Goal: Miscellaneous Goal #1 - Progress: Progressing toward goals  OT Evaluation Precautions/Restrictions  Precautions Precautions: Posterior Hip Precaution Comments: initiated verbal instruction/education Restrictions Weight Bearing Restrictions: Yes LLE Weight Bearing: Weight bearing as tolerated Prior Functioning Home Living Lives With: Family;Son (and daughter-in-law) Receives Help From: Family Type of Home: House Home Layout: One level Home Access: Stairs to enter Entrance Stairs-Rails: Right Entrance Stairs-Number of Steps: 2-3 Bathroom Shower/Tub: Health visitor: Standard Home Adaptive Equipment: Shower chair with back Prior Function Level of Independence: Independent with basic ADLs;Independent with gait;Independent with transfers;Needs assistance with homemaking Driving: No ADL ADL Grooming: Simulated;Set up Where Assessed - Grooming: Sitting, chair;Supported Engineer, water Bathing: Simulated;Supervision/safety Where Assessed - Upper Body Bathing: Sitting, bed;Supported Lower Body Bathing: Simulated;+2 Total assistance;Comment for patient % (20% bathing and 40% sit to stand for all ADLs) Lower Body Bathing Details (indicate cue type and reason): mod cues for thps Where Assessed - Lower Body Bathing: Sit to stand from  chair Upper Body Dressing: Performed;Minimal assistance;Other (comment) (mod a for balance sitting eob; pt leans posteriorly) Where Assessed - Upper Body Dressing: Supported;Sitting, bed Lower Body Dressing: Simulated;+2 Total assistance;Comment for patient % (0) Where Assessed - Lower Body Dressing: Sit to stand from chair Toilet Transfer: Simulated;+2 Total assistance;Comment for patient % (40) Toilet Transfer Details (indicate cue type and reason): min Toilet Transfer Method: Stand pivot (took about 3 steps) Acupuncturist: Other (comment) (recliner) Toileting - Clothing Manipulation: Simulated;+2 Total assistance;Comment for patient % (25) Toileting - Clothing Manipulation Details (indicate cue type and reason): min cues Where Assessed - Toileting Clothing Manipulation: Sit to stand from 3-in-1 or toilet Toileting - Hygiene: Simulated;+2 Total assistance;Comment for patient % (0) Toileting - Hygiene Details (indicate cue type and reason): min Where Assessed - Toileting Hygiene: Standing Equipment Used: Rolling walker ADL Comments: cotx with PT.  Pt needs reinforcement with THPs.  Oriented to all but [facility]--said [facility]   discussed THPs and AE but did not use Vision/Perception  Vision - History Baseline Vision: No visual deficits Patient Visual Report: No change from baseline Cognition Cognition Arousal/Alertness: Awake/alert Overall Cognitive Status: History of cognitive impairments History of Cognitive Impairment: Appears at baseline functioning Orientation Level: Other (Comment) (oriented x 4--except thought [facility] instead of  [facility]) Cognition - Other Comments: had some difficulty with memory for PLOF/home questions Sensation/Coordination   Extremity Assessment RUE Assessment RUE Assessment: Within Functional Limits LUE Assessment LUE Assessment: Within Functional Limits (strength grossly 4-/5 bilaterally) Mobility  Bed Mobility Bed Mobility: Yes Supine to Sit: 3: Mod assist Supine to Sit Details (indicate cue type and reason): with cues patient able to scoot over and move legs to edge of bed (assist for left leg,)  needed assistance for elevating trunk Sitting - Scoot to Edge of Bed: 3: Mod assist Sitting - Scoot to Edge of Bed Details (indicate cue type and reason): scooting patient with pad underneath Transfers Sit to Stand: 1: +2 Total assist;Patient percentage (comment) (40) Sit to Stand Details (indicate cue type and reason): pt= 40%, assist for forward weight shift, lifting off bed Stand to Sit: To chair/3-in-1;With upper extremity assist;1: +2 Total assist Stand to Sit Details: pt= 40% once hands placed back on armrests, facilitation through right hip for flexion and pt able to sit. Exercises   End of Session OT - End of Session Equipment Utilized During Treatment: Gait belt Activity Tolerance: Patient limited by pain Patient left: in chair;with call bell in reach General Behavior During Session: [facility] for tasks performed Cognition: Impaired, at baseline   Ellory Khurana [phone]  10/19/2011, 12:35 PM

## 2011-10-19 NOTE — Progress Notes (Signed)
  Subjective: No complaints this a.m.Marland Kitchen  She knows she is in the hospital due to a fall but did not  Know which hip she fractured. No pain now.  Heartrate controlled well now.  Objective: Vital signs in last 24 hours: Temp:  [97.4 F (36.3 C)-99 F (37.2 C)] 97.9 F (36.6 C) (11/19 0400) Pulse Rate:  [56-101] 73  (11/19 0600) Resp:  [9-18] 14  (11/19 0600) BP: (80-149)/(51-98) 116/67 mmHg (11/19 0600) SpO2:  [95 %-100 %] 98 % (11/19 0600) Weight change:  Last BM Date: 10/16/11  Intake/Output from previous day: 11/18 0701 - 11/19 0700 In: 3022.5 [P.O.:660; I.V.:2012.5; IV Piggyback:350] Out: 580 [Urine:580] Intake/Output this shift:    General appearance: alert and cooperative Resp: clear to auscultation bilaterally Cardio: irregularly irregular rhythm GI: soft, non-tender; bowel sounds normal; no masses,  no organomegaly Extremities: extremities normal, atraumatic, no cyanosis or edema Neurologic: Grossly normal   Lab Results:  Basename 10/19/11 0315 10/18/11 0510  WBC 11.6* 9.7  HGB 12.6 12.5  HCT 38.3 37.7  PLT 130* 123*   BMET  Basename 10/19/11 0315 10/18/11 0510  NA 136 134*  K 3.7 3.7  CL 99 97  CO2 27 30  GLUCOSE 117* 111*  BUN 13 18  CREATININE 0.75 0.84  CALCIUM 8.4 9.0   CMET CMP     Component Value Date/Time   NA 136 10/19/2011 0315   K 3.7 10/19/2011 0315   CL 99 10/19/2011 0315   CO2 27 10/19/2011 0315   GLUCOSE 117* 10/19/2011 0315   BUN 13 10/19/2011 0315   CREATININE 0.75 10/19/2011 0315   CALCIUM 8.4 10/19/2011 0315   PROT 6.5 11/03/2010 1132   ALBUMIN 4.2 11/03/2010 1132   AST 35 11/03/2010 1132   ALT 24 11/03/2010 1132   ALKPHOS 45 11/03/2010 1132   BILITOT 1.4* 11/03/2010 1132   GFRNONAA 75* 10/19/2011 0315   GFRAA 87* 10/19/2011 0315     Studies/Results: Dg Pelvis Portable  10/18/2011  *RADIOLOGY REPORT*  Clinical Data: Postoperative for left hemiarthroplasty.  PORTABLE PELVIS  Comparison: 10/16/2011  Findings: A bipolar left  hip hemiarthroplasty is in place, without new fracture or evidence of complicating feature.  Expected gas noted in the soft tissues.  IMPRESSION:  1.  Left hip bipolar hemiarthroplasty is in place, without complicating feature observed.  Original Report Authenticated By: Dellia Cloud, M.D.    Medications: I have reviewed the patient's current medications.  Assessment/Plan:  Principal Problem:  *Hip fracture, left-  Post op day one.  Doing okay.  May move to tele bed. Patient Active Problem List  Diagnoses  . Atrial fibrillation  Per cardiology.  On coumadin, metoprolol.  dvt proph dose heparin   . Urinary tract infection  Complete 5 days cipro. Add flora q   . Dementia  Follow on treatment  . Hip fracture see above  . Hip fracture, left see above Ask for CIR consult given  The fact that she has a very supportive son Nedra Hai and daughter-in-law who live with so she may be able to return home in  A few weeks.      LOS: 3 days   Ezequiel Kayser, MD 10/19/2011, 7:28 AM

## 2011-10-19 NOTE — Progress Notes (Signed)
ANTICOAGULATION CONSULT NOTE - Follow Up Consult  Pharmacy Consult for Coumadin Indication: Afib, VTE prophylaxis s/p bipolar hip arthroplasty  No Known Allergies  Patient Measurements: Height: 5\' 6"  (167.6 cm) Weight: 121 lb 4.1 oz (55 kg) IBW/kg (Calculated) : 59.3  Adjusted Body Weight:   Vital Signs: Temp: 97.9 F (36.6 C) (11/19 0400) Temp src: Oral (11/19 0400) BP: 116/67 mmHg (11/19 0600) Pulse Rate: 73  (11/19 0600)  Labs:  Basename 10/19/11 0315 10/18/11 0510 10/17/11 0305 10/16/11 1927 10/16/11 1308  HGB 12.6 12.5 -- -- --  HCT 38.3 37.7 38.6 -- --  PLT 130* 123* 147* -- --  APTT -- -- -- -- 28  LABPROT 16.6* 17.0* 17.6* -- --  INR 1.32 1.36 1.42 -- --  HEPARINUNFRC -- -- -- -- --  CREATININE 0.75 0.84 0.79 -- --  CKTOTAL -- -- 121 135 --  CKMB -- -- 3.5 4.4* --  TROPONINI -- -- <0.30 <0.30 --   Estimated Creatinine Clearance: 44.6 ml/min (by C-G formula based on Cr of 0.75).   Medications:  Scheduled:    . ceFAZolin (ANCEF) IV  1 g Intravenous Q6H  . ciprofloxacin  250 mg Oral BID  . docusate sodium  100 mg Oral BID  . docusate sodium  100 mg Oral BID  . donepezil  23 mg Oral QHS  . escitalopram  5 mg Oral Daily  . ferrous sulfate  325 mg Oral TID PC  . heparin subcutaneous  5,000 Units Subcutaneous Q8H  . levothyroxine  75 mcg Oral Daily  . losartan  50 mg Oral Daily  . memantine  10 mg Oral BID  . metoprolol  25 mg Oral TID  . sodium chloride  250 mL Intravenous Once  . Vitamin D (Ergocalciferol)  50,000 Units Oral Q7 days  . warfarin  5 mg Oral ONCE-1800  . DISCONTD: amLODipine  5 mg Oral Daily  . DISCONTD: diltiazem  30 mg Oral Q6H  . DISCONTD: metoprolol  50 mg Oral BID  . DISCONTD: sodium chloride  3 mL Intravenous Q12H    Assessment: 77 yo female on chronic anticoag with coumadin for afib, resumed last night s/p hemiarthroplasty.  Home dose is 2.5mg  daily except 5mg  Mondays & Fridays.  INR = 1.32 today.  Goal of Therapy:  INR 2-3     Plan:  Repeat coumadin 5mg  today & check daily PT/INR Continue heparin 5000 units SQ q8h until INR therapeutic Monitor closely for potential interaction with cipro  Clinton Sawyer, Diamantina Edinger R 10/19/2011,7:36 AM

## 2011-10-19 NOTE — Progress Notes (Signed)
Physical Therapy Evaluation Patient Details Name: Tasha Ball MRN: [id number] DOB: 1924-11-11 Today's Date: 10/19/2011 [id number] Ev2  Problem List:  Patient Active Problem List  Diagnoses  . Atrial fibrillation  . Urinary tract infection  . Dementia  . Hip fracture  . Hip fracture, left    Past Medical History:  Past Medical History  Diagnosis Date  . Urinary tract infection   . Cancer     non-hodgkins lymphoma   . Lymphoma in remission     ca free 5 yrs  . Dementia   . Atrial fibrillation    Past Surgical History:  Past Surgical History  Procedure Date  . Eye surgery     bil.cataract with lens implants    PT Assessment/Plan/Recommendation PT Assessment Clinical Impression Statement: Patient admitted s/p fall with left hip fracture s/p hemiarthroplasty presents with decreased strength, ROM and pain left LE impacting functional mobility and ambulation.  She will benefit from skilled PT in the acute care setting to address these issues and faciliate eventual d/c home after rehab stay. PT Recommendation/Assessment: Patient will need skilled PT in the acute care venue PT Problem List: Decreased strength;Decreased range of motion;Decreased knowledge of use of DME;Decreased activity tolerance;Decreased balance;Pain PT Therapy Diagnosis : Difficulty walking;Acute pain;Generalized weakness PT Plan PT Frequency: Min 5X/week PT Treatment/Interventions: Gait training;DME instruction;Patient/family education;Functional mobility training;Therapeutic exercise;Balance training PT Recommendation Recommendations for Other Services: Rehab consult Follow Up Recommendations: Inpatient Rehab Equipment Recommended: Defer to next venue PT Goals  Acute Rehab PT Goals PT Goal Formulation: With patient Time For Goal Achievement: 2 weeks Pt will go Supine/Side to Sit: with min assist PT Goal: Supine/Side to Sit - Progress: Progressing toward goal Pt will Sit at [facility] of Bed: with  supervision;with unilateral upper extremity support;3-5 min (while participating in simple functional task) PT Goal: Sit at [place] Of Bed - Progress: Progressing toward goal Pt will go Sit to Supine/Side: with min assist PT Goal: Sit to Supine/Side - Progress: Progressing toward goal Pt will Transfer Bed to Chair/Chair to Bed: with min assist PT Transfer Goal: Bed to Chair/Chair to Bed - Progress: Progressing toward goal Pt will Ambulate: 16 - 50 feet;with mod assist;with rolling walker (50 feet) PT Goal: Ambulate - Progress: Progressing toward goal  PT Evaluation Precautions/Restrictions  Precautions Precautions: Posterior Hip Precaution Comments: initiated verbal instruction/education Restrictions LLE Weight Bearing: Weight bearing as tolerated Prior Functioning  Home Living Lives With: Family;Son (and daughter-in-law) Receives Help From: Family Type of Home: House Home Layout: One level Home Access: Stairs to enter Entrance Stairs-Rails: Right Entrance Stairs-Number of Steps: 2-3 Bathroom Shower/Tub: Health visitor: Standard Home Adaptive Equipment: Shower chair with back Prior Function Level of Independence: Independent with basic ADLs;Independent with gait;Independent with transfers;Needs assistance with homemaking Driving: No Cognition Cognition Arousal/Alertness: Awake/alert Overall Cognitive Status: History of cognitive impairments History of Cognitive Impairment: Appears at baseline functioning Orientation Level: Oriented X4 Sensation/Coordination   Extremity Assessment RLE Assessment RLE Assessment: Within Functional Limits LLE Assessment LLE Assessment: Exceptions to WFL LLE AROM (degrees) Overall AROM Left Lower Extremity: Deficits;Due to pain;Due to decreased strength LLE Overall AROM Comments: Patient able to flex hip and knee approx 30-40 degrees in supine AAROM with pain. Mobility (including Balance) Bed Mobility Bed Mobility: Yes Supine  to Sit: 3: Mod assist Supine to Sit Details (indicate cue type and reason): with cues patient able to scoot over and move legs to edge of bed (assist for left leg,)  needed assistance for elevating trunk Sitting -  Scoot to [place] of Bed: 3: Mod assist Sitting - Scoot to [place] of Bed Details (indicate cue type and reason): scooting patient with pad underneath Transfers Transfers: Yes Sit to Stand: 1: +2 Total assist;From bed Sit to Stand Details (indicate cue type and reason): pt= 40%, assist for forward weight shift, lifting off bed Stand to Sit: To chair/3-in-1;With upper extremity assist;1: +2 Total assist Stand to Sit Details: pt= 40% once hands placed back on armrests, facilitation through right hip for flexion and pt able to sit. Stand Pivot Transfers: 1: +2 Total assist Stand Pivot Transfer Details (indicate cue type and reason): with rolling walker stand, step turning to right with pt=60% and complains of pain left LE.  Balance Balance Assessed: Yes Static Sitting Balance Static Sitting - Level of Assistance: 3: Mod assist;2: Max assist;4: Min assist Static Sitting - Comment/# of Minutes: Initially max assist to sit, then progressed to mod with cues for anterior weight shift, tactile cue through abdominals and chin tuck to increase upright positioning.  Needed approx 3-4 minutes with cueing and assist to progress to min assist and supporting self with both UE's Exercise    End of Session PT - End of Session Equipment Utilized During Treatment: Gait belt Activity Tolerance: Patient limited by pain Patient left: in chair;with call bell in reach Nurse Communication: Mobility status for transfers General Behavior During Session: [facility] for tasks performed Cognition: Impaired, at baseline Patient seen for co-evaluation with occupational therapy  WYNN,CYNDI 10/19/2011, 12:16 PM

## 2011-10-19 NOTE — Progress Notes (Signed)
@ Subjective:  Denies SSCP, palpitations or Dyspnea Dementia.  "Didn't know I moved here"  Objective:  Vital Signs in the last 24 hours:       Wt Readings from Last 1 Encounters:  10/17/11 55 kg (121 lb 4.1 oz)    Temp:  [97.4 F (36.3 C)-99 F (37.2 C)] 97.9 F (36.6 C) (11/19 0400) Pulse Rate:  [56-101] 73  (11/19 0600) Resp:  [9-18] 14  (11/19 0600) BP: (80-149)/(51-98) 116/67 mmHg (11/19 0600) SpO2:  [95 %-100 %] 98 % (11/19 0600)  Intake/Output from previous day: 11/18 0701 - 11/19 0700 In: 3022.5 [P.O.:660; I.V.:2012.5; IV Piggyback:350] Out: 580 [Urine:580] Intake/Output from this shift:    Physical Exam: General appearance: alert and no distress Lungs: clear to auscultation bilaterally and normal percussion bilaterally Heart: Irregular pulse S1/S2 with no murmur.  PMI normal Extremities: extremities normal, atraumatic, no cyanosis or edema Pulses: 2+ and symmetric Neurologic: Grossly normal S/P left hip fracture  Lab Results:  Basename 10/19/11 0315 10/18/11 0510  WBC 11.6* 9.7  HGB 12.6 12.5  PLT 130* 123*    Basename 10/19/11 0315 10/18/11 0510  NA 136 134*  K 3.7 3.7  CL 99 97  CO2 27 30  GLUCOSE 117* 111*  BUN 13 18  CREATININE 0.75 0.84    Basename 10/17/11 0305 10/16/11 1927  TROPONINI <0.30 <0.30   Hepatic Function Panel No results found for this basename: PROT,ALBUMIN,AST,ALT,ALKPHOS,BILITOT,BILIDIR,IBILI in the last 72 hours No results found for this basename: CHOL in the last 72 hours No results found for this basename: PROTIME in the last 72 hours  Imaging: Dg Pelvis Portable  10/18/2011  *RADIOLOGY REPORT*  Clinical Data: Postoperative for left hemiarthroplasty.  PORTABLE PELVIS  Comparison: 10/16/2011  Findings: A bipolar left hip hemiarthroplasty is in place, without new fracture or evidence of complicating feature.  Expected gas noted in the soft tissues.  IMPRESSION:  1.  Left hip bipolar hemiarthroplasty is in place, without  complicating feature observed.  Original Report Authenticated By: Dellia Cloud, M.D.    Cardiac Studies:    Telemetry:  Afib with good rate control and no long pauses  Medications:     . ceFAZolin (ANCEF) IV  1 g Intravenous Q6H  . ciprofloxacin  250 mg Oral BID  . docusate sodium  100 mg Oral BID  . docusate sodium  100 mg Oral BID  . donepezil  23 mg Oral QHS  . escitalopram  5 mg Oral Daily  . ferrous sulfate  325 mg Oral TID PC  . heparin subcutaneous  5,000 Units Subcutaneous Q8H  . levothyroxine  75 mcg Oral Daily  . losartan  50 mg Oral Daily  . memantine  10 mg Oral BID  . metoprolol  25 mg Oral TID  . sodium chloride  250 mL Intravenous Once  . Vitamin D (Ergocalciferol)  50,000 Units Oral Q7 days  . warfarin  5 mg Oral ONCE-1800  . warfarin  5 mg Oral ONCE-1800  . DISCONTD: amLODipine  5 mg Oral Daily  . DISCONTD: diltiazem  30 mg Oral Q6H  . DISCONTD: metoprolol  50 mg Oral BID  . DISCONTD: sodium chloride  3 mL Intravenous Q12H    Assessment/Plan:  Atrial Fibrillation:  Rate control ok on lopresser 25 q 8. Continue anticoagulation with coumadin for afib and S/P hip fracture Ok to do PT/OT.  Patient indicates [facility] rehab.  If BP soft can lower ARB to maintain beta blocker dose Will sign off.  Call if needed  Charlton Haws 10/19/2011, 7:50 AM

## 2011-10-19 NOTE — Consult Note (Signed)
Physical Medicine and Rehabilitation Consult Reason for Consult:left hip pain Referring Phsyician: Charlann Boxer    HPI: Tasha Ball is an 77 y.o. female with history of dementia, recent UTI, who tripped and fell on 11/16/2 and sustained acute left femoral neck fracture.  Evaluated by [name] and patient underwent left hip Hemi on 10/18/11.  Post op with elevated heart rate treated with IV Cardizem.  [place] cards consulted and lopressor increased to q 8 hrs.  Pt  Is WBAT and on coumadin for DVT prophylaxis.  For therapy evals today.  MD recommending CIR.  @ROS @ Review of Systems  Musculoskeletal: Positive for joint pain.  All other systems reviewed and are negative.    Past Medical History  Diagnosis Date  . Urinary tract infection   . Cancer     non-hodgkins lymphoma   . Lymphoma in remission     ca free 5 yrs  . Dementia   . Atrial fibrillation    Past Surgical History  Procedure Date  . Eye surgery     bil.cataract with lens implants   Family history:  Father with CAD     Sister and Brother with Cancer.  Social History: Widowed. Lives with family who provides 24 hrs supervision  (daughter-in-law a Charity fundraiser who works weekends and son a Surveyor, minerals). Smoked a few years as a teenager. She has never used smokeless tobacco. Drinks a glass of wine couple times a week?  Does  use illicit drugs.  Does not drive.  Allergies: No Known Allergies  Prior to Admission medications   Medication Sig Start Date End Date Taking? Authorizing Provider  alendronate (FOSAMAX) 70 MG tablet Take 70 mg by mouth every 7 (seven) days. Take with a full glass of water on an empty stomach.    Yes Historical Provider, MD  amLODipine (NORVASC) 5 MG tablet Take 5 mg by mouth daily.     Yes Historical Provider, MD  ciprofloxacin (CIPRO) 250 MG tablet Take 250 mg by mouth 2 (two) times daily.     Yes Historical Provider, MD  donepezil (ARICEPT) 23 MG TABS tablet Take 23 mg by mouth at bedtime.     Yes Historical  Provider, MD  escitalopram (LEXAPRO) 10 MG tablet Take 5 mg by mouth daily.     Yes Historical Provider, MD  levothyroxine (SYNTHROID, LEVOTHROID) 75 MCG tablet Take 75 mcg by mouth daily.     Yes Historical Provider, MD  losartan (COZAAR) 100 MG tablet Take 50 mg by mouth daily.     Yes Historical Provider, MD  memantine (NAMENDA) 10 MG tablet Take 10 mg by mouth 2 (two) times daily.     Yes Historical Provider, MD  metoprolol (LOPRESSOR) 50 MG tablet Take 50 mg by mouth 2 (two) times daily.     Yes Historical Provider, MD  Vitamin D, Ergocalciferol, (DRISDOL) 50000 UNITS CAPS Take 50,000 Units by mouth every 7 (seven) days.     Yes Historical Provider, MD  warfarin (COUMADIN) 5 MG tablet Take 2.5-5 mg by mouth daily. 0.5 tab daily except 1 tab on Mondays and Fridays    Yes Historical Provider, MD   Scheduled Medications:    . ceFAZolin (ANCEF) IV  1 g Intravenous Q6H  . ciprofloxacin  250 mg Oral BID  . docusate sodium  100 mg Oral BID  . docusate sodium  100 mg Oral BID  . donepezil  23 mg Oral QHS  . escitalopram  5 mg Oral Daily  . ferrous sulfate  325 mg Oral TID PC  . heparin subcutaneous  5,000 Units Subcutaneous Q8H  . levothyroxine  75 mcg Oral Daily  . losartan  50 mg Oral Daily  . memantine  10 mg Oral BID  . metoprolol  25 mg Oral TID  . sodium chloride  250 mL Intravenous Once  . Vitamin D (Ergocalciferol)  50,000 Units Oral Q7 days  . warfarin  5 mg Oral ONCE-1800  . warfarin  5 mg Oral ONCE-1800  . DISCONTD: amLODipine  5 mg Oral Daily  . DISCONTD: diltiazem  30 mg Oral Q6H  . DISCONTD: metoprolol  50 mg Oral BID  . DISCONTD: sodium chloride  3 mL Intravenous Q12H   PRN MED's: acetaminophen, acetaminophen, bisacodyl, bisacodyl, fentaNYL, lip balm, magnesium hydroxide, menthol-cetylpyridinium, methocarbamol(ROBAXIN) IV, methocarbamol, metoCLOPramide (REGLAN) injection, metoCLOPramide, ondansetron (ZOFRAN) IV, ondansetron, phenol, polyethylene glycol, promethazine,  senna, sodium phosphate, traMADol-acetaminophen, DISCONTD: acetaminophen, DISCONTD: acetaminophen, DISCONTD: fentaNYL, DISCONTD: HYDROcodone-acetaminophen DISCONTD: ondansetron (ZOFRAN) IV, DISCONTD: ondansetron, DISCONTD: sodium chloride, DISCONTD: sodium chloride irrigation Home:    Functional History:   Functional Status:  Mobility:moderate assist for basic bed mobility.  Total assist (40%) for basic sit to stand transfers.          ADL:    Cognition: Cognition Orientation Level: Oriented to person;Oriented to place;Oriented to situation Cognition Orientation Level: Oriented to person;Oriented to place;Oriented to situation  Blood pressure 124/77, pulse 76, temperature 98.3 F (36.8 C), temperature source Oral, resp. rate 14, height 5\' 6"  (1.676 m), weight 55 kg (121 lb 4.1 oz), SpO2 98.00%. @PHYSEXAMBYAGE2 @ Physical Exam  Constitutional: She is oriented to person, place, and time. She appears well-developed.  HENT:  Head: Normocephalic.  Eyes: Pupils are equal, round, and reactive to light.  Neck: Normal range of motion.  Cardiovascular: Intact distal pulses.   Pulmonary/Chest: Effort normal.  Abdominal: Soft.  Neurological: She is oriented to person, place, and time. A cranial nerve deficit is present.       Upper ext strength 4-5/5.  Right LE 3+ prox to 4+/5 distally.  Left LE 1/5 prox to 4/5 distally  Some short term memory deficits.  HOH.  Fairly good insight and awareness.  Follows all simple commands .  Skin:       Left hip wound clean and intact with minimal drainage.  Left hip is appropriately tender.     Results for orders placed during the hospital encounter of 10/16/11 (from the past 24 hour(s))  TSH     Status: Normal   Collection Time   10/19/11  3:15 AM      Component Value Range   TSH 3.122  0.350 - 4.500 (uIU/mL)  PROTIME-INR     Status: Abnormal   Collection Time   10/19/11  3:15 AM      Component Value Range   Prothrombin Time 16.6 (*) 11.6 -  15.2 (seconds)   INR 1.32  0.00 - 1.49   CBC     Status: Abnormal   Collection Time   10/19/11  3:15 AM      Component Value Range   WBC 11.6 (*) 4.0 - 10.5 (K/uL)   RBC 3.88  3.87 - 5.11 (MIL/uL)   Hemoglobin 12.6  12.0 - 15.0 (g/dL)   HCT 19.1  47.8 - 29.5 (%)   MCV 98.7  78.0 - 100.0 (fL)   MCH 32.5  26.0 - 34.0 (pg)   MCHC 32.9  30.0 - 36.0 (g/dL)   RDW 62.1  30.8 - 65.7 (%)  Platelets 130 (*) 150 - 400 (K/uL)  BASIC METABOLIC PANEL     Status: Abnormal   Collection Time   10/19/11  3:15 AM      Component Value Range   Sodium 136  135 - 145 (mEq/L)   Potassium 3.7  3.5 - 5.1 (mEq/L)   Chloride 99  96 - 112 (mEq/L)   CO2 27  19 - 32 (mEq/L)   Glucose, Bld 117 (*) 70 - 99 (mg/dL)   BUN 13  6 - 23 (mg/dL)   Creatinine, Ser 1.61  0.50 - 1.10 (mg/dL)   Calcium 8.4  8.4 - 09.6 (mg/dL)   GFR calc non Af Amer 75 (*) >90 (mL/min)   GFR calc Af Amer 87 (*) >90 (mL/min)   Dg Pelvis Portable  10/18/2011  *RADIOLOGY REPORT*  Clinical Data: Postoperative for left hemiarthroplasty.  PORTABLE PELVIS  Comparison: 10/16/2011  Findings: A bipolar left hip hemiarthroplasty is in place, without new fracture or evidence of complicating feature.  Expected gas noted in the soft tissues.  IMPRESSION:  1.  Left hip bipolar hemiarthroplasty is in place, without complicating feature observed.  Original Report Authenticated By: Dellia Cloud, M.D.    Assessment/Plan: Diagnosis: Left femoral neck fracture s/p  Hip hemi arthroplasty. 1. Does the need for close, 24 hr/day medical supervision in concert with the patient's rehab needs make it unreasonable for this patient to be served in a less intensive setting? Yes 2. Co-Morbidities requiring supervision/potential complications: afib, tachycardia, dementia 3. Due to bladder management, bowel management, safety, skin/wound care, pain management and patient education, does the patient require 24 hr/day rehab nursing? Yes 4. Does the patient require  coordinated care of a physician, rehab nurse, PT (1-2 hrs/day, 5 days/week) and OT (1-2 hrs/day, 5 days/week) to address physical and functional deficits in the context of the above medical diagnosis(es)? Yes Addressing deficits in the following areas: balance, bathing, cognition, dressing, endurance, grooming, locomotion, strength, swallowing, toileting and transferring 5. Can the patient actively participate in an intensive therapy program of at least 3 hrs of therapy per day at least 5 days per week? Yes 6. The potential for patient to make measurable gains while on inpatient rehab is good 7. Anticipated functional outcomes upon discharge from inpatients are supervision to min assist PT, supervision to minimal assist OT.  Could consider SLP as well 8. Estimated rehab length of stay to reach the above functional goals is: 2 weeks plus 9. Does the patient have adequate social supports to accommodate these discharge functional goals? Yes 10. Anticipated D/C setting: Home 11. Anticipated post D/C treatments: HH therapy 12. Overall Rehab/Functional Prognosis: good  RECOMMENDATIONS: This patient's condition is appropriate for continued rehabilitative care in the following setting: CIR Patient has agreed to participate in recommended program. Yes Note that insurance prior authorization may be required for reimbursement for recommended care.  Comment:  Will follow along for medical stability.  I believe she has the cognitive ability to succeed in the inpatient rehab setting.    10/19/2011

## 2011-10-20 ENCOUNTER — Encounter (HOSPITAL_COMMUNITY): Payer: Self-pay | Admitting: Orthopedic Surgery

## 2011-10-20 DIAGNOSIS — Z22322 Carrier or suspected carrier of Methicillin resistant Staphylococcus aureus: Secondary | ICD-10-CM

## 2011-10-20 DIAGNOSIS — I10 Essential (primary) hypertension: Secondary | ICD-10-CM

## 2011-10-20 HISTORY — DX: Essential (primary) hypertension: I10

## 2011-10-20 HISTORY — DX: Carrier or suspected carrier of methicillin resistant Staphylococcus aureus: Z22.322

## 2011-10-20 LAB — CBC
MCH: 33.1 pg (ref 26.0–34.0)
MCV: 97.5 fL (ref 78.0–100.0)
Platelets: 122 10*3/uL — ABNORMAL LOW (ref 150–400)
RBC: 3.6 MIL/uL — ABNORMAL LOW (ref 3.87–5.11)
RDW: 13.6 % (ref 11.5–15.5)
WBC: 10 10*3/uL (ref 4.0–10.5)

## 2011-10-20 LAB — PROTIME-INR: Prothrombin Time: 19 seconds — ABNORMAL HIGH (ref 11.6–15.2)

## 2011-10-20 LAB — BASIC METABOLIC PANEL
CO2: 30 mEq/L (ref 19–32)
Calcium: 8.3 mg/dL — ABNORMAL LOW (ref 8.4–10.5)
Chloride: 98 mEq/L (ref 96–112)
Creatinine, Ser: 0.78 mg/dL (ref 0.50–1.10)
GFR calc Af Amer: 86 mL/min — ABNORMAL LOW (ref >90)
Sodium: 134 mEq/L — ABNORMAL LOW (ref 135–145)

## 2011-10-20 MED ORDER — WARFARIN SODIUM 5 MG PO TABS
5.0000 mg | ORAL_TABLET | Freq: Once | ORAL | Status: AC
  Administered 2011-10-20: 5 mg via ORAL
  Filled 2011-10-20: qty 1

## 2011-10-20 MED ORDER — SODIUM CHLORIDE 0.9 % IV SOLN: INTRAVENOUS | Status: DC

## 2011-10-20 NOTE — Progress Notes (Signed)
CSW spoke with the pt and completed a psychosocial assessment (pls see shadow chart). Pt states she has support from her son and daughter and the plan is for her to go to rehab at [facility]. CSW spoke with the pts nurse who reports the pt has not been evaluated yet to see if she will go to [facility] for rehab. CSW will complete an FL-2 just in case pt will need SNF and place in the chart this afternoon. CSW will continue to offer support.  Patrice Paradise, LCSWA 10/20/2011 9:25 AM [phone]

## 2011-10-20 NOTE — Progress Notes (Signed)
As per rehab MD consult, pt would be a good CIR candidate. Discussed via telephone with pt's son, Nedra Hai regarding tolerance, insurance and what CIR involves. Family agree that CIR would be beneficial for patient and agreeable. Insurance approval started with [employer] and awaiting decision. Anticipate not having final decision until 10/21/11.  Thanks for consult. Toni Amend [phone]

## 2011-10-20 NOTE — Progress Notes (Signed)
Patient ID: Tasha Ball, female   DOB: [birth year], 77 y.o.   MRN: [id number] Subjective: 2 Days Post-Op Procedure(s) (LRB): ARTHROPLASTY BIPOLAR HIP (Left)    Patient reports pain as mild. Patient remains as her pre-operative state of pleasant dementia  Objective:   VITALS:   Filed Vitals:   10/20/11 0700  BP: 121/80  Pulse: 80  Temp: 99.2 F (37.3 C)  Resp: 17    Neurovascular intact Incision: dressing C/D/I  LABS  Basename 10/20/11 0505 10/19/11 0315 10/18/11 0510  HGB 11.9* 12.6 12.5  HCT 35.1* 38.3 37.7  WBC 10.0 11.6* 9.7  PLT 122* 130* 123*     Basename 10/20/11 0505 10/19/11 0315 10/18/11 0510  NA 134* 136 134*  K 3.8 3.7 3.7  BUN 11 13 18   CREATININE 0.78 0.75 0.84  GLUCOSE 107* 117* 111*     Basename 10/20/11 0505 10/19/11 0315  LABPT -- --  INR 1.56* 1.32     Assessment/Plan: 2 Days Post-Op Procedure(s) (LRB): ARTHROPLASTY BIPOLAR HIP (Left)   Up with therapy.   Plan is for her to be transferred to [facility] prior to returning to home.   RTC 2 weeks to see [facility], [phone]. Current Aquacell dressing will remain on until 11/26 then can be removed and dry gauze applied daily as needed.  Call with questions, [phone]

## 2011-10-20 NOTE — Progress Notes (Signed)
Physical Therapy Treatment Patient Details Name: Tasha Ball MRN: [id number] DOB: 1924-11-05 Today's Date: 10/20/2011 11:45 - 12:20 1 gt 1 ta PT Assessment/Plan  PT - Assessment/Plan Comments on Treatment Session: pleasantly confused and required increased instruction on THP and WBAT....Marland KitchenMarland Kitchenpt states she "feels better" PT Plan: Discharge plan remains appropriate PT Frequency: Min 5X/week Follow Up Recommendations: Inpatient Rehab Equipment Recommended: Rolling walker with 5" wheels PT Goals  Acute Rehab PT Goals PT Goal Formulation: With patient Pt will go Supine/Side to Sit: with min assist PT Goal: Supine/Side to Sit - Progress: Progressing toward goal Pt will Sit at [facility] of Bed: with supervision PT Goal: Sit at [facility] Of Bed - Progress: Progressing toward goal Pt will go Sit to Supine/Side: with min assist PT Goal: Sit to Supine/Side - Progress: Progressing toward goal Pt will Transfer Bed to Chair/Chair to Bed: with min assist PT Transfer Goal: Bed to Chair/Chair to Bed - Progress: Progressing toward goal Pt will Ambulate: 51 - 150 feet;with mod assist;with rolling walker PT Goal: Ambulate - Progress: Progressing toward goal  PT Treatment Precautions/Restrictions  Precautions Precautions: Posterior Hip Precaution Booklet Issued: No Precaution Comments: initiated verbal instruction/education Required Braces or Orthoses: No Restrictions Weight Bearing Restrictions: No LLE Weight Bearing: Weight bearing as tolerated Mobility (including Balance) Bed Mobility Supine to Sit: 1: +2 Total assist Supine to Sit Details (indicate cue type and reason): total assist + 2 pt 25% with increased time and HOB increased 55" Sitting - Scoot to Edge of Bed: 1: +2 Total assist Sitting - Scoot to Edge of Bed Details (indicate cue type and reason): total assist + 2 pt 10% with great difficulty Transfers Transfers: Yes Sit to Stand: 1: +2 Total assist;From bed (from bed) Sit to Stand Details  (indicate cue type and reason): Total assist + 2 pt 30% with 75% VC's on THP and hand placement Stand to Sit: 1: +2 Total assist;To chair/3-in-1 Stand to Sit Details: total assist + 2 pt 40% with 75% VC's to avoid hip flex > 90" and to extend L LE prior to sit Stand Pivot Transfers: 1: +2 Total assist Stand Pivot Transfer Details (indicate cue type and reason): Total assist + 2 pt 40% with 75% VC's for turn completion and safety Ambulation/Gait Ambulation/Gait: Yes Ambulation/Gait Assistance: 1: +2 Total assist Ambulation/Gait Assistance Details (indicate cue type and reason): Total assist + 2 pt 50% with 75% VC's on proper sequencing and proper wealker to self distance Ambulation Distance (Feet): 20 Feet Assistive device: Rolling walker Gait Pattern: Step-to pattern;Decreased weight shift to left Stairs: No Wheelchair Mobility Wheelchair Mobility: No    Exercise    End of Session PT - End of Session Equipment Utilized During Treatment: Gait belt Activity Tolerance: Patient tolerated treatment well Patient left: in chair;with call bell in reach Nurse Communication: Mobility status for ambulation General Behavior During Session: [facility] for tasks performed Cognition:  (pleasantly confused to current situation, increased VC';s fo)  Felecia Shelling PTA [facility]  Acute  Rehab Pager     [phone]

## 2011-10-20 NOTE — Progress Notes (Signed)
Patient ID: Tasha Ball, female   DOB: 1924-08-23, 77 y.o.   MRN: [id number] Subjective: On contact isolation for mrsa positive nasal swab. Appreciate rehab consult and I happy she could possibly go to CIR (pending insurance/bed availability).  She has no new complaints today.  Objective: Vital signs in last 24 hours: Temp:  [97.9 F (36.6 C)-99.4 F (37.4 C)] 99.2 F (37.3 C) (11/20 0700) Pulse Rate:  [74-97] 80  (11/20 0700) Resp:  [14-17] 17  (11/20 0700) BP: (109-136)/(69-80) 121/80 mmHg (11/20 0700) SpO2:  [97 %-99 %] 97 % (11/20 0700) Weight:  [58.242 kg (128 lb 6.4 oz)] 128 lb 6.4 oz (58.242 kg) (11/19 2000) Weight change:  Last BM Date: 10/16/11  Intake/Output from previous day: 11/19 0701 - 11/20 0700 In: 1330 [P.O.:960; I.V.:370] Out: 400 [Urine:400] Intake/Output this shift:    General appearance: alert and cooperative Resp: clear to auscultation bilaterally Cardio: regular rate and rhythm GI: soft, non-tender; bowel sounds normal; no masses,  no organomegaly Extremities: extremities normal, atraumatic, no cyanosis or edema Neurologic: Grossly normal   Lab Results:  Basename 10/20/11 0505 10/19/11 0315  WBC 10.0 11.6*  HGB 11.9* 12.6  HCT 35.1* 38.3  PLT 122* 130*   BMET  Basename 10/20/11 0505 10/19/11 0315  NA 134* 136  K 3.8 3.7  CL 98 99  CO2 30 27  GLUCOSE 107* 117*  BUN 11 13  CREATININE 0.78 0.75  CALCIUM 8.3* 8.4   CMET CMP     Component Value Date/Time   NA 134* 10/20/2011 0505   K 3.8 10/20/2011 0505   CL 98 10/20/2011 0505   CO2 30 10/20/2011 0505   GLUCOSE 107* 10/20/2011 0505   BUN 11 10/20/2011 0505   CREATININE 0.78 10/20/2011 0505   CALCIUM 8.3* 10/20/2011 0505   PROT 6.5 11/03/2010 1132   ALBUMIN 4.2 11/03/2010 1132   AST 35 11/03/2010 1132   ALT 24 11/03/2010 1132   ALKPHOS 45 11/03/2010 1132   BILITOT 1.4* 11/03/2010 1132   GFRNONAA 74* 10/20/2011 0505   GFRAA 86* 10/20/2011 0505     Studies/Results: Dg Pelvis  Portable  10/18/2011  *RADIOLOGY REPORT*  Clinical Data: Postoperative for left hemiarthroplasty.  PORTABLE PELVIS  Comparison: 10/16/2011  Findings: A bipolar left hip hemiarthroplasty is in place, without new fracture or evidence of complicating feature.  Expected gas noted in the soft tissues.  IMPRESSION:  1.  Left hip bipolar hemiarthroplasty is in place, without complicating feature observed.  Original Report Authenticated By: Dellia Cloud, M.D.    Medications:  I have reviewed meds.  Assessment/Plan:  Principal Problem:  *Hip fracture, left-post op day 2.  Doing fairly well. Foley out.  Hopefully to rehab soon.  I would consider her medically cleared to go to rehab. Patient Active Problem List  Diagnoses  . Atrial fibrillation on coumadin,  On metoprolol. Vitals okay.  . Urinary tract infection  Finish 5 days cipro which should be done after dosing on 10/22/11  . Dementia  On dual therapy            LOS: 4 days   Ezequiel Kayser, MD 10/20/2011, 7:14 AM

## 2011-10-20 NOTE — Progress Notes (Signed)
CSW was in the process of completing an FL-2 for pt for SNF. CSW notes that pt has been evaluated and accepted to CIR based on the note by RN Oletta Darter. CSW signing off, please re-consult is needed.  Ladene Artist N,LCSWA 10/20/2011 11:34 AM [phone]

## 2011-10-20 NOTE — Progress Notes (Signed)
ANTICOAGULATION CONSULT NOTE - Follow Up Consult  Pharmacy Consult for Coumadin Indication: Afib, VTE prophylaxis s/p bipolar hip arthroplasty  No Known Allergies  Patient Measurements: Height: 5\' 2"  (157.5 cm) Weight: 128 lb 6.4 oz (58.242 kg) (bed weight) IBW/kg (Calculated) : 50.1  Adjusted Body Weight:   Vital Signs: Temp: 99.2 F (37.3 C) (11/20 0700) Temp src: Oral (11/20 0700) BP: 121/80 mmHg (11/20 0700) Pulse Rate: 80  (11/20 0700)  Labs:  Basename 10/20/11 0505 10/19/11 0315 10/18/11 0510  HGB 11.9* 12.6 --  HCT 35.1* 38.3 37.7  PLT 122* 130* 123*  APTT -- -- --  LABPROT 19.0* 16.6* 17.0*  INR 1.56* 1.32 1.36  HEPARINUNFRC -- -- --  CREATININE 0.78 0.75 0.84  CKTOTAL -- -- --  CKMB -- -- --  TROPONINI -- -- --   Estimated Creatinine Clearance: 40.7 ml/min (by C-G formula based on Cr of 0.78).   Medications:  Scheduled:     . Chlorhexidine Gluconate Cloth  6 each Topical Q0600  . ciprofloxacin  250 mg Oral BID  . docusate sodium  100 mg Oral BID  . docusate sodium  100 mg Oral BID  . donepezil  23 mg Oral QHS  . escitalopram  5 mg Oral Daily  . ferrous sulfate  325 mg Oral TID PC  . Flora-Q  1 capsule Oral Daily  . heparin subcutaneous  5,000 Units Subcutaneous Q8H  . levothyroxine  75 mcg Oral Daily  . losartan  50 mg Oral Daily  . memantine  10 mg Oral BID  . metoprolol  25 mg Oral TID  . mupirocin ointment  1 application Nasal BID  . Vitamin D (Ergocalciferol)  50,000 Units Oral Q7 days  . warfarin  5 mg Oral ONCE-1800    Assessment:  77 yo female on chronic anticoag with coumadin for afib, resumed 11/18 s/p hemiarthroplasty.    Home dose is 2.5mg  daily except 5mg  Mondays & Fridays.    Drug interaction with Cipro should result in an increase in INR. Five day treatment of cipro for UTI should be done after dosing on 10/22/11.   No bleeding/complications reported.  Goal of Therapy:  INR 2-3   Plan:  Repeat coumadin 5mg  po once today.  Follow up INR in AM. Continue heparin 5000 units SQ q8h until INR therapeutic  [name], PharmD Pager: [phone] 10/20/2011 8:54 AM

## 2011-10-21 LAB — PROTIME-INR
INR: 1.73 — ABNORMAL HIGH (ref 0.00–1.49)
Prothrombin Time: 20.6 seconds — ABNORMAL HIGH (ref 11.6–15.2)

## 2011-10-21 MED ORDER — WARFARIN SODIUM 5 MG PO TABS
5.0000 mg | ORAL_TABLET | Freq: Once | ORAL | Status: AC
  Administered 2011-10-21: 5 mg via ORAL
  Filled 2011-10-21: qty 1

## 2011-10-21 NOTE — Progress Notes (Signed)
Physical Therapy Treatment Patient Details Name: Tasha Ball MRN: [id number] DOB: 05-10-1924 Today's Date: 10/21/2011 1335 1400 1Ta, 1Gt  PT Assessment/Plan  PT - Assessment/Plan Comments on Treatment Session: Patient progressing well with only +1 assist needed today.  Feel CIR appropriate due to will have 24 hr assist at d/c. PT Plan: Discharge plan remains appropriate PT Frequency: Min 5X/week Follow Up Recommendations: Inpatient Rehab Equipment Recommended: Defer to next venue PT Goals  Acute Rehab PT Goals PT Goal: Supine/Side to Sit - Progress: Progressing toward goal PT Goal: Sit at Edge Of Bed - Progress: Progressing toward goal PT Goal: Sit to Supine/Side - Progress: Progressing toward goal PT Goal: Ambulate - Progress: Progressing toward goal  PT Treatment Precautions/Restrictions  Precautions Precautions: Posterior Hip Precaution Booklet Issued: No Precaution Comments: initiated verbal instruction/education Required Braces or Orthoses: No Restrictions Weight Bearing Restrictions: No LLE Weight Bearing: Weight bearing as tolerated Mobility (including Balance) Bed Mobility Bed Mobility: Yes Supine to Sit: 3: Mod assist;HOB flat Supine to Sit Details (indicate cue type and reason): cues for technique Sitting - Scoot to Edge of Bed: 5: Supervision Sitting - Scoot to [place] of Bed Details (indicate cue type and reason): cues for technique Transfers Sit to Stand: 3: Mod assist;With upper extremity assist;From bed Sit to Stand Details (indicate cue type and reason): cues for ant weight shift and hand placement Stand to Sit: 4: Min assist;To bed Stand to Sit Details: cues for positioning and hand over hand assist for correct hand placement on bed Ambulation/Gait Ambulation/Gait Assistance: 3: Mod assist Ambulation/Gait Assistance Details (indicate cue type and reason): assist and cues for pushing walker forward due to tends to lean back and step too far forward in  walker. Ambulation Distance (Feet): 50 Feet Assistive device: Rolling walker Gait Pattern: Decreased stance time - left;Decreased step length - right;Left steppage;Right steppage (improved with cues for decreased cognitive effort)  Static Standing Balance Static Standing - Balance Support: Bilateral upper extremity supported Static Standing - Level of Assistance: 3: Mod assist (due to posterior lean) Exercise  Total Joint Exercises Ankle Circles/Pumps: AROM;Left;Supine;20 reps Short Arc Quad: AROM;Left;10 reps;Supine Heel Slides: Left;AAROM;10 reps;Supine Hip ABduction/ADduction: AAROM;Both;10 reps;Supine End of Session PT - End of Session Equipment Utilized During Treatment: Gait belt Activity Tolerance: Patient tolerated treatment well Patient left: in bed;with call bell in reach;with family/visitor present General Behavior During Session: [facility] for tasks performed Cognition: [facility] for tasks performed  [facility] 10/21/2011, 2:51 PM

## 2011-10-21 NOTE — Progress Notes (Signed)
Occupational Therapy Treatment Patient Details Name: Tasha Ball MRN: [id number] DOB: [birth year] Today's Date: 10/21/2011 1042 1114 2 [place]  OT Assessment/Plan OT Assessment/Plan OT Plan: Discharge plan remains appropriate OT Frequency: Min 1X/week Follow Up Recommendations: Inpatient Rehab Equipment Recommended: Defer to next venue OT Goals ADL Goals ADL Goal: Grooming - Progress: Met ADL Goal: Lower Body Bathing - Progress: Progressing toward goals ADL Goal: Upper Body Dressing - Progress: Progressing toward goals ADL Goal: Lower Body Dressing - Progress: Progressing toward goals ADL Goal: Toilet Transfer - Progress: Progressing toward goals ADL Goal: Toileting - Hygiene - Progress: Not addressed Miscellaneous OT Goals OT Goal: Miscellaneous Goal #1 - Progress: Progressing toward goals  OT Treatment Precautions/Restrictions  Precautions Precautions: Posterior Hip Restrictions LLE Weight Bearing: Weight bearing as tolerated   ADL ADL Grooming: Performed;Teeth care;Minimal assistance;Other (comment) (min guard) Where Assessed - Grooming: Standing at sink  Lower Body Bathing: Simulated;Moderate assistance;Other (comment) (educated on/simulated with reacher) Lower Body Bathing Details (indicate cue type and reason): min cues for using reacher and for THPs Where Assessed - Lower Body Bathing: Sit to stand from chair  Lower Body Dressing: Simulated;Other (comment);Moderate assistance (pants only with reacher (simulated with pillowcase)) Lower Body Dressing Details (indicate cue type and reason): min cues for reacher and thps Where Assessed - Lower Body Dressing: Sit to stand from chair  Toilet Transfer: Simulated;Minimal assistance;Other (comment) (after mod a for sit to stand; pt leans posteriorly.  recline) Toilet Transfer Details (indicate cue type and reason): min cues Toilet Transfer Method: Ambulating Equipment Used: Rolling walker ADL Comments: needs reinforcement  with thps, but did well with new learning with reacher. Mobility  Bed Mobility Bed Mobility: Yes Supine to Sit: 3: Mod assist Supine to Sit Details (indicate cue type and reason): pt 60% Sitting - Scoot to Edge of Bed: 3: Mod assist Transfers Transfers: Yes Sit to Stand: 3: Mod assist Exercises    End of Session OT - End of Session Equipment Utilized During Treatment: Gait belt Activity Tolerance: Patient tolerated treatment well Patient left: in chair;Other (comment);with call bell in reach (chair alarm used) General Behavior During Session: [facility] for tasks performed Cognition: Impaired, at baseline  Wyndham Santilli [phone] 10/21/2011, 11:48 AM

## 2011-10-21 NOTE — PMR Pre-admission (Signed)
PMR Admission Coordinator Pre-Admission Assessment  Patient:  Tasha Ball is an 77 y.o., female MRN:  [id number] DOB:  [birth year]  Insurance Information: HMO:X     PRIMARY:Blue Medicare      Policy#:[id number]       Subscriber:patient CM Name: Lynnell Catalan     Phone#:[phone]     WGN#:[phone] Pre-Cert#:will be given when patient officially admitted to [facility]      Employer:retired Benefits:  Phone #:[phone]     Name:Jasmine Eff. Date:11/30/10     Deduct:0      Out of Pocket Max:$3400/met$167.43      Life Max:0 CIR:$170/day 1-6; $0/day 7-      SNF:100days limit; $0/day 1-10; $50/day 11-100 Outpatient: no visit limit     Co-Pay:$35/visit Home Health:100%      Co-Pay:0 DME:80%     Co-Pay:20%  Providers:in network    Current Medical History:   Patient Admitting Diagnosis: Left femoral neck fracture with Left hip hemi on 10/18/11  History of Present Illness: Pt admitted to [facility] on 10/16/11 after fall and Left hip fracture. Dr Charlann Boxer performed left hip hemi on 10/18/11 and patient tolerated procedure well but subsequently had some elevated HR issues which have resolved. Patients Past Medical History:   Past Medical History  Diagnosis Date  . Urinary tract infection   . Cancer     non-hodgkins lymphoma   . Lymphoma in remission     ca free 5 yrs  . Dementia   . Atrial fibrillation    Family Medical History:  family history is not on file.   Height and Weight Height: 5\' 2"  (157.5 cm) Weight: 48.898 kg (107 lb 12.8 oz) (Standing Scale B) Type of Weight: Actual BSA (Calculated - sq m): 1.6 sq meters BMI (Calculated): 23.5  Weight in (lb) to have BMI = 25: 136.4  Glascow Coma Scale:     Prior Rehab/Hospitalizations: no prior rehab admissions  Medications PTA Medications:   Medications Prior to Admission  Medication Dose Route Frequency Provider Last Rate Last Dose  . 0.9 %  sodium chloride infusion   Intravenous Continuous Ezequiel Kayser, MD      . acetaminophen  (TYLENOL) tablet 650 mg  650 mg Oral Q6H PRN [name]   650 mg at 10/20/11 1241   Or  . acetaminophen (TYLENOL) suppository 650 mg  650 mg Rectal Q6H PRN [name]      . bisacodyl (DULCOLAX) EC tablet 10 mg  10 mg Oral Daily PRN [name]       Or  . bisacodyl (DULCOLAX) suppository 10 mg  10 mg Rectal Daily PRN [name]      . ceFAZolin (ANCEF) IVPB 1 g/50 mL premix  1 g Intravenous Q6H [name]   1 g at 10/19/11 0520  . Chlorhexidine Gluconate Cloth 2 % PADS 6 each  6 each Topical Q0600 [name], MD   6 each at 10/24/11 0532  . docusate sodium (COLACE) capsule 100 mg  100 mg Oral BID [name]   100 mg at 10/25/11 2131  . donepezil (ARICEPT) tablet 23 mg  23 mg Oral QHS [name], MD   23 mg at 10/25/11 2131  . escitalopram (LEXAPRO) tablet 5 mg  5 mg Oral Daily [name], MD   5 mg at 10/25/11 1025  . ferrous sulfate tablet 325 mg  325 mg Oral TID PC [name]   325 mg at  10/25/11 1746  . Flora-Q (FLORA-Q) Capsule 1 capsule  1 capsule Oral Daily Ezequiel Kayser, MD   1 capsule at 10/25/11 1024  . levothyroxine (SYNTHROID, LEVOTHROID) tablet 75 mcg  75 mcg Oral Daily [name], MD   75 mcg at 10/25/11 1024  . lip balm (CARMEX) ointment   Topical PRN Dione Booze, MD      . lip balm (CARMEX) ointment           . losartan (COZAAR) tablet 50 mg  50 mg Oral Daily [name], MD   50 mg at 10/25/11 1024  . magnesium hydroxide (MILK OF MAGNESIA) suspension 30 mL  30 mL Oral Q12H PRN [name]      . memantine (NAMENDA) tablet 10 mg  10 mg Oral BID [name], MD   10 mg at 10/25/11 2131  . menthol-cetylpyridinium (CEPACOL) lozenge 3 mg  1 lozenge Oral PRN [name]       Or  . phenol (CHLORASEPTIC) mouth spray 1 spray  1 spray Mouth/Throat PRN [name]      . methocarbamol (ROBAXIN) tablet 500 mg  500 mg Oral Q6H PRN [name]   500 mg at 10/24/11 2305   Or  . methocarbamol (ROBAXIN) 500 mg in dextrose 5 % 50 mL IVPB  500  mg Intravenous Q6H PRN [name]      . metoCLOPramide (REGLAN) tablet 5-10 mg  5-10 mg Oral Q8H PRN [name]       Or  . metoCLOPramide (REGLAN) injection 5-10 mg  5-10 mg Intravenous Q8H PRN [name]      . metoprolol tartrate (LOPRESSOR) tablet 25 mg  25 mg Oral TID Gerrit Friends. Rothbart, MD   25 mg at 10/25/11 2131  . mupirocin ointment (BACTROBAN) 2 % 1 application  1 application Nasal BID [name], MD   1 application at 10/24/11 1331  . ondansetron (ZOFRAN) tablet 4 mg  4 mg Oral Q6H PRN [name]       Or  . ondansetron [facility]) injection 4 mg  4 mg Intravenous Q6H PRN [name]      . phytonadione (VITAMIN K) tablet 2.5 mg  2.5 mg Oral Once Dione Booze, MD   2.5 mg at 10/16/11 1631  . polyethylene glycol (MIRALAX / GLYCOLAX) packet 17 g  17 g Oral Daily PRN [name]      . senna (SENOKOT) tablet 17.2 mg  2 tablet Oral Daily PRN [name], MD      . sodium chloride 0.9 % bolus 250 mL  250 mL Intravenous Once [name], MD   250 mL at 10/18/11 1615  . sodium phosphate (FLEET) 7-19 GM/118ML enema 1 enema  1 enema Rectal Daily PRN [name]      . traMADol (ULTRAM) 50 MG tablet        50 mg at 10/25/11 2244  . traMADol (ULTRAM) 50 MG tablet        50 mg at 10/26/11 0625  . traMADol-acetaminophen (ULTRACET) 37.5-325 MG per tablet 1-2 tablet  1-2 tablet Oral Q6H PRN [name]   1 tablet at 10/23/11 1032  . Vitamin D (Ergocalciferol) (DRISDOL) capsule 50,000 Units  50,000 Units Oral Q7 days [name], MD   50,000 Units at 10/23/11 2052  . warfarin (COUMADIN) tablet 2.5 mg  2.5 mg Oral ONCE-1800 Mark A Perini,  MD   2.5 mg at 10/22/11 1804  . warfarin (COUMADIN) tablet 2.5 mg  2.5 mg Oral ONCE-1800 [name], PHARMD   2.5 mg at 10/24/11 1657  . warfarin (COUMADIN) tablet 2.5 mg  2.5 mg Oral ONCE-1800 Chales Abrahams [place], PHARMD   2.5 mg at 10/25/11 1746  . warfarin (COUMADIN) tablet 5 mg  5 mg Oral ONCE-1800 [name], PHARMD   5  mg at 10/18/11 1706  . warfarin (COUMADIN) tablet 5 mg  5 mg Oral ONCE-1800 [name], MD   5 mg at 10/19/11 1748  . warfarin (COUMADIN) tablet 5 mg  5 mg Oral ONCE-1800 [name], PHARMD   5 mg at 10/20/11 1702  . warfarin (COUMADIN) tablet 5 mg  5 mg Oral ONCE-1800 [name], PHARMD   5 mg at 10/21/11 1703  . warfarin (COUMADIN) tablet 5 mg  5 mg Oral ONCE-1800 Randall K Absher, PHARMD   5 mg at 10/23/11 1722  . DISCONTD: 0.9 %  sodium chloride infusion   Intravenous Continuous [name], MD      . DISCONTD: 0.9 %  sodium chloride infusion  250 mL Intravenous Continuous [name], MD      . DISCONTD: 0.9 %  sodium chloride infusion   Intravenous Continuous Ezequiel Kayser, MD 10 mL/hr at 10/19/11 1312    . DISCONTD: acetaminophen (TYLENOL) suppository 650 mg  650 mg Rectal Q6H PRN [name], MD      . DISCONTD: acetaminophen (TYLENOL) tablet 650 mg  650 mg Oral Q6H PRN [name], MD      . DISCONTD: amLODipine (NORVASC) tablet 5 mg  5 mg Oral Daily [name], MD      . DISCONTD: ciprofloxacin (CIPRO) tablet 250 mg  250 mg Oral BID [name], MD   250 mg at 10/20/11 2116  . DISCONTD: diltiazem (CARDIZEM) tablet 30 mg  30 mg Oral Q6H [name], MD   30 mg at 10/18/11 1244  . DISCONTD: docusate sodium (COLACE) capsule 100 mg  100 mg Oral BID [name], MD   100 mg at 10/20/11 1610  . DISCONTD: fentaNYL (SUBLIMAZE) injection 25-50 mcg  25-50 mcg Intravenous Q5 min PRN [name], MD      . DISCONTD: fentaNYL (SUBLIMAZE) injection 50 mcg  50 mcg Intravenous Q2H PRN Dione Booze, MD   50 mcg at 10/16/11 2253  . DISCONTD: heparin injection 5,000 Units  5,000 Units Subcutaneous Q8H [name], MD   5,000 Units at 10/17/11 2224  . DISCONTD: heparin injection 5,000 Units  5,000 Units Subcutaneous Q8H Ezequiel Kayser, MD   5,000 Units at 10/21/11 [phone]  . DISCONTD: HYDROcodone-acetaminophen (NORCO) 5-325 MG per tablet 1-2 tablet  1-2 tablet Oral Q4H PRN [name],  MD   1 tablet at 10/17/11 1403  . DISCONTD: lactated ringers infusion   Intravenous Continuous [name], MD      . DISCONTD: metoprolol (LOPRESSOR) tablet 50 mg  50 mg Oral BID [name], MD   50 mg at 10/17/11 2224  . DISCONTD: ondansetron (ZOFRAN) injection 4 mg  4 mg Intravenous Q6H PRN [name], MD      . DISCONTD: ondansetron [facility]) tablet 4 mg  4 mg Oral Q6H PRN [name], MD      . DISCONTD: phenylephrine (NEO-SYNEPHRINE) 10,000 mcg in sodium chloride 0.9 % 250 mL infusion  30-200 mcg/min Intravenous Titrated  [name], MD   5.333 mcg/min at 10/18/11 1215  . DISCONTD: promethazine (PHENERGAN) injection 6.25-12.5 mg  6.25-12.5 mg Intravenous Q15 min PRN [name], MD      . DISCONTD: sodium chloride 0.9 % 1,000 mL with potassium chloride 10 mEq infusion   Intravenous Continuous [name] 75 mL/hr at 10/19/11 0636    . DISCONTD: sodium chloride 0.9 % injection 3 mL  3 mL Intravenous Q12H [name], MD   3 mL at 10/17/11 2200  . DISCONTD: sodium chloride 0.9 % injection 3 mL  3 mL Intravenous PRN [name], MD      . DISCONTD: sodium chloride irrigation 0.9 %    PRN [name]   1,000 mL at 10/18/11 1047   No current outpatient prescriptions on file as of 10/26/2011.   Current Medications: Current facility-administered medications:0.9 %  sodium chloride infusion, , Intravenous, Continuous, Ezequiel Kayser, MD;  acetaminophen (TYLENOL) suppository 650 mg, 650 mg, Rectal, Q6H PRN, [name];  acetaminophen (TYLENOL) tablet 650 mg, 650 mg, Oral, Q6H PRN, [name], 650 mg at 10/20/11 1241;  bisacodyl (DULCOLAX) EC tablet 10 mg, 10 mg, Oral, Daily PRN, [name] bisacodyl (DULCOLAX) suppository 10 mg, 10 mg, Rectal, Daily PRN, [name];  docusate sodium (COLACE) capsule 100 mg, 100 mg, Oral, BID, [name], 100 mg at 10/25/11 2131;  donepezil (ARICEPT) tablet 23 mg, 23 mg, Oral, QHS, [name], MD, 23 mg at 10/25/11  2131;  escitalopram (LEXAPRO) tablet 5 mg, 5 mg, Oral, Daily, [name], MD, 5 mg at 10/25/11 1025 ferrous sulfate tablet 325 mg, 325 mg, Oral, TID PC, [name], 325 mg at 10/25/11 1746;  Flora-Q (FLORA-Q) Capsule 1 capsule, 1 capsule, Oral, Daily, Ezequiel Kayser, MD, 1 capsule at 10/25/11 1024;  levothyroxine (SYNTHROID, LEVOTHROID) tablet 75 mcg, 75 mcg, Oral, Daily, [name], MD, 75 mcg at 10/25/11 1024;  lip balm (CARMEX) ointment, , Topical, PRN, Dione Booze, MD losartan (COZAAR) tablet 50 mg, 50 mg, Oral, Daily, [name], MD, 50 mg at 10/25/11 1024;  magnesium hydroxide (MILK OF MAGNESIA) suspension 30 mL, 30 mL, Oral, Q12H PRN, [name];  memantine [facility]) tablet 10 mg, 10 mg, Oral, BID, [name], MD, 10 mg at 10/25/11 2131;  menthol-cetylpyridinium (CEPACOL) lozenge 3 mg, 1 lozenge, Oral, PRN, [name] methocarbamol (ROBAXIN) 500 mg in dextrose 5 % 50 mL IVPB, 500 mg, Intravenous, Q6H PRN, [name];  methocarbamol (ROBAXIN) tablet 500 mg, 500 mg, Oral, Q6H PRN, [name], 500 mg at 10/24/11 2305;  metoCLOPramide (REGLAN) injection 5-10 mg, 5-10 mg, Intravenous, Q8H PRN, [name];  metoCLOPramide (REGLAN) tablet 5-10 mg, 5-10 mg, Oral, Q8H PRN, [name] metoprolol tartrate (LOPRESSOR) tablet 25 mg, 25 mg, Oral, TID, Gerrit Friends. Dietrich Pates, MD, 25 mg at 10/25/11 2131;  ondansetron [facility]) injection 4 mg, 4 mg, Intravenous, Q6H PRN, [name];  ondansetron [facility]) tablet 4 mg, 4 mg, Oral, Q6H PRN, [name];  phenol (CHLORASEPTIC) mouth spray 1 spray, 1 spray, Mouth/Throat, PRN, [name];  polyethylene glycol (MIRALAX / GLYCOLAX) packet 17 g, 17 g, Oral, Daily PRN, [name] senna [facility]) tablet 17.2 mg, 2 tablet, Oral, Daily PRN, [name], MD;  sodium phosphate (FLEET) 7-19 GM/118ML enema 1 enema, 1 enema, Rectal, Daily PRN, [name];  traMADol (ULTRAM) 50 MG tablet, , , , ,  50 mg at 10/25/11 2244;  traMADol (ULTRAM)  50 MG tablet, , , , , 50 mg at 10/26/11 1610;  traMADol-acetaminophen (ULTRACET) 37.5-325 MG per tablet 1-2 tablet, 1-2 tablet, Oral, Q6H PRN, [name], 1 tablet at 10/23/11 1032 Vitamin D (Ergocalciferol) (DRISDOL) capsule 50,000 Units, 50,000 Units, Oral, Q7 days, [name], MD, 50,000 Units at 10/23/11 2052;  warfarin (COUMADIN) tablet 2.5 mg, 2.5 mg, Oral, ONCE-1800, Chales Abrahams Tiger, [place], 2.5 mg at 10/25/11 1746  Precautions/Special Needs:  Precautions/Special Needs Precautions/Special Needs: Hip/Back Hip/Back Precautions: Posterior total hip precautions Conditions/Impairments that will impact rehabilitation: Coordination;Pain;Cognition Cognition Conditions/Impairments: patient pleasantly confused at times, but follows commands Additional Precautions/Restrictions: Precautions Precautions: Posterior Hip Precaution Booklet Issued: Yes (comment) (THP handout hung in room) Precaution Comments: Pt pleasantly confused and requires repeat instruction with THP (new information) Required Braces or Orthoses: No Restrictions Weight Bearing Restrictions: Yes LUE Weight Bearing: Weight bearing as tolerated LLE Weight Bearing: Weight bearing as tolerated  Therapy Assessments Cognition Arousal/Alertness: Awake/alert Overall Cognitive Status: History of cognitive impairments History of Cognitive Impairment: Appears at baseline functioning Orientation Level: Oriented X4;Oriented to person;Oriented to place;Oriented to time Cognition - Other Comments: had some difficulty with memory for PLOF/home questions  Home Environment Home Living Lives With: Family;Son (and daughter-in-law) Receives Help From: Family Type of Home: House Home Layout: One level Home Access: Stairs to enter Entrance Stairs-Rails: Right Entrance Stairs-Number of Steps: 2-3 Bathroom Shower/Tub: Health visitor: Standard Bathroom Accessibility: Yes How Accessible: Accessible via walker Home  Adaptive Equipment: Shower chair with back     Prior Function: Prior Function Level of Independence: Independent with basic ADLs;Independent with gait;Independent with transfers;Needs assistance with homemaking Driving: No  ADLs/Mobility: ADL Grooming: Performed;Teeth care;Minimal assistance;Other (comment) (min guard) Where Assessed - Grooming: Standing at sink Upper Body Bathing: Simulated;Supervision/safety Where Assessed - Upper Body Bathing: Sitting, bed;Shower Lower Body Bathing: Simulated;Moderate assistance;Other (comment) (educated on/simulated with reacher) Lower Body Bathing Details (indicate cue type and reason): min cues for using reacher and for THPs Where Assessed - Lower Body Bathing: Sit to stand from chair Upper Body Dressing: Performed;Minimal assistance;Other (comment) (mod a for balance sitting eob; pt leans posteriorly) Where Assessed - Upper Body Dressing: Supported;Sitting, bed Lower Body Dressing: Simulated;Other (comment);Moderate assistance (pants only with reacher (simulated with pillowcase)) Lower Body Dressing Details (indicate cue type and reason): min cues for reacher and thps Where Assessed - Lower Body Dressing: Sit to stand from chair Toilet Transfer: Simulated;Minimal assistance;Other (comment) (after mod a for sit to stand; pt leans posteriorly.  recline) Toilet Transfer Details (indicate cue type and reason): min cues Toilet Transfer Method: Ambulating Toilet Transfer Equipment: Other (comment) (recliner) Toileting - Clothing Manipulation: Simulated;+2 Total assistance;Comment for patient % (25) Toileting - Clothing Manipulation Details (indicate cue type and reason): min cues Where Assessed - Toileting Clothing Manipulation: Sit to stand from 3-in-1 or toilet Toileting - Hygiene: Simulated;+2 Total assistance;Comment for patient % (0) Toileting - Hygiene Details (indicate cue type and reason): min Where Assessed - Toileting Hygiene:  Standing Equipment Used: Rolling walker ADL Comments: needs reinforcement with thps, but did well with new learning with reacher.  Current Functional Levels:   Bed Mobility Bed Mobility: Yes Supine to Sit: 4: Min assist;HOB elevated (Comment degrees) (HOB elevated 45') Supine to Sit Details (indicate cue type and reason): cues for technique Sitting - Scoot to Edge of Bed: 5: Supervision Sitting - Scoot to Edge of Bed Details (indicate cue type and reason): increased time and min  assist to support L LE to decrease pain Sit to Supine - Left: 3: Mod assist Sit to Supine - Left Details (indicate cue type and reason): VCs safety, technique. Assist for LE onto bed and trunk control/positoning. Transfers Transfers: Yes Sit to Stand: 3: Mod assist;From bed Sit to Stand Details (indicate cue type and reason): Mod assist 2 attempts as pt demon mod posterior LOB/lean with difficulty self correcting plus 75% VC's on proper tech Stand to Sit: 4: Min assist Stand to Sit Details: 75% VC's on hand placement to extend L LE prior to sit to avoid hip flex > 90' Stand Pivot Transfers: 4: Min Actuary Details (indicate cue type and reason): VCs safety. Assist to negotiate with RW.  Ambulation/Gait Ambulation/Gait: Yes Ambulation/Gait Assistance: 3: Mod assist Ambulation/Gait Assistance Details (indicate cue type and reason): increased assist this am 2nd posterior lean/LOB with poor self correction Ambulation Distance (Feet): 65 Feet Assistive device: Rolling walker Gait Pattern: Shuffle;Step-to pattern Stairs: No Wheelchair Mobility Wheelchair Mobility: No Balance Balance Assessed: Yes Static Sitting Balance Static Sitting - Level of Assistance: 3: Mod assist;2: Max assist;4: Min assist Static Sitting - Comment/# of Minutes: Initially max assist to sit, then progressed to mod with cues for anterior weight shift, tactile cue through abdominals and chin tuck to increase upright  positioning.  Needed approx 3-4 minutes with cueing and assist to progress to min assist and supporting self with both UE's Static Standing Balance Static Standing - Balance Support: Bilateral upper extremity supported Static Standing - Level of Assistance: 3: Mod assist (due to posterior lean)  Home Assistive Devices/Equipment:   Home Assistive Devices/Equipment Home Assistive Devices/Equipment: Eyeglasses  Discharge Planning:  Discharge Planning Living Arrangements: Children;Family members Support Systems: Family members;Children Do you have any problems obtaining your medications?: No Type of Residence: Private residence Home Care Services: Yes Genevieve Norlander) Type of Home Care Services: Home PT Home Care Agency (if known): [place] Patient expects to be discharged to::  (home) Expected Discharge Date:  (to be determined after short term rehab) Case Management Consult Needed: Yes (Comment)  Prior Functional Levels:   Independent with mobility, bathing and dressing prior to admission; pt did not drive and had assistance with meal prep/housework   Previous Home Environment:  Previous Home Environment Living Arrangements: Children;Family members Support Systems: Family members;Children Do you have any problems obtaining your medications?: No Type of Residence: Private residence Home Care Services: Yes Genevieve Norlander) Type of Home Care Services: Home PT Home Care Agency (if known): [place] Patient expects to be discharged to::  (home) Expected Discharge Date:  (to be determined after short term rehab)  Discharge Living Setting:  Discharge Living Setting Plans for Discharge Living Setting: Patient's home;Lives with (comment) (son and dtr in law) Discharge Living Setting Number of Levels: 1 Discharge Living Setting Number of Steps: 2-3 Discharge Living Setting is Bedroom on Main Floor?: Yes Discharge Living Setting is Bathroom on Main Floor?: Yes  Social/Family/Support Systems:   Social/Family/Support Systems Patient Roles: Parent Anticipated Caregiver: Pt's son Nedra Hai) Anticipated Caregiver's Contact Information: [phone] (cell-[phone]) Ability/Limitations of Caregiver: no limitations Caregiver Availability: 24/7 Discharge Plan Discussed with Primary Caregiver: Yes Is Caregiver In Agreement with Plan?: Yes Does Caregiver/Family have Issues with Lodging/Transportation while Pt is in Rehab?: No Goals/Additional Needs:  Goals/Additional Needs Patient/Family Goal for Rehab: "to get as close to where she was before" Cultural Considerations: none Dietary Needs: Heart Healthy Equipment Needs: to be determined Pt/Family Agrees to Admission and willing to participate: Yes Program Orientation  Provided & Reviewed with Pt/Caregiver Including Roles  & Responsibilities: Yes  Preadmission Screen Completed By:  Oletta Darter, 10/26/2011 9:39 AM  Patient's condition:  Please see physician update to information in consult dated 10/19/11 @1440 .  Preadmission Screen Competed by: Toni Amend Time/Date,0944/ 10/26/11.  Discussed status with Dr. Riley Kill on 11/26/12at 0800 10/26/11 (time/date) and received telephone approval for admission today.  Admission Coordinator:  Oletta Darter, time0946/Date11/26/12  .

## 2011-10-21 NOTE — Progress Notes (Signed)
ANTICOAGULATION CONSULT NOTE - Follow Up Consult  Pharmacy Consult for Coumadin Indication: Afib, VTE prophylaxis s/p bipolar hip arthroplasty  No Known Allergies  Patient Measurements: Height: 5\' 2"  (157.5 cm) Weight: 128 lb 6.4 oz (58.242 kg) (bed weight) IBW/kg (Calculated) : 50.1   Vital Signs: Temp: 98.3 F (36.8 C) (11/21 0623) Temp src: Oral (11/21 0623) BP: 110/76 mmHg (11/21 0924) Pulse Rate: 97  (11/21 0924)  Labs:  Basename 10/21/11 0442 10/20/11 0505 10/19/11 0315  HGB -- 11.9* 12.6  HCT -- 35.1* 38.3  PLT -- 122* 130*  APTT -- -- --  LABPROT 20.6* 19.0* 16.6*  INR 1.73* 1.56* 1.32  HEPARINUNFRC -- -- --  CREATININE -- 0.78 0.75  CKTOTAL -- -- --  CKMB -- -- --  TROPONINI -- -- --   Estimated Creatinine Clearance: 40.7 ml/min (by C-G formula based on Cr of 0.78).   Medications:  Scheduled:     . Chlorhexidine Gluconate Cloth  6 each Topical Q0600  . docusate sodium  100 mg Oral BID  . donepezil  23 mg Oral QHS  . escitalopram  5 mg Oral Daily  . ferrous sulfate  325 mg Oral TID PC  . Flora-Q  1 capsule Oral Daily  . levothyroxine  75 mcg Oral Daily  . losartan  50 mg Oral Daily  . memantine  10 mg Oral BID  . metoprolol  25 mg Oral TID  . mupirocin ointment  1 application Nasal BID  . Vitamin D (Ergocalciferol)  50,000 Units Oral Q7 days  . warfarin  5 mg Oral ONCE-1800  . DISCONTD: ciprofloxacin  250 mg Oral BID  . DISCONTD: docusate sodium  100 mg Oral BID  . DISCONTD: heparin subcutaneous  5,000 Units Subcutaneous Q8H    Assessment:  77 yo female on chronic anticoag with coumadin for afib, resumed 11/18 s/p hemiarthroplasty.    Home dose is 2.5mg  daily except 5mg  Mondays & Fridays.    Drug interaction with Cipro should result in an increase in INR - d/c'd today after 4 days of treatment.   No bleeding/complications reported.  INR increasing effectively and safely on 5mg  doses x3 days. Heparin SQ was d/c'd today.  Goal of Therapy:    INR 2-3   Plan:  Repeat coumadin 5mg  po once today. Possibly return to home dosing schedule tomorrow.  Follow up INR in AM.  [name], PharmD Pager: [phone] 10/21/2011 10:02 AM

## 2011-10-21 NOTE — Progress Notes (Signed)
Continue to wait on insurance decision regarding CIR. Expect to hear today as insurance is closed Thursday and Friday. So if no decision today, then will not be able to contact [facility] until Monday of next week unfortunately. Have bed availability today and will contact MD, Case manager as soon as decision made. Senaida Lange, RN [phone]

## 2011-10-21 NOTE — Progress Notes (Signed)
Have contacted [facility] all day in regards to approval for CIR today.[employer] has clinicals but has not yet made a determination if patient will be approved for CIR. If approval happens over next several hours we will plan on admitting patient Friday if this is acceptable for acute MD. Sorry for any delay due to insurance. Discussed this with patient's family who is aware of delay from insurance. Thanks. Toni Amend [phone]

## 2011-10-21 NOTE — Progress Notes (Signed)
Patient ID: Tasha Ball, female   DOB: 06-05-1924, 77 y.o.   MRN: [id number] Subjective: She has no complaints.  Two soft stools overnight.    Objective: Vital signs in last 24 hours: Temp:  [98.1 F (36.7 C)-98.3 F (36.8 C)] 98.3 F (36.8 C) (11/21 0623) Pulse Rate:  [86-99] 99  (11/21 0623) Resp:  [14-16] 16  (11/21 0623) BP: (104-135)/(72-85) 135/85 mmHg (11/21 0623) SpO2:  [91 %-93 %] 93 % (11/21 9147) Weight change:  Last BM Date: 10/20/11  Intake/Output from previous day: 11/20 0701 - 11/21 0700 In: -  Out: 100 [Urine:100] Intake/Output this shift:    General appearance: alert and cooperative Resp: clear to auscultation bilaterally Cardio: irreg, irreg 2/6 sem rsb, no rub or gallop GI: soft, non-tender; bowel sounds normal; no masses,  no organomegaly Extremities: extremities normal, atraumatic, no cyanosis or edema Neurologic: Grossly normal   Lab Results:  Basename 10/20/11 0505 10/19/11 0315  WBC 10.0 11.6*  HGB 11.9* 12.6  HCT 35.1* 38.3  PLT 122* 130*   BMET  Basename 10/20/11 0505 10/19/11 0315  NA 134* 136  K 3.8 3.7  CL 98 99  CO2 30 27  GLUCOSE 107* 117*  BUN 11 13  CREATININE 0.78 0.75  CALCIUM 8.3* 8.4   CMET CMP     Component Value Date/Time   NA 134* 10/20/2011 0505   K 3.8 10/20/2011 0505   CL 98 10/20/2011 0505   CO2 30 10/20/2011 0505   GLUCOSE 107* 10/20/2011 0505   BUN 11 10/20/2011 0505   CREATININE 0.78 10/20/2011 0505   CALCIUM 8.3* 10/20/2011 0505   PROT 6.5 11/03/2010 1132   ALBUMIN 4.2 11/03/2010 1132   AST 35 11/03/2010 1132   ALT 24 11/03/2010 1132   ALKPHOS 45 11/03/2010 1132   BILITOT 1.4* 11/03/2010 1132   GFRNONAA 74* 10/20/2011 0505   GFRAA 86* 10/20/2011 0505     Lab Results  Component Value Date   INR 1.73* 10/21/2011   INR 1.56* 10/20/2011   INR 1.32 10/19/2011   PROTIME 24.0* 01/21/2007    Studies/Results: No results found.  Medications: I have reviewed the patient's current  medications.  Assessment/Plan:  Principal Problem:  *Hip fracture, left  I will go ahead and do d/c and re-admit to rehab now while I have a chance. If it does not go through today it will have to be cancelled and re-done later.  She is overall doing okay. I will go ahead and stop the cipro given the couple soft stools she had and she has had 4-5 days of therapy already. Active Problems:  MRSA carrier - on protocol  HTN (hypertension)  Stable UTI  See above Atrial Fibrillation- on coumadin and rate controlled.   Inr is moving out and i think the subq heparin can be stopped now.   LOS: 5 days   Ezequiel Kayser, MD 10/21/2011, 7:04 AM

## 2011-10-21 NOTE — H&P (Signed)
Physical Medicine and Rehabilitation Admission H&P  Tasha Ball is an 77 y.o. female.   Chief Complaint  Patient presents with  . Fall  : RUE:AVWUJW S Tasha Ball is an 77 y.o. female with history of dementia, recent UTI, who tripped and fell on 11/16/2 and sustained acute left femoral neck fracture.Patient on chronic coumadin for chronic a-fib. Evaluated by [name] and patient underwent left hip Hemi on 10/18/11. Post op with elevated heart rate treated with IV Cardizem. [name] cards consulted and lopressor increased to q 8 hrs. Pt Is WBAT with posterior hip precautions.Placed on contact precautions for nasal positive MRSA. Requires total assist to ambulate 20 feet and multi cues due to history dementia.patient was seen by Physical Medicine and rehab with plan for CIR.   @ROS @  Review of Systems  Musculoskeletal: Positive for joint pain. Occasional insomnia. Mild constipation. All other systems reviewed and are negative   Past Medical History  Diagnosis Date  . Urinary tract infection   . Cancer     non-hodgkins lymphoma   . Lymphoma in remission     ca free 5 yrs  . Dementia   . Atrial fibrillation    Past Surgical History  Procedure Date  . Eye surgery     bil.cataract with lens implants  . Hip arthroplasty 10/18/2011    Procedure: ARTHROPLASTY BIPOLAR HIP;  Surgeon: [name];  Location: [facility];  Service: Orthopedics;  Laterality: Left;  depuy   History reviewed. No pertinent family history. Social History:  reports that she has quit smoking. She has never used smokeless tobacco. She reports that she does not drink alcohol or use illicit drugs. Allergies: No Known Allergies Medications Prior to Admission  Medication Dose Route Frequency Provider Last Rate Last Dose  . 0.9 %  sodium chloride infusion   Intravenous Continuous Ezequiel Kayser, MD      . acetaminophen (TYLENOL) tablet 650 mg  650 mg Oral Q6H PRN [name]   650 mg at 10/20/11 1241   Or  . acetaminophen  (TYLENOL) suppository 650 mg  650 mg Rectal Q6H PRN [name]      . bisacodyl (DULCOLAX) EC tablet 10 mg  10 mg Oral Daily PRN [name]       Or  . bisacodyl (DULCOLAX) suppository 10 mg  10 mg Rectal Daily PRN [name]      . ceFAZolin (ANCEF) IVPB 1 g/50 mL premix  1 g Intravenous Q6H [name]   1 g at 10/19/11 0520  . Chlorhexidine Gluconate Cloth 2 % PADS 6 each  6 each Topical Q0600 [name], MD   6 each at 10/21/11 [phone]  . docusate sodium (COLACE) capsule 100 mg  100 mg Oral BID [name]   100 mg at 10/20/11 2116  . donepezil (ARICEPT) tablet 23 mg  23 mg Oral QHS [name], MD   23 mg at 10/20/11 2115  . escitalopram (LEXAPRO) tablet 5 mg  5 mg Oral Daily [name], MD   5 mg at 10/20/11 0929  . fentaNYL (SUBLIMAZE) injection 25-50 mcg  25-50 mcg Intravenous Q5 min PRN [name], MD      . ferrous sulfate tablet 325 mg  325 mg Oral TID PC [name]   325 mg at 10/20/11 1702  . Flora-Q (FLORA-Q) Capsule 1 capsule  1 capsule Oral Daily Ezequiel Kayser, MD   1 capsule at  10/20/11 4098  . levothyroxine (SYNTHROID, LEVOTHROID) tablet 75 mcg  75 mcg Oral Daily [name], MD   75 mcg at 10/20/11 [phone]  . lip balm (CARMEX) ointment   Topical PRN Dione Booze, MD      . lip balm (CARMEX) ointment           . losartan (COZAAR) tablet 50 mg  50 mg Oral Daily [name], MD   50 mg at 10/20/11 4782  . magnesium hydroxide (MILK OF MAGNESIA) suspension 30 mL  30 mL Oral Q12H PRN [name]      . memantine (NAMENDA) tablet 10 mg  10 mg Oral BID [name], MD   10 mg at 10/20/11 2116  . menthol-cetylpyridinium (CEPACOL) lozenge 3 mg  1 lozenge Oral PRN [name]       Or  . phenol (CHLORASEPTIC) mouth spray 1 spray  1 spray Mouth/Throat PRN [name]      . methocarbamol (ROBAXIN) tablet 500 mg  500 mg Oral Q6H PRN [name]       Or  . methocarbamol (ROBAXIN) 500 mg in dextrose 5 % 50 mL IVPB  500 mg Intravenous Q6H PRN  [name]      . metoCLOPramide (REGLAN) tablet 5-10 mg  5-10 mg Oral Q8H PRN [name]       Or  . metoCLOPramide (REGLAN) injection 5-10 mg  5-10 mg Intravenous Q8H PRN [name]      . metoprolol tartrate (LOPRESSOR) tablet 25 mg  25 mg Oral TID Gerrit Friends. Rothbart, MD   25 mg at 10/20/11 2115  . mupirocin ointment (BACTROBAN) 2 % 1 application  1 application Nasal BID [name], MD   1 application at 10/20/11 2117  . ondansetron (ZOFRAN) tablet 4 mg  4 mg Oral Q6H PRN [name]       Or  . ondansetron [facility]) injection 4 mg  4 mg Intravenous Q6H PRN [name]      . phytonadione (VITAMIN K) tablet 2.5 mg  2.5 mg Oral Once Dione Booze, MD   2.5 mg at 10/16/11 1631  . polyethylene glycol (MIRALAX / GLYCOLAX) packet 17 g  17 g Oral Daily PRN [name]      . promethazine (PHENERGAN) injection 6.25-12.5 mg  6.25-12.5 mg Intravenous Q15 min PRN [name], MD      . [name]) tablet 17.2 mg  2 tablet Oral Daily PRN [name], MD      . sodium chloride 0.9 % bolus 250 mL  250 mL Intravenous Once [name], MD   250 mL at 10/18/11 1615  . sodium phosphate (FLEET) 7-19 GM/118ML enema 1 enema  1 enema Rectal Daily PRN [name]      . traMADol-acetaminophen (ULTRACET) 37.5-325 MG per tablet 1-2 tablet  1-2 tablet Oral Q6H PRN [name]      . Vitamin D (Ergocalciferol) (DRISDOL) capsule 50,000 Units  50,000 Units Oral Q7 days [name], MD   50,000 Units at 10/17/11 0001  . warfarin (COUMADIN) tablet 5 mg  5 mg Oral ONCE-1800 [name], PHARMD   5 mg at 10/18/11 1706  . warfarin (COUMADIN) tablet 5 mg  5 mg Oral ONCE-1800 [name], MD   5 mg at 10/19/11 1748  . warfarin (COUMADIN) tablet 5 mg  5 mg Oral ONCE-1800 [name],  PHARMD   5 mg at 10/20/11 1702  . DISCONTD: 0.9 %  sodium chloride infusion   Intravenous Continuous [name], MD      . DISCONTD: 0.9 %  sodium chloride infusion  250 mL Intravenous  Continuous [name], MD      . DISCONTD: 0.9 %  sodium chloride infusion   Intravenous Continuous Ezequiel Kayser, MD 10 mL/hr at 10/19/11 1312    . DISCONTD: acetaminophen (TYLENOL) suppository 650 mg  650 mg Rectal Q6H PRN [name], MD      . DISCONTD: acetaminophen (TYLENOL) tablet 650 mg  650 mg Oral Q6H PRN [name], MD      . DISCONTD: amLODipine (NORVASC) tablet 5 mg  5 mg Oral Daily [name], MD      . DISCONTD: ciprofloxacin (CIPRO) tablet 250 mg  250 mg Oral BID [name], MD   250 mg at 10/20/11 2116  . DISCONTD: diltiazem (CARDIZEM) tablet 30 mg  30 mg Oral Q6H [name], MD   30 mg at 10/18/11 1244  . DISCONTD: docusate sodium (COLACE) capsule 100 mg  100 mg Oral BID [name], MD   100 mg at 10/20/11 4098  . DISCONTD: fentaNYL (SUBLIMAZE) injection 50 mcg  50 mcg Intravenous Q2H PRN Dione Booze, MD   50 mcg at 10/16/11 2253  . DISCONTD: heparin injection 5,000 Units  5,000 Units Subcutaneous Q8H [name], MD   5,000 Units at 10/17/11 2224  . DISCONTD: heparin injection 5,000 Units  5,000 Units Subcutaneous Q8H Ezequiel Kayser, MD   5,000 Units at 10/21/11 [phone]  . DISCONTD: HYDROcodone-acetaminophen (NORCO) 5-325 MG per tablet 1-2 tablet  1-2 tablet Oral Q4H PRN [name], MD   1 tablet at 10/17/11 1403  . DISCONTD: lactated ringers infusion   Intravenous Continuous [name], MD      . DISCONTD: metoprolol (LOPRESSOR) tablet 50 mg  50 mg Oral BID [name], MD   50 mg at 10/17/11 2224  . DISCONTD: ondansetron (ZOFRAN) injection 4 mg  4 mg Intravenous Q6H PRN [name], MD      . DISCONTD: ondansetron [facility]) tablet 4 mg  4 mg Oral Q6H PRN [name], MD      . DISCONTD: phenylephrine (NEO-SYNEPHRINE) 10,000 mcg in sodium chloride 0.9 % 250 mL infusion  30-200 mcg/min Intravenous Titrated [name], MD   5.333 mcg/min at 10/18/11 1215  . DISCONTD: sodium chloride 0.9 % 1,000 mL with potassium chloride 10 mEq infusion   Intravenous  Continuous [name] 75 mL/hr at 10/19/11 0636    . DISCONTD: sodium chloride 0.9 % injection 3 mL  3 mL Intravenous Q12H [name], MD   3 mL at 10/17/11 2200  . DISCONTD: sodium chloride 0.9 % injection 3 mL  3 mL Intravenous PRN [name], MD      . DISCONTD: sodium chloride irrigation 0.9 %    PRN [name]   1,000 mL at 10/18/11 1047   No current outpatient prescriptions on file as of 10/21/2011.    Home: Home Living Lives With: Family;Son (and daughter-in-law) Receives Help From: Family Type of Home: House Home Layout: One level Home Access: Stairs to enter Entrance Stairs-Rails: Right Entrance Stairs-Number of Steps: 2-3 Bathroom Shower/Tub: Health visitor: Standard Home Adaptive Equipment: Shower chair with back   Functional History: Prior Function Level of Independence:  Independent with basic ADLs;Independent with gait;Independent with transfers;Needs assistance with homemaking Driving: No  Functional Status:  Mobility: Bed Mobility Bed Mobility: Yes Supine to Sit: 1: +2 Total assist Supine to Sit Details (indicate cue type and reason): total assist + 2 pt 25% with increased time and HOB increased 55" Sitting - Scoot to Edge of Bed: 1: +2 Total assist Sitting - Scoot to Edge of Bed Details (indicate cue type and reason): total assist + 2 pt 10% with great difficulty Transfers Transfers: Yes Sit to Stand: 1: +2 Total assist;From bed (from bed) Sit to Stand Details (indicate cue type and reason): Total assist + 2 pt 30% with 75% VC's on THP and hand placement Stand to Sit: 1: +2 Total assist;To chair/3-in-1 Stand to Sit Details: total assist + 2 pt 40% with 75% VC's to avoid hip flex > 90" and to extend L LE prior to sit Stand Pivot Transfers: 1: +2 Total assist Stand Pivot Transfer Details (indicate cue type and reason): Total assist + 2 pt 40% with 75% VC's for turn completion and safety Ambulation/Gait Ambulation/Gait:  Yes Ambulation/Gait Assistance: 1: +2 Total assist Ambulation/Gait Assistance Details (indicate cue type and reason): Total assist + 2 pt 50% with 75% VC's on proper sequencing and proper wealker to self distance Ambulation Distance (Feet): 20 Feet Assistive device: Rolling walker Gait Pattern: Step-to pattern;Decreased weight shift to left Stairs: No Wheelchair Mobility Wheelchair Mobility: No  ADL: ADL Grooming: Simulated;Set up Where Assessed - Grooming: Sitting, chair;Supported Upper Body Bathing: Simulated;Supervision/safety Where Assessed - Upper Body Bathing: Sitting, bed;Shower Lower Body Bathing: Simulated;+2 Total assistance;Comment for patient % (20% bathing and 40% sit to stand for all ADLs) Lower Body Bathing Details (indicate cue type and reason): mod cues for thps Where Assessed - Lower Body Bathing: Sit to stand from chair Upper Body Dressing: Performed;Minimal assistance;Other (comment) (mod a for balance sitting eob; pt leans posteriorly) Where Assessed - Upper Body Dressing: Supported;Sitting, bed Lower Body Dressing: Simulated;+2 Total assistance;Comment for patient % (0) Where Assessed - Lower Body Dressing: Sit to stand from chair Toilet Transfer: Simulated;+2 Total assistance;Comment for patient % (40) Toilet Transfer Details (indicate cue type and reason): min Toilet Transfer Method: Stand pivot (took about 3 steps) Acupuncturist: Other (comment) (recliner) Toileting - Clothing Manipulation: Simulated;+2 Total assistance;Comment for patient % (25) Toileting - Clothing Manipulation Details (indicate cue type and reason): min cues Where Assessed - Toileting Clothing Manipulation: Sit to stand from 3-in-1 or toilet Toileting - Hygiene: Simulated;+2 Total assistance;Comment for patient % (0) Toileting - Hygiene Details (indicate cue type and reason): min Where Assessed - Toileting Hygiene: Standing Equipment Used: Rolling walker ADL Comments: cotx  with PT.  Pt needs reinforcement with THPs.  Oriented to all but [facility]--said [facility].   discussed THPs and AE but did not use  Cognition: Cognition Arousal/Alertness: Awake/alert Orientation Level: Oriented to person;Oriented to place Cognition Arousal/Alertness: Awake/alert Overall Cognitive Status: History of cognitive impairments History of Cognitive Impairment: Appears at baseline functioning Orientation Level: Oriented to person;Oriented to place Cognition - Other Comments: had some difficulty with memory for PLOF/home questions   Blood pressure 135/85, pulse 99, temperature 98.3 F (36.8 C), temperature source Oral, resp. rate 16, height 5\' 2"  (1.575 m), weight 58.242 kg (128 lb 6.4 oz), SpO2 93.00%. Physical Exam  Constitutional: She is oriented to person, place, and time. She appears well-developed.  HENT:  Head: Normocephalic.  Eyes: Pupils are equal, round, and reactive to light.  Neck: Normal range  of motion.  Cardiovascular: Intact distal pulses. Heart is irregularly irregular Pulmonary/Chest: Effort normal.  Abdominal: Soft. Bowel sounds positive abdomen nontender  Neurological: She is oriented to person, place, and time.   Upper ext strength 4-5/5. Right LE 3+ prox to 4+/5 distally. Left LE 1/5 prox to 4/5 distally  Some short term memory deficits. HOH. Fairly good insight and awareness. Follows all simple commands .  Skin:  Left hip wound clean and intact without drainage. Left hip is appropriately tender   Results for orders placed during the hospital encounter of 10/16/11 (from the past 48 hour(s))  MRSA PCR SCREENING     Status: Abnormal   Collection Time   10/19/11  1:48 PM      Component Value Range Comment   MRSA by PCR POSITIVE (*) NEGATIVE    PROTIME-INR     Status: Abnormal   Collection Time   10/20/11  5:05 AM      Component Value Range Comment   Prothrombin Time 19.0 (*) 11.6 - 15.2 (seconds)    INR 1.56 (*) 0.00 - 1.49    CBC     Status: Abnormal    Collection Time   10/20/11  5:05 AM      Component Value Range Comment   WBC 10.0  4.0 - 10.5 (K/uL)    RBC 3.60 (*) 3.87 - 5.11 (MIL/uL)    Hemoglobin 11.9 (*) 12.0 - 15.0 (g/dL)    HCT 09.8 (*) 11.9 - 46.0 (%)    MCV 97.5  78.0 - 100.0 (fL)    MCH 33.1  26.0 - 34.0 (pg)    MCHC 33.9  30.0 - 36.0 (g/dL)    RDW 14.7  82.9 - 56.2 (%)    Platelets 122 (*) 150 - 400 (K/uL)   BASIC METABOLIC PANEL     Status: Abnormal   Collection Time   10/20/11  5:05 AM      Component Value Range Comment   Sodium 134 (*) 135 - 145 (mEq/L)    Potassium 3.8  3.5 - 5.1 (mEq/L)    Chloride 98  96 - 112 (mEq/L)    CO2 30  19 - 32 (mEq/L)    Glucose, Bld 107 (*) 70 - 99 (mg/dL)    BUN 11  6 - 23 (mg/dL)    Creatinine, Ser 1.30  0.50 - 1.10 (mg/dL)    Calcium 8.3 (*) 8.4 - 10.5 (mg/dL)    GFR calc non Af Amer 74 (*) >90 (mL/min)    GFR calc Af Amer 86 (*) >90 (mL/min)   PROTIME-INR     Status: Abnormal   Collection Time   10/21/11  4:42 AM      Component Value Range Comment   Prothrombin Time 20.6 (*) 11.6 - 15.2 (seconds)    INR 1.73 (*) 0.00 - 1.49     No results found.  Post Admission Physician Evaluation: 1. Functional deficits secondary  to left FNF with hip hemiarthroplasty. 2. Patient is admitted to receive collaborative, interdisciplinary care between the physiatrist, rehab nursing staff, and therapy team. 3. Patient's level of medical complexity and substantial therapy needs in context of that medical necessity cannot be provided at a lesser intensity of care such as a SNF. 4. Patient has experienced substantial functional loss from his/her baseline which was documented above under the "Functional History" and "Functional Status" headings.  Judging by the patient's diagnosis, physical exam, and functional history, the patient has potential for functional progress which will result  in measurable gains while on inpatient rehab.  These gains will be of substantial and practical use upon discharge   in facilitating mobility and self-care at the household level. 5. Physiatrist will provide 24 hour management of medical needs as well as oversight of the therapy plan/treatment and provide guidance as appropriate regarding the interaction of the two. 6. 24 hour rehab nursing will assist with bladder management, bowel management, safety, skin/wound care, pain management and patient education  and help integrate therapy concepts, techniques,education, etc. 7. PT will assess and treat for: balance, endurance, locomotion, strength and transferring. Goals are: supervision. 8. OT will assess and treat for: balance, bathing, dressing, feeding, grooming, locomotion and toileting .  Goals are: minimal assist.  9. SLP will assess and treat for: not app.  10. Case Management and Social Worker will assess and treat for psychological issues and discharge planning. 11. Team conference will be held weekly to assess progress toward goals and to determine barriers to discharge. 12.  Patient will receive at least 3 hours of therapy per day at least 5 days per week. 13. ELOS and Prognosis: 2 to 2.5 weeks good   Medical Problem List and Plan: 1.Left femoral neck fracture-s/p hip arthroplasty 11/18.WBAT/posterior hip precautions.PT/OT eval and treat.  Continue rigorous cueing given memory deficits. 1. DVT Prophylaxis/Anticoagulation: chronic coumadin for A FIB.Monitor platelets and any excessive bleeding.Follow up labs am.INR 1.73 with no bridiging of lovenox. 2. Pain Management: ultracet/robaxin.Monitor mental status. 3.MRSA-contact precautions. 4.Dementia-aricept/namenda.Monitor safety.May need bed alarm as the patient will likely exhibit more cognitive deficits while in the hospital and with the stress of this fracture and surgery. 5.HTN/HR.cozaar,lopressor.Monitor with increased activity and pain. heart rate currently controlled. 6.Mood-lexapro.ego support.  Mood appears fairly positive at present. 7.Anemia-iron  supplement.follow up labs 8.Hypothyrodism-synthroid      10/21/2011, 8:25 AM

## 2011-10-22 MED ORDER — WARFARIN SODIUM 2.5 MG PO TABS
2.5000 mg | ORAL_TABLET | Freq: Once | ORAL | Status: AC
  Administered 2011-10-22: 2.5 mg via ORAL
  Filled 2011-10-22 (×2): qty 1

## 2011-10-22 NOTE — Progress Notes (Signed)
Subjective: Patient did not move to rehab yesterday, planned tomorrow (presumed due to bed availability).  Was already transferred electronically and seen by Rehab PA per note.  Objective: Vital signs in last 24 hours: Temp:  [97.4 F (36.3 C)-98.4 F (36.9 C)] 97.4 F (36.3 C) (11/22 0500) Pulse Rate:  [79-119] 79  (11/22 0500) Resp:  [16-18] 16  (11/22 0500) BP: (96-132)/(63-84) 125/80 mmHg (11/22 0500) SpO2:  [95 %-96 %] 95 % (11/22 0500) Weight change:  Last BM Date: 10/22/11  Intake/Output from previous day: 11/21 0701 - 11/22 0700 In: 360 [P.O.:360] Out: 300 [Urine:300] Intake/Output this shift: Total I/O In: -  Out: 1 [Stool:1]  General appearance: alert Resp: clear to auscultation bilaterally Cardio: regular rate and rhythm, S1, S2 normal, no murmur, click, rub or gallop GI: soft, non-tender; bowel sounds normal; no masses,  no organomegaly  Lab Results:  [facility] 10/20/11 0505  WBC 10.0  HGB 11.9*  HCT 35.1*  PLT 122*   BMET  Basename 10/20/11 0505  NA 134*  K 3.8  CL 98  CO2 30  GLUCOSE 107*  BUN 11  CREATININE 0.78  CALCIUM 8.3*    Studies/Results: No results found.  Medications: I have reviewed the patient's current medications.  Assessment/Plan: Planned rehab s/p L hip fracture.  Medically stable.  Will look in on her until she is physically (not just electronically) transferred to Rehab.  LOS: 6 days   TISOVEC,RICHARD W 10/22/2011, 9:12 AM

## 2011-10-22 NOTE — Progress Notes (Signed)
Patient ID: [name], female   DOB: November 03, 1924, 77 y.o.   MRN: [id number] Subjective: 4 Days Post-Op Procedure(s) (LRB): ARTHROPLASTY BIPOLAR HIP (Left)    Patient reports pain as mild.  Objective:   VITALS:   Filed Vitals:   10/22/11 0500  BP: 125/80  Pulse: 79  Temp: 97.4 F (36.3 C)  Resp: 16    Neurovascular intact Incision: dressing C/D/I  LABS  Basename 10/20/11 0505  HGB 11.9*  HCT 35.1*  WBC 10.0  PLT 122*     Basename 10/20/11 0505  NA 134*  K 3.8  BUN 11  CREATININE 0.78  GLUCOSE 107*     Basename 10/22/11 0425 10/21/11 0442  LABPT -- --  INR 2.00* 1.73*     Assessment/Plan: 4 Days Post-Op Procedure(s) (LRB): ARTHROPLASTY BIPOLAR HIP (Left)   Up with therapy Plan is that she is to be transferred to CIR [facility], [facility], [phone] in 2 weeks for wound check and to assess progress.  Please call with questions, [phone].

## 2011-10-22 NOTE — Progress Notes (Signed)
ANTICOAGULATION CONSULT NOTE - Follow Up Consult  Pharmacy Consult for Coumadin Indication: Afib, VTE prophylaxis s/p bipolar hip arthroplasty  No Known Allergies  Patient Measurements: Height: 5\' 2"  (157.5 cm) Weight: 128 lb 6.4 oz (58.242 kg) (bed weight) IBW/kg (Calculated) : 50.1   Vital Signs: Temp: 97.4 F (36.3 C) (11/22 0500) Temp src: Oral (11/22 0500) BP: 125/80 mmHg (11/22 0500) Pulse Rate: 79  (11/22 0500)  Labs:  Basename 10/22/11 0425 10/21/11 0442 10/20/11 0505  HGB -- -- 11.9*  HCT -- -- 35.1*  PLT -- -- 122*  APTT -- -- --  LABPROT 23.0* 20.6* 19.0*  INR 2.00* 1.73* 1.56*  HEPARINUNFRC -- -- --  CREATININE -- -- 0.78  CKTOTAL -- -- --  CKMB -- -- --  TROPONINI -- -- --   Estimated Creatinine Clearance: 40.7 ml/min (by C-G formula based on Cr of 0.78).   Medications:  Scheduled:     . Chlorhexidine Gluconate Cloth  6 each Topical Q0600  . docusate sodium  100 mg Oral BID  . donepezil  23 mg Oral QHS  . escitalopram  5 mg Oral Daily  . ferrous sulfate  325 mg Oral TID PC  . Flora-Q  1 capsule Oral Daily  . levothyroxine  75 mcg Oral Daily  . losartan  50 mg Oral Daily  . memantine  10 mg Oral BID  . metoprolol  25 mg Oral TID  . mupirocin ointment  1 application Nasal BID  . Vitamin D (Ergocalciferol)  50,000 Units Oral Q7 days  . warfarin  5 mg Oral ONCE-1800    Assessment:  77 yo female on chronic anticoag with coumadin for afib, resumed 11/18 s/p hemiarthroplasty.    Home dose is 2.5mg  daily except 5mg  Mondays & Fridays.    No bleeding/complications reported.  INR increasing effectively and safely on 5mg  doses x4  Goal of Therapy:  INR 2-3   Plan:  Return to home dosing schedule today - 2.5mg .   Follow up INR in AM.  Rollene Fare, 10/22/2011, 11:15 AM

## 2011-10-23 MED ORDER — WARFARIN SODIUM 5 MG PO TABS
5.0000 mg | ORAL_TABLET | Freq: Once | ORAL | Status: AC
  Administered 2011-10-23: 5 mg via ORAL
  Filled 2011-10-23: qty 1

## 2011-10-23 NOTE — Progress Notes (Signed)
CSW signing off as Pt should go to CIR for rehab. If plans change, please reconsult CSW.  Tasha Ball 10/23/2011 12:28 PM

## 2011-10-23 NOTE — Progress Notes (Signed)
ANTICOAGULATION CONSULT NOTE - Follow Up Consult  Pharmacy Consult for Coumadin Indication: Afib, VTE prophylaxis s/p L hip hemiarthroplasty  No Known Allergies  Patient Measurements: Height: 5\' 2"  (157.5 cm) Weight: 125 lb 10.6 oz (57 kg) (bed scale) IBW/kg (Calculated) : 50.1    Vital Signs: Temp: 97.3 F (36.3 C) (11/23 0546) Temp src: Oral (11/23 0546) BP: 146/80 mmHg (11/23 0546) Pulse Rate: 81  (11/23 0546)  Labs:  Basename 10/23/11 0415 10/22/11 0425 10/21/11 0442  HGB -- -- --  HCT -- -- --  PLT -- -- --  APTT -- -- --  LABPROT 24.0* 23.0* 20.6*  INR 2.11* 2.00* 1.73*  HEPARINUNFRC -- -- --  CREATININE -- -- --  CKTOTAL -- -- --  CKMB -- -- --  TROPONINI -- -- --   Estimated Creatinine Clearance: 40.7 ml/min (by C-G formula based on Cr of 0.78).   Medications:  Scheduled:     . Chlorhexidine Gluconate Cloth  6 each Topical Q0600  . docusate sodium  100 mg Oral BID  . donepezil  23 mg Oral QHS  . escitalopram  5 mg Oral Daily  . ferrous sulfate  325 mg Oral TID PC  . Flora-Q  1 capsule Oral Daily  . levothyroxine  75 mcg Oral Daily  . losartan  50 mg Oral Daily  . memantine  10 mg Oral BID  . metoprolol  25 mg Oral TID  . mupirocin ointment  1 application Nasal BID  . Vitamin D (Ergocalciferol)  50,000 Units Oral Q7 days  . warfarin  2.5 mg Oral ONCE-1800   Patient's usual warfarin dosage is reportedly 2.5mg  daily except 5mg  Mondays and Fridays. Inpatient warfarin doses for past three days have been 5, 5, 2.5mg  PO.Marland Kitchen     Assessment: -Resuming chronic warfarin after hip fracture repair (L hip hemiarthroplasty) 11/18.  -INR therapeutic.  Goal of Therapy:  INR 2-3   Plan:  -Warfarin 5mg  PO today as per patient's usual regimen. -Follow PT/INR daily while inpatient.  Elie Goody, Pharm.D.  [phone] 10/23/2011 8:53 AM

## 2011-10-23 NOTE — Progress Notes (Signed)
Subjective: No complaints.  She just wants to get to rehab.  She wants a regular diet instead of heart healthy and we have ordered that.  Objective: Vital signs in last 24 hours: Temp:  [97.3 F (36.3 C)-98.3 F (36.8 C)] 97.3 F (36.3 C) (11/23 0546) Pulse Rate:  [72-81] 81  (11/23 0546) Resp:  [18] 18  (11/23 0546) BP: (118-146)/(70-80) 146/80 mmHg (11/23 0546) SpO2:  [94 %-95 %] 94 % (11/23 0546) Weight:  [57 kg (125 lb 10.6 oz)] 125 lb 10.6 oz (57 kg) (11/23 0546) Weight change:  Last BM Date: 10/22/11  Intake/Output from previous day: 11/22 0701 - 11/23 0700 In: 240 [P.O.:240] Out: 1 [Stool:1] Intake/Output this shift: Total I/O In: -  Out: 151 [Urine:150; Stool:1]  Sitting in [place] chair.  No acute distress. Conversing normally.  Cta bilat. No w/r/r Irreg/irreg 2/6 sem left and rsb, no rub or gallop No edema. Moe x4, grossly neuro intact.  Lab Results: No results found for this basename: WBC:2,HGB:2,HCT:2,PLT:2 in the last 72 hours BMET No results found for this basename: NA:2,K:2,CL:2,CO2:2,GLUCOSE:2,BUN:2,CREATININE:2,CALCIUM:2 in the last 72 hours Lab Results  Component Value Date   INR 2.11* 10/23/2011   INR 2.00* 10/22/2011   INR 1.73* 10/21/2011   PROTIME 24.0* 01/21/2007     Studies/Results: No results found.  Medications: I have reviewed the patient's current medications.  Assessment/Plan: Patient Active Problem List  Diagnoses  . Atrial fibrillation-stable.  . Urinary tract infection-completed treatment.  . Dementia-moderate. Follow.  . Hip fracture to go to rehab when insurance approval goes through.  Even with the delay in this approval she will still be best served by [employer].  She has supportive family at home and i have spoken to Cuthbert (son).  . Hip fracture, left-pt/ot  . 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 On protocol.  Marland Kitchen HTN (hypertension)-stable. Disp: to Cir hopefully Monday. Anticoagulation with coumadin. Per pharmacy. Therapeutic.     LOS: 7 days    Santanna Whitford A 10/23/2011, 11:13 AM

## 2011-10-23 NOTE — Progress Notes (Signed)
Continuing to follow.  Unable to get an answer from insurance carrier on Wednesday regarding inpatient rehab admission.  Insurance carrier closed until Monday.  Will follow up Monday with insurance case manager and let you know decision regarding possible inpatient rehab admission.  Pager [phone]

## 2011-10-23 NOTE — Progress Notes (Signed)
Physical Therapy Treatment Patient Details Name: Tasha Ball MRN: [id number] DOB: 01-05-1924 Today's Date: 10/23/2011 Time: 1340-1408   1G, 1TE PT Assessment/Plan  PT - Assessment/Plan Comments on Treatment Session: Pt continuing to progress well. Will need continued reinforcement of hip precautions. Continue to recommend CIR. PT Plan: Discharge plan remains appropriate Follow Up Recommendations: Inpatient Rehab Equipment Recommended: Defer to next venue PT Goals  Acute Rehab PT Goals PT Goal: Supine/Side to Sit - Progress: Progressing toward goal PT Goal: Sit at Edge Of Bed - Progress: Progressing toward goal PT Goal: Sit to Supine/Side - Progress: Progressing toward goal PT Transfer Goal: Bed to Chair/Chair to Bed - Progress: Progressing toward goal PT Goal: Ambulate - Progress: Partly met  PT Treatment Precautions/Restrictions  Precautions Precautions: Posterior Hip Precaution Booklet Issued: No Precaution Comments: initiated verbal instruction/education Required Braces or Orthoses: No Restrictions Weight Bearing Restrictions: Yes LLE Weight Bearing: Weight bearing as tolerated Mobility (including Balance) Bed Mobility Bed Mobility: Yes Sit to Supine - Left: 3: Mod assist Sit to Supine - Left Details (indicate cue type and reason): VCs safety, technique. Assist for LE onto bed and trunk control/positoning. Transfers Transfers: Yes Sit to Stand: 4: Min assist;From chair/3-in-1;With upper extremity assist;With armrests Sit to Stand Details (indicate cue type and reason): VCs/tactile cures for safety, hand placement, positioning of L LE before sitting and standing. Assist to rise and stabilize in initial standing. Stand to Sit: 4: Min assist;To bed;To chair/3-in-1;With upper extremity assist;With armrests Stand to Sit Details: VCs/tactile cues for safety, hand placement, positioning of L LE before sitting. Assist to control descent. Stand Pivot Transfers: 4: Min  assist Stand Pivot Transfer Details (indicate cue type and reason): VCs safety. Assist to negotiate with RW.  Ambulation/Gait Ambulation/Gait: Yes Ambulation/Gait Assistance: 4: Min assist Ambulation/Gait Assistance Details (indicate cue type and reason): VCs safety, technique, sequencing, maintaining appropriate base of support. Assist to stabilize and negotiate with RW. Several instances of instability/impaired balance. External assist required to prevent LOB/fall. Increased time needed for processing. Followed closely with recliner. Pt fatigues fairly easily. Ambulation Distance (Feet): 45 Feet Assistive device: Rolling walker Gait Pattern: Step-to pattern;Decreased stance time - left    Exercise  General Exercises - Lower Extremity Ankle Circles/Pumps: AROM;Both;10 reps;Supine Quad Sets: AROM;Strengthening;Both;10 reps;Supine Short Arc Quad: AROM;Strengthening;Left;10 reps;Supine Heel Slides: AAROM;Strengthening;Left;10 reps;Supine Hip ABduction/ADduction: AAROM;Strengthening;Left;10 reps;Supine End of Session PT - End of Session Equipment Utilized During Treatment: Gait belt Activity Tolerance: Patient tolerated treatment well Patient left: in bed;with call bell in reach General Behavior During Session: [facility] for tasks performed Cognition: [facility] for tasks performed  Rebeca [facility] 10/23/2011, 4:09 PM

## 2011-10-24 LAB — PROTIME-INR
INR: 2.3 — ABNORMAL HIGH (ref 0.00–1.49)
Prothrombin Time: 25.7 seconds — ABNORMAL HIGH (ref 11.6–15.2)

## 2011-10-24 MED ORDER — WARFARIN SODIUM 2.5 MG PO TABS
2.5000 mg | ORAL_TABLET | Freq: Once | ORAL | Status: AC
  Administered 2011-10-24: 2.5 mg via ORAL
  Filled 2011-10-24: qty 1

## 2011-10-24 NOTE — Progress Notes (Signed)
Physical Therapy Treatment Patient Details Name: Tasha Ball MRN: [id number] DOB: 1924/10/10 Today's Date: 10/24/2011 7:50 - 8:20 1 gt  1 ta PT Assessment/Plan  PT - Assessment/Plan Comments on Treatment Session: "My hip hurts alittle" Pt awaiting transfer to CIR PT Plan: Discharge plan remains appropriate PT Frequency: Min 5X/week Follow Up Recommendations: Inpatient Rehab Equipment Recommended: Defer to next venue PT Goals  Acute Rehab PT Goals PT Goal Formulation: With patient Pt will go Supine/Side to Sit: with min assist PT Goal: Supine/Side to Sit - Progress: Progressing toward goal Pt will Sit at [facility] of Bed: with supervision PT Goal: Sit at [facility] Of Bed - Progress: Progressing toward goal Pt will go Sit to Supine/Side: with min assist PT Goal: Sit to Supine/Side - Progress: Progressing toward goal Pt will Transfer Bed to Chair/Chair to Bed: with min assist PT Transfer Goal: Bed to Chair/Chair to Bed - Progress: Progressing toward goal Pt will Ambulate: 51 - 150 feet;with mod assist PT Goal: Ambulate - Progress: Progressing toward goal  PT Treatment Precautions/Restrictions  Precautions Precautions: Posterior Hip Precaution Booklet Issued: Yes (comment) (THP handout hung in room) Precaution Comments: Pt pleasantly confused and requires repeat instruction with THP (new information) Required Braces or Orthoses: No Restrictions Weight Bearing Restrictions: No LLE Weight Bearing: Weight bearing as tolerated Mobility (including Balance) Bed Mobility Bed Mobility: Yes Supine to Sit: 4: Min assist;HOB elevated (Comment degrees) (HOB elevated 45') Sitting - Scoot to Edge of Bed: 5: Supervision Sitting - Scoot to [place] of Bed Details (indicate cue type and reason): increased time and min assist to support L LE to decrease pain Transfers Transfers: Yes Sit to Stand: 3: Mod assist;From bed Sit to Stand Details (indicate cue type and reason): Mod assist 2 attempts as pt demon  mod posterior LOB/lean with difficulty self correcting plus 75% VC's on proper tech Stand to Sit: 4: Min assist Stand to Sit Details: 75% VC's on hand placement to extend L LE prior to sit to avoid hip flex > 90' Ambulation/Gait Ambulation/Gait: Yes Ambulation/Gait Assistance: 3: Mod assist Ambulation/Gait Assistance Details (indicate cue type and reason): increased assist this am 2nd posterior lean/LOB with poor self correction Ambulation Distance (Feet): 65 Feet Assistive device: Rolling walker Gait Pattern: Shuffle;Step-to pattern Stairs: No Wheelchair Mobility Wheelchair Mobility: No    Exercise    End of Session PT - End of Session Equipment Utilized During Treatment: Gait belt Activity Tolerance: Patient tolerated treatment well Patient left: in chair;with call bell in reach (chair alarm) Nurse Communication:  (NT communicated trans level amb amb distance) General Behavior During Session:  (pleasant and cooperative but required repeat VC's on new ins) Cognition:  (pleasant but confused with new information ex. THP) Felecia Shelling PTA [facility]  Acute  Rehab Pager     [phone]

## 2011-10-24 NOTE — Progress Notes (Signed)
Subjective: No complaints.  Participating in PT.  Anxious for transfer to CIR soon.  No pain.  BM normal.  Objective: Vital signs in last 24 hours: Temp:  [98.3 F (36.8 C)-98.4 F (36.9 C)] 98.4 F (36.9 C) (11/24 1322) Pulse Rate:  [73-84] 75  (11/24 1322) Resp:  [16-18] 16  (11/24 1322) BP: (124-152)/(79-95) 139/81 mmHg (11/24 1322) SpO2:  [93 %-94 %] 94 % (11/24 1322) Weight:  [55.4 kg (122 lb 2.2 oz)] 122 lb 2.2 oz (55.4 kg) (11/24 0500) Weight change: -1.6 kg (-3 lb 8.4 oz) Last BM Date: 10/23/11  CBG (last 3)  No results found for this basename: GLUCAP:3 in the last 72 hours  Intake/Output from previous day: 11/23 0701 - 11/24 0700 In: 120 [P.O.:120] Out: 826 [Urine:825; Stool:1] Intake/Output this shift: Total I/O In: 220 [P.O.:220] Out: 325 [Urine:325]  General appearance: alert and no distress Eyes: no scleral icterus Throat: oropharynx moist without erythema Resp: clear to auscultation bilaterally Cardio: irregularly irregular with grade 2/6 SEM GI: soft, non-tender; bowel sounds normal; no masses,  no organomegaly Extremities: no clubbing, cyanosis or edema   Lab Results: No results found for this basename: NA:2,K:2,CL:2,CO2:2,GLUCOSE:2,BUN:2,CREATININE:2,CALCIUM:2,MG:2,PHOS:2 in the last 72 hours No results found for this basename: AST:2,ALT:2,ALKPHOS:2,BILITOT:2,PROT:2,ALBUMIN:2 in the last 72 hours No results found for this basename: WBC:2,NEUTROABS:2,HGB:2,HCT:2,MCV:2,PLT:2 in the last 72 hours Lab Results  Component Value Date   INR 2.30* 10/24/2011   INR 2.11* 10/23/2011   INR 2.00* 10/22/2011   PROTIME 24.0* 01/21/2007   No results found for this basename: CKTOTAL:3,CKMB:3,CKMBINDEX:3,TROPONINI:3 in the last 72 hours No results found for this basename: TSH,T4TOTAL,FREET3,T3FREE,THYROIDAB in the last 72 hours No results found for this basename: VITAMINB12:2,FOLATE:2,FERRITIN:2,TIBC:2,IRON:2,RETICCTPCT:2 in the last 72 hours  Studies/Results: No  results found.   Medications: Scheduled:   . Chlorhexidine Gluconate Cloth  6 each Topical Q0600  . docusate sodium  100 mg Oral BID  . donepezil  23 mg Oral QHS  . escitalopram  5 mg Oral Daily  . ferrous sulfate  325 mg Oral TID PC  . Flora-Q  1 capsule Oral Daily  . levothyroxine  75 mcg Oral Daily  . losartan  50 mg Oral Daily  . memantine  10 mg Oral BID  . metoprolol  25 mg Oral TID  . mupirocin ointment  1 application Nasal BID  . Vitamin D (Ergocalciferol)  50,000 Units Oral Q7 days  . warfarin  2.5 mg Oral ONCE-1800  . warfarin  5 mg Oral ONCE-1800   Continuous:   . sodium chloride      Assessment/Plan: Principal Problem: 1. *Hip fracture, left- continue post-op management, daily PT while awaiting IR bed/insurance coverage. Active Problems: 2. Atrial fibrillation- continue rate control and anticoagulation.  Discontinue telemetry. 3. Urinary tract infection- resolved 4. Dementia- continue Aricept and Namenda. 5. HTN (hypertension)- well controlled. 6. Disposition- anticipate discharge to Inpatient Rehab on Monday pending insurance approval.    LOS: 8 days   Tyger Oka,W DOUGLAS 10/24/2011, 1:38 PM

## 2011-10-24 NOTE — Progress Notes (Signed)
ANTICOAGULATION CONSULT NOTE - Follow Up Consult  Pharmacy Consult for Coumadin Indication: Afib, VTE prophylaxis s/p L hip hemiarthroplasty  No Known Allergies  Patient Measurements: Height: 5\' 2"  (157.5 cm) Weight: 122 lb 2.2 oz (55.4 kg) (bed scale) IBW/kg (Calculated) : 50.1    Vital Signs: Temp: 98.3 F (36.8 C) (11/24 0520) Temp src: Oral (11/24 0520) BP: 152/95 mmHg (11/24 0520) Pulse Rate: 78  (11/24 0520)  Labs:  Basename 10/24/11 0500 10/23/11 0415 10/22/11 0425  HGB -- -- --  HCT -- -- --  PLT -- -- --  APTT -- -- --  LABPROT 25.7* 24.0* 23.0*  INR 2.30* 2.11* 2.00*  HEPARINUNFRC -- -- --  CREATININE -- -- --  CKTOTAL -- -- --  CKMB -- -- --  TROPONINI -- -- --   Estimated Creatinine Clearance: 40.7 ml/min (by C-G formula based on Cr of 0.78).   Medications:  Scheduled:     . Chlorhexidine Gluconate Cloth  6 each Topical Q0600  . docusate sodium  100 mg Oral BID  . donepezil  23 mg Oral QHS  . escitalopram  5 mg Oral Daily  . ferrous sulfate  325 mg Oral TID PC  . Flora-Q  1 capsule Oral Daily  . levothyroxine  75 mcg Oral Daily  . losartan  50 mg Oral Daily  . memantine  10 mg Oral BID  . metoprolol  25 mg Oral TID  . mupirocin ointment  1 application Nasal BID  . Vitamin D (Ergocalciferol)  50,000 Units Oral Q7 days  . warfarin  5 mg Oral ONCE-1800   Assessment:  Resuming chronic warfarin after hip fracture repair (L hip hemiarthroplasty) 11/18.   INR therapeutic.  Home dose: 2.5mg  daily except 5mg  Mondays and Fridays.  Goal of Therapy:  INR 2-3   Plan:  -Warfarin 2.5mg  PO today as per patient's usual regimen. -Follow PT/INR daily.  [name], PharmD Pager: [phone] 10/24/2011 10:53 AM

## 2011-10-25 LAB — PROTIME-INR
INR: 2.6 — ABNORMAL HIGH (ref 0.00–1.49)
Prothrombin Time: 28.3 seconds — ABNORMAL HIGH (ref 11.6–15.2)

## 2011-10-25 LAB — GLUCOSE, CAPILLARY: Glucose-Capillary: 97 mg/dL (ref 70–99)

## 2011-10-25 MED ORDER — WARFARIN SODIUM 2.5 MG PO TABS
2.5000 mg | ORAL_TABLET | Freq: Once | ORAL | Status: AC
  Administered 2011-10-25: 2.5 mg via ORAL
  Filled 2011-10-25: qty 1

## 2011-10-25 MED ORDER — TRAMADOL HCL 50 MG PO TABS
ORAL_TABLET | ORAL | Status: AC
  Administered 2011-10-25: 50 mg via OROMUCOSAL
  Filled 2011-10-25: qty 1

## 2011-10-25 NOTE — Progress Notes (Addendum)
ANTICOAGULATION CONSULT NOTE - Follow Up Consult  Pharmacy Consult for Coumadin Indication: Afib, VTE prophylaxis s/p L hip hemiarthroplasty  No Known Allergies  Patient Measurements: Height: 5\' 2"  (157.5 cm) Weight: 109 lb 14.4 oz (49.85 kg) (took everything off the bed. trapeze bar and ted machine) IBW/kg (Calculated) : 50.1    Vital Signs: Temp: 98.3 F (36.8 C) (11/25 0712) Temp src: Oral (11/25 0712) BP: 137/83 mmHg (11/25 0712) Pulse Rate: 72  (11/25 0712)  Labs:  Basename 10/25/11 0545 10/24/11 0500 10/23/11 0415  HGB -- -- --  HCT -- -- --  PLT -- -- --  APTT -- -- --  LABPROT 28.3* 25.7* 24.0*  INR 2.60* 2.30* 2.11*  HEPARINUNFRC -- -- --  CREATININE -- -- --  CKTOTAL -- -- --  CKMB -- -- --  TROPONINI -- -- --   Estimated Creatinine Clearance: 40.5 ml/min (by C-G formula based on Cr of 0.78).   Medications:  Scheduled:     . Chlorhexidine Gluconate Cloth  6 each Topical Q0600  . docusate sodium  100 mg Oral BID  . donepezil  23 mg Oral QHS  . escitalopram  5 mg Oral Daily  . ferrous sulfate  325 mg Oral TID PC  . Flora-Q  1 capsule Oral Daily  . levothyroxine  75 mcg Oral Daily  . losartan  50 mg Oral Daily  . memantine  10 mg Oral BID  . metoprolol  25 mg Oral TID  . mupirocin ointment  1 application Nasal BID  . Vitamin D (Ergocalciferol)  50,000 Units Oral Q7 days  . warfarin  2.5 mg Oral ONCE-1800   Assessment:  Resuming chronic warfarin after hip fracture repair (L hip hemiarthroplasty) 11/18.   INR therapeutic.  Home dose: 2.5mg  daily except 5mg  Mondays and Fridays.  Goal of Therapy:  INR 2-3   Plan:  -Warfarin 2.5mg  PO today as per patient's usual regimen. -Follow PT/INR daily  Tasha Ball 10/25/2011

## 2011-10-25 NOTE — Progress Notes (Signed)
Subjective: No complaints.  Pain controlled.  Sat in chair earlier today.  Anticipating rehab transfer tomorrow.  Objective: Vital signs in last 24 hours: Temp:  [98.3 F (36.8 C)-98.8 F (37.1 C)] 98.3 F (36.8 C) (11/25 1341) Pulse Rate:  [72-95] 95  (11/25 1341) Resp:  [18] 18  (11/25 1341) BP: (123-137)/(73-83) 123/81 mmHg (11/25 1341) SpO2:  [92 %-97 %] 97 % (11/25 1341) Weight:  [49.85 kg (109 lb 14.4 oz)] 109 lb 14.4 oz (49.85 kg) (11/25 9811) Weight change:  Last BM Date: 10/25/11  CBG (last 3)  No results found for this basename: GLUCAP:3 in the last 72 hours  Intake/Output from previous day: 11/24 0701 - 11/25 0700 In: 340 [P.O.:340] Out: 850 [Urine:850] Intake/Output this shift: Total I/O In: -  Out: 100 [Urine:100]  General appearance: alert and no distress Eyes: no scleral icterus Throat: oropharynx moist without erythema Resp: clear to auscultation bilaterally Cardio: irregularly irregular with grade 2/6 SEM GI: soft, non-tender; bowel sounds normal; no masses,  no organomegaly Extremities: no clubbing, cyanosis or edema   Lab Results: No results found for this basename: NA:2,K:2,CL:2,CO2:2,GLUCOSE:2,BUN:2,CREATININE:2,CALCIUM:2,MG:2,PHOS:2 in the last 72 hours No results found for this basename: AST:2,ALT:2,ALKPHOS:2,BILITOT:2,PROT:2,ALBUMIN:2 in the last 72 hours No results found for this basename: WBC:2,NEUTROABS:2,HGB:2,HCT:2,MCV:2,PLT:2 in the last 72 hours Lab Results  Component Value Date   INR 2.60* 10/25/2011   INR 2.30* 10/24/2011   INR 2.11* 10/23/2011   PROTIME 24.0* 01/21/2007   No results found for this basename: CKTOTAL:3,CKMB:3,CKMBINDEX:3,TROPONINI:3 in the last 72 hours No results found for this basename: TSH,T4TOTAL,FREET3,T3FREE,THYROIDAB in the last 72 hours No results found for this basename: VITAMINB12:2,FOLATE:2,FERRITIN:2,TIBC:2,IRON:2,RETICCTPCT:2 in the last 72 hours  Studies/Results: No results found.   Medications:  Scheduled:   . Chlorhexidine Gluconate Cloth  6 each Topical Q0600  . docusate sodium  100 mg Oral BID  . donepezil  23 mg Oral QHS  . escitalopram  5 mg Oral Daily  . ferrous sulfate  325 mg Oral TID PC  . Flora-Q  1 capsule Oral Daily  . levothyroxine  75 mcg Oral Daily  . losartan  50 mg Oral Daily  . memantine  10 mg Oral BID  . metoprolol  25 mg Oral TID  . Vitamin D (Ergocalciferol)  50,000 Units Oral Q7 days  . warfarin  2.5 mg Oral ONCE-1800  . warfarin  2.5 mg Oral ONCE-1800   Continuous:   . sodium chloride      Assessment/Plan: 1. *Hip fracture, left- continue post-op management, daily PT- anticipate transfer to CIR tomorrow. Active Problems:  2. Atrial fibrillation- continue rate control and anticoagulation.  Check CBC in am.  INR therapeutic. 3. Urinary tract infection- resolved  4. Dementia- continue Aricept and Namenda.  5. HTN (hypertension)- well controlled. Check BMET in am. 6. Disposition- anticipate discharge to Inpatient Rehab on Monday pending insurance approval.   LOS: 9 days   Taima Rada,W DOUGLAS 10/25/2011, 2:03 PM

## 2011-10-26 ENCOUNTER — Encounter (HOSPITAL_COMMUNITY): Payer: Self-pay | Admitting: *Deleted

## 2011-10-26 ENCOUNTER — Inpatient Hospital Stay (HOSPITAL_COMMUNITY)
Admission: RE | Admit: 2011-10-26 | Discharge: 2011-11-03 | DRG: 945 | Disposition: A | Discharge status: Home Health | Payer: Medicare Other | Source: Ambulatory Visit | Attending: Physical Medicine & Rehabilitation | Admitting: Physical Medicine & Rehabilitation

## 2011-10-26 DIAGNOSIS — C8589 Other specified types of non-Hodgkin lymphoma, extranodal and solid organ sites: Secondary | ICD-10-CM

## 2011-10-26 DIAGNOSIS — D72829 Elevated white blood cell count, unspecified: Secondary | ICD-10-CM

## 2011-10-26 DIAGNOSIS — I4891 Unspecified atrial fibrillation: Secondary | ICD-10-CM

## 2011-10-26 DIAGNOSIS — S72009A Fracture of unspecified part of neck of unspecified femur, initial encounter for closed fracture: Secondary | ICD-10-CM

## 2011-10-26 DIAGNOSIS — Z5189 Encounter for other specified aftercare: Secondary | ICD-10-CM

## 2011-10-26 DIAGNOSIS — W010XXA Fall on same level from slipping, tripping and stumbling without subsequent striking against object, initial encounter: Secondary | ICD-10-CM

## 2011-10-26 DIAGNOSIS — I10 Essential (primary) hypertension: Secondary | ICD-10-CM

## 2011-10-26 DIAGNOSIS — Z22322 Carrier or suspected carrier of Methicillin resistant Staphylococcus aureus: Secondary | ICD-10-CM

## 2011-10-26 DIAGNOSIS — S72002A Fracture of unspecified part of neck of left femur, initial encounter for closed fracture: Secondary | ICD-10-CM

## 2011-10-26 DIAGNOSIS — Z7901 Long term (current) use of anticoagulants: Secondary | ICD-10-CM

## 2011-10-26 DIAGNOSIS — S7290XA Unspecified fracture of unspecified femur, initial encounter for closed fracture: Secondary | ICD-10-CM

## 2011-10-26 DIAGNOSIS — D649 Anemia, unspecified: Secondary | ICD-10-CM

## 2011-10-26 DIAGNOSIS — Z96649 Presence of unspecified artificial hip joint: Secondary | ICD-10-CM

## 2011-10-26 DIAGNOSIS — Z79899 Other long term (current) drug therapy: Secondary | ICD-10-CM

## 2011-10-26 DIAGNOSIS — Z87440 Personal history of urinary (tract) infections: Secondary | ICD-10-CM

## 2011-10-26 DIAGNOSIS — E039 Hypothyroidism, unspecified: Secondary | ICD-10-CM

## 2011-10-26 DIAGNOSIS — K5900 Constipation, unspecified: Secondary | ICD-10-CM

## 2011-10-26 DIAGNOSIS — F0390 Unspecified dementia without behavioral disturbance: Secondary | ICD-10-CM

## 2011-10-26 HISTORY — DX: Essential (primary) hypertension: I10

## 2011-10-26 LAB — BASIC METABOLIC PANEL
BUN: 12 mg/dL (ref 6–23)
Calcium: 8.8 mg/dL (ref 8.4–10.5)
Creatinine, Ser: 0.74 mg/dL (ref 0.50–1.10)
GFR calc Af Amer: 87 mL/min — ABNORMAL LOW (ref >90)
GFR calc non Af Amer: 75 mL/min — ABNORMAL LOW (ref >90)

## 2011-10-26 LAB — CBC
HCT: 38.1 % (ref 36.0–46.0)
HCT: 39.3 % (ref 36.0–46.0)
Hemoglobin: 13.3 g/dL (ref 12.0–15.0)
MCHC: 33.1 g/dL (ref 30.0–36.0)
MCV: 97 fL (ref 78.0–100.0)
Platelets: 302 10*3/uL (ref 150–400)
Platelets: 350 10*3/uL (ref 150–400)
RBC: 4.05 MIL/uL (ref 3.87–5.11)
RDW: 13.2 % (ref 11.5–15.5)
WBC: 12.6 10*3/uL — ABNORMAL HIGH (ref 4.0–10.5)

## 2011-10-26 LAB — COMPREHENSIVE METABOLIC PANEL
AST: 28 U/L (ref 0–37)
CO2: 29 mEq/L (ref 19–32)
Chloride: 93 mEq/L — ABNORMAL LOW (ref 96–112)
Creatinine, Ser: 0.78 mg/dL (ref 0.50–1.10)
GFR calc Af Amer: 86 mL/min — ABNORMAL LOW (ref >90)
GFR calc non Af Amer: 74 mL/min — ABNORMAL LOW (ref >90)
Glucose, Bld: 196 mg/dL — ABNORMAL HIGH (ref 70–99)
Total Bilirubin: 1.3 mg/dL — ABNORMAL HIGH (ref 0.3–1.2)

## 2011-10-26 LAB — DIFFERENTIAL
Basophils Absolute: 0 10*3/uL (ref 0.0–0.1)
Basophils Relative: 0 % (ref 0–1)
Eosinophils Relative: 1 % (ref 0–5)
Monocytes Absolute: 1.1 10*3/uL — ABNORMAL HIGH (ref 0.1–1.0)
Neutro Abs: 9.7 10*3/uL — ABNORMAL HIGH (ref 1.7–7.7)

## 2011-10-26 LAB — PROTIME-INR
INR: 1.85 — ABNORMAL HIGH (ref 0.00–1.49)
Prothrombin Time: 21.7 seconds — ABNORMAL HIGH (ref 11.6–15.2)

## 2011-10-26 MED ORDER — PROMETHAZINE HCL 12.5 MG PO TABS: 12.5000 mg | ORAL_TABLET | Freq: Four times a day (QID) | ORAL | Status: DC | PRN

## 2011-10-26 MED ORDER — TRAMADOL HCL 50 MG PO TABS: 50.0000 mg | ORAL_TABLET | Freq: Four times a day (QID) | ORAL | Status: DC | PRN

## 2011-10-26 MED ORDER — ACETAMINOPHEN 325 MG PO TABS
325.0000 mg | ORAL_TABLET | ORAL | Status: DC | PRN
Start: 2011-10-26 — End: 2011-11-03
  Administered 2011-10-28 – 2011-11-02 (×4): 650 mg via ORAL
  Filled 2011-10-26 (×5): qty 2

## 2011-10-26 MED ORDER — VITAMIN D (ERGOCALCIFEROL) 1.25 MG (50000 UNIT) PO CAPS
50000.0000 [IU] | ORAL_CAPSULE | ORAL | Status: DC
  Filled 2011-10-26: qty 1

## 2011-10-26 MED ORDER — PROMETHAZINE HCL 25 MG/ML IJ SOLN: 12.5000 mg | Freq: Four times a day (QID) | INTRAMUSCULAR | Status: DC | PRN

## 2011-10-26 MED ORDER — PROMETHAZINE HCL 12.5 MG RE SUPP: 12.5000 mg | Freq: Four times a day (QID) | RECTAL | Status: DC | PRN

## 2011-10-26 MED ORDER — SENNOSIDES-DOCUSATE SODIUM 8.6-50 MG PO TABS: 2.0000 | ORAL_TABLET | Freq: Every day | ORAL | Status: DC

## 2011-10-26 MED ORDER — WARFARIN SODIUM 2.5 MG PO TABS: 2.5000 mg | ORAL_TABLET | Freq: Once | ORAL | Status: DC

## 2011-10-26 MED ORDER — TRAMADOL HCL 50 MG PO TABS
ORAL_TABLET | ORAL | Status: AC
  Administered 2011-10-26: 50 mg
  Filled 2011-10-26: qty 1

## 2011-10-26 MED ORDER — WARFARIN SODIUM 5 MG PO TABS
5.0000 mg | ORAL_TABLET | Freq: Once | ORAL | Status: AC
  Administered 2011-10-26: 5 mg via ORAL
  Filled 2011-10-26: qty 1

## 2011-10-26 MED ORDER — METOPROLOL TARTRATE 25 MG PO TABS
25.0000 mg | ORAL_TABLET | Freq: Three times a day (TID) | ORAL | Status: DC
  Administered 2011-10-26 – 2011-11-03 (×23): 25 mg via ORAL
  Filled 2011-10-26 (×27): qty 1

## 2011-10-26 MED ORDER — TRAMADOL-ACETAMINOPHEN 37.5-325 MG PO TABS: 1.0000 | ORAL_TABLET | Freq: Four times a day (QID) | ORAL | Status: DC | PRN

## 2011-10-26 MED ORDER — TRAMADOL HCL 50 MG PO TABS
50.0000 mg | ORAL_TABLET | Freq: Four times a day (QID) | ORAL | Status: DC | PRN
  Administered 2011-10-27 – 2011-11-01 (×5): 50 mg via ORAL
  Filled 2011-10-26 (×6): qty 1

## 2011-10-26 MED ORDER — ACETAMINOPHEN 650 MG RE SUPP: 650.0000 mg | Freq: Four times a day (QID) | RECTAL | Status: DC | PRN

## 2011-10-26 MED ORDER — ALUM & MAG HYDROXIDE-SIMETH 400-400-40 MG/5ML PO SUSP
30.0000 mL | ORAL | Status: DC | PRN
  Filled 2011-10-26: qty 30

## 2011-10-26 MED ORDER — SORBITOL 70 % SOLN: 30.0000 mL | Freq: Two times a day (BID) | Status: DC | PRN

## 2011-10-26 MED ORDER — TRAMADOL-ACETAMINOPHEN 37.5-325 MG PO TABS
1.0000 | ORAL_TABLET | Freq: Four times a day (QID) | ORAL | Status: DC | PRN
  Administered 2011-10-27 – 2011-10-29 (×4): 1 via ORAL
  Filled 2011-10-26 (×2): qty 2

## 2011-10-26 MED ORDER — ESCITALOPRAM OXALATE 5 MG PO TABS
5.0000 mg | ORAL_TABLET | Freq: Every day | ORAL | Status: DC
  Administered 2011-10-27 – 2011-11-03 (×8): 5 mg via ORAL
  Filled 2011-10-26 (×9): qty 1

## 2011-10-26 MED ORDER — GUAIFENESIN-DM 100-10 MG/5ML PO SYRP: 5.0000 mL | ORAL_SOLUTION | Freq: Four times a day (QID) | ORAL | Status: DC | PRN

## 2011-10-26 MED ORDER — FLORA-Q PO CAPS
1.0000 | ORAL_CAPSULE | Freq: Every day | ORAL | Status: DC
  Administered 2011-10-27 – 2011-11-03 (×8): 1 via ORAL
  Filled 2011-10-26 (×9): qty 1

## 2011-10-26 MED ORDER — FERROUS SULFATE 325 (65 FE) MG PO TABS
325.0000 mg | ORAL_TABLET | Freq: Three times a day (TID) | ORAL | Status: DC
  Administered 2011-10-26 – 2011-11-03 (×24): 325 mg via ORAL
  Filled 2011-10-26 (×27): qty 1

## 2011-10-26 MED ORDER — SENNOSIDES-DOCUSATE SODIUM 8.6-50 MG PO TABS
2.0000 | ORAL_TABLET | Freq: Every day | ORAL | Status: DC
  Administered 2011-10-26 – 2011-11-02 (×8): 2 via ORAL
  Filled 2011-10-26 (×8): qty 2

## 2011-10-26 MED ORDER — INFLUENZA VIRUS VACC SPLIT PF IM SUSP
0.5000 mL | INTRAMUSCULAR | Status: AC
  Administered 2011-10-27: 0.5 mL via INTRAMUSCULAR
  Filled 2011-10-26: qty 0.5

## 2011-10-26 MED ORDER — LEVOTHYROXINE SODIUM 75 MCG PO TABS
75.0000 ug | ORAL_TABLET | Freq: Every day | ORAL | Status: DC
  Administered 2011-10-27 – 2011-11-03 (×8): 75 ug via ORAL
  Filled 2011-10-26 (×9): qty 1

## 2011-10-26 MED ORDER — DONEPEZIL HCL 23 MG PO TABS
23.0000 mg | ORAL_TABLET | Freq: Every day | ORAL | Status: DC
  Administered 2011-10-26 – 2011-11-02 (×8): 23 mg via ORAL
  Filled 2011-10-26 (×10): qty 1

## 2011-10-26 MED ORDER — LOSARTAN POTASSIUM 50 MG PO TABS
50.0000 mg | ORAL_TABLET | Freq: Every day | ORAL | Status: DC
  Administered 2011-10-27 – 2011-11-03 (×8): 50 mg via ORAL
  Filled 2011-10-26 (×10): qty 1

## 2011-10-26 MED ORDER — WARFARIN SODIUM 5 MG PO TABS: 5.0000 mg | ORAL_TABLET | Freq: Once | ORAL | Status: DC

## 2011-10-26 MED ORDER — VITAMIN D (ERGOCALCIFEROL) 1.25 MG (50000 UNIT) PO CAPS
50000.0000 [IU] | ORAL_CAPSULE | ORAL | Status: DC
  Administered 2011-10-28: 50000 [IU] via ORAL
  Filled 2011-10-26: qty 1

## 2011-10-26 MED ORDER — MEMANTINE HCL 10 MG PO TABS
10.0000 mg | ORAL_TABLET | Freq: Two times a day (BID) | ORAL | Status: DC
  Administered 2011-10-26 – 2011-11-03 (×16): 10 mg via ORAL
  Filled 2011-10-26 (×19): qty 1

## 2011-10-26 NOTE — Progress Notes (Signed)
Occupational Therapy Treatment    Patient Details Name: Tasha Ball MRN: [id number] DOB: 19-Feb-1924 Today's Date: 10/26/2011 0956 1006 [place]  OT Assessment/Plan OT Assessment/Plan OT Plan: Discharge plan remains appropriate OT Frequency: Min 1X/week Follow Up Recommendations: Inpatient Rehab OT Goals Acute Rehab OT Goals OT Goal Formulation: With patient Time For Goal Achievement: 7 days ADL Goals ADL Goal: Grooming - Progress: Met Pt Will Perform Upper Body Bathing:  (goal not addressed) Pt Will Perform Lower Body Bathing:  (goal not addressed) ADL Goal: Lower Body Bathing - Progress: Not addressed ADL Goal: Upper Body Dressing - Progress: Not addressed ADL Goal: Lower Body Dressing - Progress: Not met ADL Goal: Toilet Transfer - Progress: Not addressed ADL Goal: Toileting - Hygiene - Progress: Not addressed Miscellaneous OT Goals OT Goal: Miscellaneous Goal #1 - Progress: Not met   Miscellaneous OT Goal #2: UPDATED Goal #1:  Pt will perform transfer to 3: commode over toilet and hygiene with min guard A and min cues OT Goal: Miscellaneous Goal #2 - Progress: Progressing toward goals Miscellaneous OT Goal #3: UPDATED Goal #2:  Pt will perform LB bathing with AE with min A and min cues using AE OT Goal: Miscellaneous Goal #3 - Progress: Progressing toward goals Miscellaneous OT Goal #4: UPDATED GOAL #3  Pt will don pants with reaching with Min A and min cues OT Goal: Miscellaneous Goal #4 - Progress: Progressing toward goals Miscellaneous OT Goal #5: Updated Goal #4  Pt will name 3/3 THPS with min cues to find handout hanging in room OT Goal: Miscellaneous Goal #5 - Progress: Progressing toward goals  OT Treatment Precautions/Restrictions  Precautions Precautions: Posterior Hip Restrictions Weight Bearing Restrictions: Yes LUE Weight Bearing: Weight bearing as tolerated LLE Weight Bearing: Weight bearing as tolerated   ADL ADL Lower Body Dressing: Performed;Maximal  assistance;Other (comment) (used mesh underwear with reacher for thps) Lower Body Dressing Details (indicate cue type and reason): mod cues; pt had difficulty with reacher today Where Assessed - Lower Body Dressing: Sit to stand from chair ADL Comments: Pt  recalled 1/3 thps.  Need mod cues to use reacher today with mesh underwear (more diffficult than normal underwear) Mobility  Transfers Sit to Stand: 4: Min assist;From chair/3-in-1 Exercises    End of Session OT - End of Session Equipment Utilized During Treatment:  (reacher and rw) Activity Tolerance: Patient tolerated treatment well Patient left: in chair;with call bell in reach General Behavior During Session: [facility] for tasks performed Cognition: Impaired (needs reinforcement for THPs and new learning (reacher) )  Cassady Turano [phone] 10/26/2011, 10:47 AM

## 2011-10-26 NOTE — Progress Notes (Signed)
Finally have insurance approval from [facility] on Inpatient Rehab for patient. Would like to bring to CIR today. Will update case manager and bedside RN. Marina Goodell [phone]

## 2011-10-26 NOTE — Progress Notes (Signed)
ANTICOAGULATION CONSULT NOTE - Follow Up Consult  Pharmacy Consult for Coumadin  Indication: history of Afib/VTE prophylaxis following Left hip fracture and repair  No Known Allergies  Patient Measurements: Height: 5\' 1"  (154.9 cm) Weight: 107 lb 3.2 oz (48.626 kg) IBW/kg (Calculated) : 47.8   Vital Signs: Temp: 97.6 F (36.4 C) (11/26 1201) Temp src: Oral (11/26 1201) BP: 135/84 mmHg (11/26 1201) Pulse Rate: 69  (11/26 1201)  Labs:  Basename 10/26/11 0519 10/25/11 0545 10/24/11 0500  HGB 12.6 -- --  HCT 38.1 -- --  PLT 302 -- --  APTT -- -- --  LABPROT 21.7* 28.3* 25.7*  INR 1.85* 2.60* 2.30*  HEPARINUNFRC -- -- --  CREATININE 0.74 -- --  CKTOTAL -- -- --  CKMB -- -- --  TROPONINI -- -- --   Estimated Creatinine Clearance: 38.8 ml/min (by C-G formula based on Cr of 0.74).   Medications:  Scheduled:    . donepezil  23 mg Oral QHS  . escitalopram  5 mg Oral Daily  . ferrous sulfate  325 mg Oral TID PC  . Flora-Q  1 capsule Oral Daily  . influenza  inactive virus vaccine  0.5 mL Intramuscular Tomorrow-1000  . levothyroxine  75 mcg Oral Daily  . losartan  50 mg Oral Daily  . memantine  10 mg Oral BID  . metoprolol  25 mg Oral TID  . senna-docusate  2 tablet Oral QHS  . Vitamin D (Ergocalciferol)  50,000 Units Oral Q7 days  . DISCONTD: senna-docusate  2 tablet Oral QHS  . DISCONTD: warfarin  2.5 mg Oral ONCE-1800  . DISCONTD: warfarin  5 mg Oral ONCE-1800    Assessment: 87 YOF on chronic warfarin therapy for afib, s/p fall and Left hip fracture and surgical repair.  INR has decreased overnight and is subtherapeutic at 1.8, no doses missed.  No bleeding noted and CBC stable  Goal of Therapy:  INR 2-3   Plan:  1. Coumadin 5mg  po tonight 2. Daily INR  Suzette Battiest, Tad Moore 10/26/2011,1:18 PM

## 2011-10-26 NOTE — Progress Notes (Signed)
Patient ID: Tasha Ball, female   DOB: July 25, 1924, 77 y.o.   MRN: [id number] Subjective: NO complaints. No pain really.  Objective: Vital signs in last 24 hours: Temp:  [97.9 F (36.6 C)-98.6 F (37 C)] 97.9 F (36.6 C) (11/26 0500) Pulse Rate:  [72-95] 73  (11/26 0500) Resp:  [16-18] 18  (11/26 0500) BP: (122-146)/(71-83) 146/73 mmHg (11/26 0500) SpO2:  [95 %-98 %] 95 % (11/26 0500) Weight:  [48.898 kg (107 lb 12.8 oz)-49.85 kg (109 lb 14.4 oz)] 107 lb 12.8 oz (48.898 kg) (11/26 0500) Weight change:  Last BM Date: 10/25/11  Intake/Output from previous day: 11/25 0701 - 11/26 0700 In: -  Out: 300 [Urine:300] Intake/Output this shift:    General appearance: alert, cooperative and appears stated age Resp: clear to auscultation bilaterally Cardio: irregularly irregular rhythm and S1, S2 normal GI: soft, non-tender; bowel sounds normal; no masses,  no organomegaly Extremities: extremities normal, atraumatic, no cyanosis or edema Neurologic: Grossly normal   Lab Results:  [facility] 10/26/11 0519  WBC 9.6  HGB 12.6  HCT 38.1  PLT 302   BMET  Basename 10/26/11 0519  NA 133*  K 4.0  CL 96  CO2 29  GLUCOSE 90  BUN 12  CREATININE 0.74  CALCIUM 8.8   CMET CMP     Component Value Date/Time   NA 133* 10/26/2011 0519   K 4.0 10/26/2011 0519   CL 96 10/26/2011 0519   CO2 29 10/26/2011 0519   GLUCOSE 90 10/26/2011 0519   BUN 12 10/26/2011 0519   CREATININE 0.74 10/26/2011 0519   CALCIUM 8.8 10/26/2011 0519   PROT 6.5 11/03/2010 1132   ALBUMIN 4.2 11/03/2010 1132   AST 35 11/03/2010 1132   ALT 24 11/03/2010 1132   ALKPHOS 45 11/03/2010 1132   BILITOT 1.4* 11/03/2010 1132   GFRNONAA 75* 10/26/2011 0519   GFRAA 87* 10/26/2011 0519    Lab Results  Component Value Date   INR 1.85* 10/26/2011   INR 2.60* 10/25/2011   INR 2.30* 10/24/2011   PROTIME 24.0* 01/21/2007     Studies/Results: No results found.  Medications: I have reviewed the patient's current  medications.  Assessment/Plan:  Principal Problem:  *Hip fracture, left- to go to rehab today. Active Problems:-coumadin adjust per pharmacy for afib.  Atrial fibrillation-see above  Urinary tract infection-resolved  Dementia-follow. On therapy.  MRSA carrier-per hospital protocol.  HTN (hypertension)-stable.   LOS: 10 days   Ezequiel Kayser, MD 10/26/2011, 6:58 AM

## 2011-10-26 NOTE — Discharge Summary (Signed)
Physician Discharge Summary  Patient ID: Tasha Ball MRN: [id number] DOB/AGE: [birth year] 75 y.o.  Admit date: 10/16/2011 Discharge date: 10/26/2011  Admission Diagnoses: Left hip fracture  Discharge Diagnoses:  Active Problems:   Patient Active Problem List  Diagnoses  . Atrial fibrillation  . Urinary tract infection  . Dementia  . Hip fracture  . Hip fracture, left  . MRSA carrier  . HTN (hypertension)     Discharged Condition: fair  Hospital Course: Tasha Ball is a pleasant 77 year old female with dementia.  She has a history of osteoporosis.  She was admitted with a left hip fracture.  She was on coumadin for atrial fibrillation.  When her INR had come down to a safe level she underwent bipolar arthroplasty with a good result.  Post-operatively she did have some tachycardia but this improved with medications.  She was treated with cipro for 5 days for a uti.  She progressed well post-operatively.  CIR was consulted and it was felt she would be a good candidate for inpatient rehab.  It took several days to get insurance approval but on 10/26/11 she was transferred in stable condition to the inpatient rehab service.  Consults: cardiology  Significant Diagnostic Studies: labs:  BMET    Component Value Date/Time   NA 131* 10/26/2011 1602   K 4.7 10/26/2011 1602   CL 93* 10/26/2011 1602   CO2 29 10/26/2011 1602   GLUCOSE 196* 10/26/2011 1602   BUN 13 10/26/2011 1602   CREATININE 0.78 10/26/2011 1602   CALCIUM 9.1 10/26/2011 1602   GFRNONAA 74* 10/26/2011 1602   GFRAA 86* 10/26/2011 1602    Lab Results  Component Value Date   WBC 12.6* 10/26/2011   HGB 13.3 10/26/2011   HCT 39.3 10/26/2011   MCV 97.0 10/26/2011   PLT 350 10/26/2011   Lab Results  Component Value Date   INR 1.85* 10/26/2011   INR 1.85* 10/26/2011   INR 2.60* 10/25/2011   PROTIME 24.0* 01/21/2007        Treatments: antibiotics: Cipro  Discharge Exam:  Blood pressure 128/87, pulse 69,  temperature 98 F (36.7 C), temperature source Oral, resp. rate 20, height 5\' 1"  (1.549 m), weight 48.626 kg (107 lb 3.2 oz), SpO2 96.00%.  Physical Exam:Blood pressure 128/87, pulse 69, temperature 98 F (36.7 C), temperature source Oral, resp. rate 20, height 5\' 1"  (1.549 m), weight 48.626 kg (107 lb 3.2 oz), SpO2 96.00%. She was semi-supine in bed in NAD.  She was alert and cooperative. Lungs were cta bilat. With no w/r/r.  Heart was irreg . Irreg. With no m/r/g.  She had a benign soft abdomen and no clubbing cyanosis or edema.  Disposition:discharge and readmit to CIR service   Current Discharge Medication List    CONTINUE these medications which have NOT CHANGED   Details  amLODipine (NORVASC) 5 MG tablet Take 5 mg by mouth daily.      donepezil (ARICEPT) 23 MG TABS tablet Take 23 mg by mouth at bedtime.      escitalopram (LEXAPRO) 10 MG tablet Take 5 mg by mouth daily.      levothyroxine (SYNTHROID, LEVOTHROID) 75 MCG tablet Take 75 mcg by mouth daily.      losartan (COZAAR) 100 MG tablet Take 50 mg by mouth daily.      memantine (NAMENDA) 10 MG tablet Take 10 mg by mouth 2 (two) times daily.      metoprolol (LOPRESSOR) 50 MG tablet Take 50 mg by mouth 2 (  two) times daily.      Vitamin D, Ergocalciferol, (DRISDOL) 50000 UNITS CAPS Take 50,000 Units by mouth every 7 (seven) days.      warfarin (COUMADIN) 5 MG tablet Take 2.5-5 mg by mouth daily. 0.5 tab daily except 1 tab on Mondays and Fridays         Signed: Sufyaan Palma A 10/26/2011, 7:06 PM

## 2011-10-26 NOTE — Progress Notes (Signed)
Overall Plan of Care [facility]) Patient Details Name: Tasha Ball MRN: [id number] DOB: Mar 31, 1924  Diagnosis:    Primary Diagnosis:   Left FNF s/p hip hemi <principal problem not specified> Co-morbidities: dementia, s/p fall at home, htn, a-fib  Functional Problem List  Patient demonstrates impairments in the following areas: Balance, Bladder, Bowel, Cognition, Endurance, Medication Management, Pain, Safety and Skin Integrity  Basic ADL's: grooming, bathing, dressing and toileting Advanced ADL's: simple meal preparation/simple home management task  Transfers:  bed mobility, bed to chair, toilet, tub/shower, car and furniture Locomotion:  ambulation, wheelchair mobility and stairs  Additional Impairments:  Other  Anticipated Outcomes Item Anticipated Outcome  Eating/Swallowing    Basic self-care  Supervision  Tolieting  Supervision   Bowel/Bladder  Patient will be continent of bowel and bladder with no accident  Transfers    Locomotion    Communication    Cognition    Pain  Patient's pain will be <2.  Safety/Judgment  Patient will remain free from injury this shift.  Other  Patient's surgical incision will be free from infection. Patient will have no skin breakdown this admission.   Therapy Plan:  PT: 1-2 x day       Team Interventions: Item RN PT OT SLP SW TR Other  Self Care/Advanced ADL Retraining   x      Neuromuscular Re-Education         Therapeutic Activities  x x      UE/LE Strength Training/ROM  x x      UE/LE Coordination Activities  x x      Visual/Perceptual Remediation/Compensation         DME/Adaptive Equipment Instruction  x x      Therapeutic Exercise  x x      Balance/Vestibular Training  x x      Patient/Family Education x x x      Cognitive Remediation/Compensation  x       Functional Mobility Training  x x      Ambulation/Gait Training  x x      Stair Training  x       Wheelchair Propulsion/Positioning  x x      Functional  Tourist information centre manager Reintegration  x x      Dysphagia/Aspiration Film/video editor         Bladder Management x        Bowel Management x        Disease Management/Prevention x        Pain Management x x x      Medication Management x        Skin Care/Wound Management x x       Splinting/Orthotics  x       Discharge Planning     x    Psychosocial Support     x                       Team Discharge Planning: Destination:  Home Projected Follow-up:  PT, Home Health and OT: TBD Projected Equipment Needs:  Bedside Commode and Walker Patient/family involved in discharge planning:  Yes; patient  MD ELOS: 7-10 days  Medical Rehab Prognosis:  Excellent Assessment: Patient has been admitted for CIR therapies.  PT will see patient at least 1-2 hours per day at least 5 days per week addressing fxnl mobility, hip precautions,  safety, adaptive equipment, balance with supervision goals.  OT will see at least 1-2 hours per day at least 5 days per week addressing ADL's, fxnl mobility, safety, upper extremity strength, hip precautions with supervision goals.  Close attention being paid to memory deficits and how they impact safety.

## 2011-10-27 DIAGNOSIS — I10 Essential (primary) hypertension: Secondary | ICD-10-CM

## 2011-10-27 DIAGNOSIS — S7290XA Unspecified fracture of unspecified femur, initial encounter for closed fracture: Secondary | ICD-10-CM

## 2011-10-27 DIAGNOSIS — W010XXA Fall on same level from slipping, tripping and stumbling without subsequent striking against object, initial encounter: Secondary | ICD-10-CM

## 2011-10-27 DIAGNOSIS — Z5189 Encounter for other specified aftercare: Secondary | ICD-10-CM

## 2011-10-27 DIAGNOSIS — S72009A Fracture of unspecified part of neck of unspecified femur, initial encounter for closed fracture: Secondary | ICD-10-CM

## 2011-10-27 HISTORY — DX: Unspecified fracture of unspecified femur, initial encounter for closed fracture: S72.90XA

## 2011-10-27 LAB — DIFFERENTIAL
Lymphocytes Relative: 15 % (ref 12–46)
Lymphs Abs: 1.6 10*3/uL (ref 0.7–4.0)
Neutrophils Relative %: 74 % (ref 43–77)

## 2011-10-27 LAB — CBC
HCT: 38.8 % (ref 36.0–46.0)
Hemoglobin: 12.8 g/dL (ref 12.0–15.0)
MCH: 32 pg (ref 26.0–34.0)
MCHC: 33 g/dL (ref 30.0–36.0)

## 2011-10-27 LAB — COMPREHENSIVE METABOLIC PANEL
BUN: 12 mg/dL (ref 6–23)
Calcium: 8.6 mg/dL (ref 8.4–10.5)
GFR calc Af Amer: 89 mL/min — ABNORMAL LOW (ref >90)
Glucose, Bld: 86 mg/dL (ref 70–99)
Total Protein: 6.1 g/dL (ref 6.0–8.3)

## 2011-10-27 LAB — URINALYSIS, ROUTINE W REFLEX MICROSCOPIC
Glucose, UA: NEGATIVE mg/dL
Hgb urine dipstick: NEGATIVE
Ketones, ur: NEGATIVE mg/dL
Protein, ur: NEGATIVE mg/dL

## 2011-10-27 LAB — PROTIME-INR: Prothrombin Time: 23.2 seconds — ABNORMAL HIGH (ref 11.6–15.2)

## 2011-10-27 MED ORDER — WARFARIN SODIUM 5 MG PO TABS
5.0000 mg | ORAL_TABLET | Freq: Once | ORAL | Status: AC
  Administered 2011-10-27: 5 mg via ORAL
  Filled 2011-10-27: qty 1

## 2011-10-27 NOTE — Progress Notes (Signed)
Patient information reviewed and entered into [facility] system by Moni Rothrock, RN, CRRN, PPS Coordinator.  Information including medical coding and functional independence measure will be reviewed and updated through discharge.     Per nursing patient was given "Data Collection Information Summary for Patients in Inpatient Rehabilitation Facilities with attached "Privacy Act Statement-Health Care Records" upon admission.   

## 2011-10-27 NOTE — Progress Notes (Signed)
Physical Therapy Assessment and Plan & Treatment Note  Patient Details  Name: Tasha Ball MRN: [id number] Date of Birth: 1923-12-20  PT Diagnosis: Abnormality of gait, Difficulty walking, Impaired cognition, Muscle weakness and Pain in joint Rehab Potential: Good ELOS: 7-10 days   Treatment Time: [phone] (Eval & Treatment) 60 min, individual therapy  Assessment & Plan Clinical Impression: Patient is a 77 y.o. year old female with recent admission to the hospital on 10/16/11 s/p fall requiring L hip hemi on 10/18/11.  Patient transferred to CIR on 10/26/2011 .  Patient's past medical history is significant for cancer (non-Hodgkin's Lymphoma, remission x 5 years) and baseline dementia.    Patient currently requires min with mobility secondary to muscle weakness, decreased cardiorespiratoy endurance, decreased problem solving, decreased safety awareness and decreased memory and decreased sitting balance, decreased standing balance, decreased balance strategies and difficulty maintaining precautions.  Prior to hospitalization, patient was independent with mobility and lived with Family;Son (& Daughter in Social worker) in a 1 story House .  Home access is 2-3Stairs to enter with one rail.  Patient will benefit from skilled PT intervention to maximize safe functional mobility, minimize fall risk and decrease caregiver burden for planned discharge home with 24 hour supervision.  Anticipate patient will benefit from follow up HH at discharge.  PT - End of Session Activity Tolerance: Tolerates 30+ min activity with multiple rests PT Assessment Rehab Potential: Good Barriers to Discharge: None PT Plan PT Frequency: 1-2 X/day, 60-90 minutes Estimated Length of Stay: 7-10 days PT Treatment/Interventions: Ambulation/gait training;Balance/vestibular training;Cognitive remediation/compensation;DME/adaptive equipment instruction;Functional mobility training;Pain management;Patient/family education;Stair  training;Community reintegration;Therapeutic Activities;Therapeutic Exercise;UE/LE Strength taining/ROM;UE/LE Coordination activities;Wheelchair propulsion/positioning;Visual/perceptual remediation/compensation  Precautions/Restrictions Precautions Precautions: Posterior Hip Required Braces or Orthoses: No Restrictions Weight Bearing Restrictions: Yes LUE Weight Bearing: Weight bearing as tolerated LLE Weight Bearing: Weight bearing as tolerated Pain Pain Assessment Pain Assessment: 0-10 Pain Score:   5 Pain Type: Surgical pain Pain Location: Hip Pain Orientation: Left Home Living/Prior Functioning Home Living Lives With: Family;Son (& Daughter in Social worker) Receives Help From: Family Type of Home: House Home Layout: One level Home Access: Stairs to enter Entrance Stairs-Rails: Right Entrance Stairs-Number of Steps: 2-3 Bathroom Shower/Tub: Health visitor: Standard Bathroom Accessibility: Yes How Accessible: Accessible via walker Home Adaptive Equipment: Shower chair with back Prior Function Level of Independence: Independent with basic ADLs;Needs assistance with homemaking;Independent with gait;Independent with transfers Driving: No Vision/Perception  Vision - History Baseline Vision: No visual deficits Patient Visual Report: No change from baseline Perception Perception: Within Functional Limits  Cognition Overall Cognitive Status: Impaired at baseline Arousal/Alertness: Awake/alert Orientation Level: Oriented X4 Memory: Impaired Memory Impairment: Decreased short term memory Awareness: Impaired Awareness Impairment: Anticipatory impairment Safety/Judgment: Impaired Comments: cueing needed for safety with mobility Sensation Sensation Light Touch: Appears Intact Stereognosis: Appears Intact Hot/Cold: Appears Intact Proprioception: Appears Intact Additional Comments: Appears Intact for UEs Coordination Gross Motor Movements are Fluid and  Coordinated: Yes Fine Motor Movements are Fluid and Coordinated: Yes Motor  Motor Motor: Within Functional Limits  Mobility Bed Mobility Bed Mobility: Yes Supine to Sit: 4: Min assist Sit to Supine - Left: 5: Supervision Transfers Transfers: Yes Sit to Stand: 4: Min assist Sit to Stand Details: Verbal cues for technique;Verbal cues for precautions/safety Stand to Sit: 4: Min assist Stand to Sit Details (indicate cue type and reason): Verbal cues for precautions/safety;Verbal cues for technique Stand Pivot Transfers: 4: Min assist Locomotion  Ambulation Ambulation: Yes Ambulation/Gait Assistance: 4: Min assist Ambulation Distance (Feet): 15 Feet  Assistive device: 1 person hand held assist Ambulation/Gait Assistance Details: Tactile cues for weight shifting;Verbal cues for precautions/safety Ambulation/Gait Assistance Details (indicate cue type and reason): with RW, minA but requires mod cueing for safety with device Gait Gait: Yes Gait Pattern: Impaired Gait Pattern: Decreased stance time - left;Decreased stride length;Right steppage;Left steppage (narrow BOS) High Level Ambulation High Level Ambulation: Direction changes;Backwards walking Backwards Walking: minA and cueing needed Direction Changes: cues for safety with AD during turns Stairs / Additional Locomotion Stairs: Yes Stairs Assistance: 4: Min assist Stairs Assistance Details: Verbal cues for precautions/safety Stair Management Technique: Two rails;Step to pattern Number of Stairs: 10  Height of Stairs: 5  Wheelchair Mobility Wheelchair Mobility: No  Trunk/Postural Assessment  Cervical Assessment Cervical Assessment: Exceptions to [facility] (forward head) Thoracic Assessment Thoracic Assessment:  (kyphotic poster, cueing needed) Lumbar Assessment Lumbar Assessment: Within Functional Limits Postural Control Postural Control: Within Functional Limits  Balance Balance Balance Assessed: Yes Standardized Balance  Assessment Standardized Balance Assessment: Berg Balance Test Berg Balance Test Sit to Stand: Needs minimal aid to stand or to stabilize Standing Unsupported: Unable to stand 30 seconds unassisted Sitting with Back Unsupported but Feet Supported on Floor or Stool: Able to sit 2 minutes under supervision Stand to Sit: Sits independently, has uncontrolled descent Transfers: Needs one person to assist Standing Unsupported with Eyes Closed: Needs help to keep from falling Standing Ubsupported with Feet Together: Needs help to attain position and unable to hold for 15 seconds From Standing, Reach Forward with Outstretched Arm: Loses balance while trying/requires external support From Standing Position, Pick up Object from Floor: Unable to try/needs assist to keep balance (NT due to hip precautions) From Standing Position, Turn to Look Behind Over each Shoulder: Needs assist to keep from losing balance and falling Turn 360 Degrees: Needs assistance while turning Standing Unsupported, Alternately Place Feet on Step/Stool: Needs assistance to keep from falling or unable to try (able to do 3 steps with minA) Standing Unsupported, One Foot in Front: Loses balance while stepping or standing Standing on One Leg: Unable to try or needs assist to prevent fall (able to hold 10 seconds but requires HHA) Total Score: 6  Static Sitting Balance Static Sitting - Level of Assistance: 5: Stand by assistance Static Sitting - Comment/# of Minutes: uses bilateral UEs to prop up but able to sit with S without UE support Static Standing Balance Static Standing - Balance Support: Bilateral upper extremity supported Static Standing - Level of Assistance: 4: Min assist Dynamic Standing Balance Dynamic Standing - Level of Assistance: 3: Mod assist (without UE support, posterior lean requires cueing weightshi) Dynamic Standing - Balance Activities: Reaching across midline;Lateral lean/weight shifting;Other (comment)  (folding towels at table top) Extremity Assessment  RUE Assessment RUE Assessment: Within Functional Limits LUE Assessment LUE Assessment: Within Functional Limits RLE Assessment RLE Assessment: Within Functional Limits LLE Assessment LLE Assessment: Exceptions to WFL LLE AROM (degrees) Overall AROM Left Lower Extremity: Due to precautions;Due to decreased strength LLE Overall AROM Comments: WFL within hip precaution range  Recommendations for other services: Other: none  Discharge Criteria: Patient will be discharged from PT if patient refuses treatment 3 consecutive times without medical reason, if treatment goals not met, if there is a change in medical status, if patient makes no progress towards goals or if patient is discharged from hospital.  The above assessment, treatment plan, treatment alternatives and goals were discussed and mutually agreed upon: by patient  Treatment initiated this session with focus on gait  training with RW, stair training, and dynamic balance for functional tasks. Pt requires mod cueing for safety with AD and overall safety with mobility. Patient demonstrates increased fall risk as noted by score of  6 /56 on Berg Balance Scale.  (<36= high risk for falls, close to 100%; 37-45 significant >80%; 46-51 moderate >50%; 52-55 lower >25%). Discussed results with pt and reason for use of RW at this time due to poor balance. Pt able to verbalize that she can feel herself falling backwards when LOB occurs. Needs manual facilitation to correct.   Karolee Stamps [facility] 10/27/2011, 10:26 AM

## 2011-10-27 NOTE — Progress Notes (Signed)
Social Work Assessment and Plan Assessment and Plan  Patient Name: Tasha Ball  UXLKG'M Date: 10/27/2011  Problem List:  Patient Active Problem List  Diagnoses  . Atrial fibrillation  . Urinary tract infection  . Dementia  . Hip fracture  . Hip fracture, left  . MRSA carrier  . HTN (hypertension)  . Femoral fracture    Past Medical History:  Past Medical History  Diagnosis Date  . Urinary tract infection   . Cancer     non-hodgkins lymphoma   . Lymphoma in remission     ca free 5 yrs  . Dementia   . Atrial fibrillation   . Hypertension     Past Surgical History:  Past Surgical History  Procedure Date  . Eye surgery     bil.cataract with lens implants  . Hip arthroplasty 10/18/2011    Procedure: ARTHROPLASTY BIPOLAR HIP;  Surgeon: [name];  Location: [facility];  Service: Orthopedics;  Laterality: Left;  depuy    Discharge Planning  Discharge Planning Living Arrangements: Children Support Systems: Children;Church/faith community;Friends/neighbors;Home care staff Assistance Needed: May require 24 hour supervision at discharge Do you have any problems obtaining your medications?: No Type of Residence: Private residence Home Care Services: Yes Genevieve Norlander) Type of Home Care Services: Home PT;Sitter Home Care Agency (if known): [place] Patient expects to be discharged to:: Home with assistance from family and hired sitter Case Management Consult Needed: Yes (Comment) (RNCM already involved)  Social/Family/Support Systems Social/Family/Support Systems Patient Roles: Parent Contact Information: Elie Goody [phone], [phone] Anticipated Caregiver: Son and dtr in-law along with hired Teacher, music Information: See above Ability/Limitations of Caregiver: None Caregiver Availability: 24/7  Employment Status Employment Status Employment Status: Retired Date Retired/Disabled/Unemployed: No Recruitment consultant Issues:  No issues Guardian/Conservator: none  Abuse/Neglect Abuse/Neglect Assessment (Assessment to be complete while patient is alone) Physical Abuse: Denies Verbal Abuse: Denies Sexual Abuse: Denies Exploitation of patient/patient's resources: Denies Self-Neglect: Denies  Emotional Status Emotional Status Pt's affect, behavior adn adjustment status: Pt still can't beleive she fell and fractured her hip.  Son has a good understanding of mom's hip fracture and limitations Recent Psychosocial Issues: Other health issues-dementia Pyschiatric History: No history- deferred BDi due to not necessary coping appropriately with her hip fracture  Patient/Family Perceptions, Expectations & Goals Pt/Family Perceptions, Expectations and Goals Pt/Family understanding of illness & functional limitations: Both son and pt have a good understanding of her fracture and limitations. Both hopeful she will do well here Premorbid pt/family roles/activities: Mom, Grandmother, Customer service manager, Home owner, etc Anticipated changes in roles/activities/participation: Plans to resume at discharge Pt/family expectations/goals: Pt states:" I want to get mobile and moving around again". Son states: " We want her to do well and atleast walk with a walker at discharge."  [employer] Agencies: None Premorbid Home Care/DME Agencies: Other (Comment) Genevieve Norlander to follow at discharge) Transportation available at discharge: Family to provide Resource referrals recommended: Support group (specify) (Caregiver Support Group)  Discharge Visual merchandiser Resources: Media planner (specify) Theatre manager Medicare) Financial Resources: Social Security;Family Support Financial Screen Referred: No Living Expenses: Lives with family Money Management: Family Home Management: Family and hired Biomedical engineer Plans: Return home with family or hired Comptroller with her. Will  have 24 hour supervision at discharge.  Clinical Impression:  Pleasant woman who has just gotten done with am therapies.  She is upbeat and ready to be walking again.  Family to provide 24 hour supervision at discharge.  Lucy Chris 10/27/2011

## 2011-10-27 NOTE — Progress Notes (Signed)
[facility]patient Rehabilitation [facility] Individual Statement of Services  Patient Name:  Tasha Ball  Date:  10/27/2011  Welcome to the Inpatient Rehabilitation Center.  Our goal is to provide you with an individualized program based on your diagnosis and situation, designed to meet your specific needs.  With this comprehensive rehabilitation program, you will be expected to participate in at least 3 hours of rehabilitation therapies Monday-Friday, with modified therapy programming on the weekends.  Your rehabilitation program will include the following services:  Physical Therapy (PT), Occupational Therapy (OT), 24 hour per day rehabilitation nursing, Therapeutic Recreaction (TR), Case Management (RN and Child psychotherapist), Rehabilitation Medicine, Nutrition Services and Pharmacy Services  Weekly team conferences will be held on Tuesdays to discuss your progress.  Your RN Case Designer, television/film set will talk with you frequently to get your input and to update you on team discussions.  Team conferences with you and your family in attendance may also be held.  Estimated length of stay: 7-10 days  Overall predicted outcome: Supervision Depending on your progress and recovery, your program may change.  Your RN Case Estate agent will coordinate services and will keep you informed of any changes.  Your RN Sports coach and SW names and contact numbers are listed  below.  The following services may also be recommended but are not provided by the Inpatient Rehabilitation Center:   Driving Evaluations  Home Health Rehabiltiation Services  Outpatient Rehabilitatation [facility]  Vocational Rehabilitation   Arrangements will be made to provide these services after discharge if needed.  Arrangements include referral to agencies that provide these services.  Your insurance has been verified to be: [employer] Your primary doctor is:  Dr. Rodrigo Ran  Pertinent information will be shared with  your doctor and your insurance company.  Case Manager: Lutricia Horsfall, [facility] [phone]  Social Worker:  Dossie Der, [place] [phone]  Information discussed with pt and son and copy given to patient by: Meryl Dare, 10/27/2011 3:30 PM

## 2011-10-27 NOTE — Progress Notes (Signed)
Physical Therapy Note  Patient Details  Name: Tasha Ball MRN: [id number] Date of Birth: 1924-02-20 Today's Date: 10/27/2011  Individual therapy, 1430-1520. No c/o pain. Introduced [place] HEP to pt for lower extremity strengthening and balance/fall prevention, required overall minA to maintain balance with 1 LOB posteriorly with uncontrolled descent into chair during single limb balance exercise. Completed 10 reps of each extremity for hip abduction, hamstring curls, heel and toe raises, and mini squats. Functional strengthening and mobility retraining for sit to stands without AD for support, required steady A for balance after repetition, cueing needed to push up with arms and reach back with hand for eccentric control. Gait training with RW with focus on safety with RW, steppage gait noted and posterior lean initially, minA overall needed, improved quality of gait as distance progressed. Cueing needed to stay close to walker during turns and when avoiding obstacles. MinA transfer back to bed and supervision for sit -> supine and positioning in the bed.   Karolee Stamps [facility] 10/27/2011, 3:32 PM

## 2011-10-27 NOTE — Patient Care Conference (Signed)
Inpatient RehabilitationTeam Conference Note Date: 10/27/2011   Time: 2:00 PM    Patient Name: Tasha Ball      Medical Record Number: [id number]  Date of Birth: 1924/02/25 Sex: Female         Room/Bed: 4008/4008-01 Payor Info: Payor: [employer]UE [employer]UE [employer] [place] MEDICARE  Plan: BLUE MEDICARE  Product Type: *No Product type*     Admitting Diagnosis: lt hip fx  Admit Date/Time:  10/26/2011 11:24 AM Admission Comments: No comment available   Primary Diagnosis:  Femoral fracture Principal Problem: Femoral fracture  Patient Active Problem List  Diagnoses Date Noted  . Femoral fracture 10/27/2011  . 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 HTN (hypertension) 10/20/2011  . Hip fracture 10/16/2011  . Hip fracture, left 10/16/2011  . Atrial fibrillation   . Urinary tract infection   . Dementia     Expected Discharge Date: Expected Discharge Date: 11/03/11  Team Members Present: Physician: Dr. Faith Rogue Case Manager Present: Lutricia Horsfall, RN Social Worker Present: Dossie Der, LCSW PT Present: Karolee Stamps, PT OT Present: Edwin Cap, Loistine Chance, OT Other (Discipline and Name): Tora Duck, RN, PPS Coord; Rosebud Poles, RN     Current Status/Progress Goal Weekly Team Focus  Medical   Left hip fracture with history of dementia  Pain management while maintaining appropriate cognitive levels  See above   Bowel/Bladder   patient is continent of bowel.  patient will need minimal assistance, and remain continetn of bowel with 0 accidents.  staff will provide senna daily to prevent constipation   Swallow/Nutrition/ Hydration             ADL's   Minimal to Moderate Assist level overall  Supervision overall  ADL retraining, adhereing to Hip Precautions, dynamic standing   Mobility   minA overall  s overall   safety with mobility, sit to stands, general strengthening & activity tolerance, dynamic balance   Communication             Safety/Cognition/ Behavioral  Observations            Pain   patient compalins of pain in her left hip and can have Ultram every 6 hours prn.  patient will have pain <2.  staff will assess patient each shift and prn for pain and moniotr for effectiveness.   Skin   patient has a surgical incision to her left hip that is closed with skin glue. No other skin breakdown seen on admission.  patient's incision will have no signs of infection, patient will have no skin breakdown during this admisssion  staff will inspect patient's skin daily for breakdown, staff will monitor surgical incision for signs of infection.      *See Interdisciplinary Assessment and Plan and progress notes for long and short-term goals  Barriers to Discharge: Cognition and safety awareness    Possible Resolutions to Barriers:  Repetition and family/patient education    Discharge Planning/Teaching Needs:  Home with son, daughter in-law and hired Comptroller at discharge. Hope she is ambulating with a walker at discharge      Team Discussion:  Discussion of dx, goals, d/c plan. Pt with dementia but is oriented and is able to follow through with hip precautions. Min assist overall with S goals.Diet changed to reg.  Revisions to Treatment Plan:  none   Continued Need for Acute Rehabilitation Level of Care: The patient requires daily medical management by a physician with specialized training in physical medicine and rehabilitation for the following  conditions: Daily direction of a multidisciplinary physical rehabilitation program to ensure safe treatment while eliciting the highest outcome that is of practical value to the patient.: Yes Daily medical management of patient stability for increased activity during participation in an intensive rehabilitation regime.: Yes Daily analysis of laboratory values and/or radiology reports with any subsequent need for medication adjustment of medical intervention for : Post surgical problems;Neurological  problems  Reviewed Team Conference with pt and with her son by phone. Both in agreement with goals and target d/c date of 11/03/11. Informed son that pt will require 24/7 supervision after d/c.  Meryl Dare 10/27/2011, 2:00 PM

## 2011-10-27 NOTE — Progress Notes (Signed)
ANTICOAGULATION CONSULT NOTE - Follow Up Consult  Pharmacy Consult for coumadin Indication: history of Afib/VTE prophylaxis following Left hip fracture and repair  No Known Allergies  Vital Signs: Temp: 98.2 F (36.8 C) (11/27 0631) Temp src: Oral (11/27 0631) BP: 130/84 mmHg (11/27 0631) Pulse Rate: 71  (11/27 0631)  Labs:  Basename 10/27/11 0705 10/26/11 1602 10/26/11 0519  HGB 12.8 13.3 --  HCT 38.8 39.3 38.1  PLT 344 350 302  APTT -- -- --  LABPROT 23.2* 21.7* 21.7*  INR 2.02* 1.85* 1.85*  HEPARINUNFRC -- -- --  CREATININE 0.71 0.78 0.74  CKTOTAL -- -- --  CKMB -- -- --  TROPONINI -- -- --   Estimated Creatinine Clearance: 38.8 ml/min (by C-G formula based on Cr of 0.71).  Medications:  Scheduled:    . donepezil  23 mg Oral QHS  . escitalopram  5 mg Oral Daily  . ferrous sulfate  325 mg Oral TID PC  . Flora-Q  1 capsule Oral Daily  . influenza  inactive virus vaccine  0.5 mL Intramuscular Tomorrow-1000  . levothyroxine  75 mcg Oral Daily  . losartan  50 mg Oral Daily  . memantine  10 mg Oral BID  . metoprolol  25 mg Oral TID  . senna-docusate  2 tablet Oral QHS  . Vitamin D (Ergocalciferol)  50,000 Units Oral Q Wed  . warfarin  5 mg Oral ONCE-1800  . DISCONTD: Vitamin D (Ergocalciferol)  50,000 Units Oral Q7 days    Assessment: 87 YOF on chronic warfarin therapy for afib, s/p fall and Left hip fracture and surgical repair. INR therapeutic this morning. No bleeding noted and CBC stable  Goal of Therapy:  INR 2-3   Plan:  1. Coumadin 5mg  pox1 2. Daily INR  Suzette Battiest, Tad Moore 10/27/2011,1:17 PM

## 2011-10-27 NOTE — Progress Notes (Signed)
Occupational Therapy Assessment and Plan and Session Notes  Patient Details  Name: Tasha Ball MRN: [id number] Date of Birth: 1924/06/05  OT Diagnosis: acute pain, cognitive deficits and muscle weakness (generalized) Rehab Potential: Rehab Potential: Good ELOS: 7-10 days   Assessment & Plan Clinical Impression: Tasha Ball is an 77 y.o. female with history of dementia, recent UTI, who tripped and fell on 11/16/2 and sustained acute left femoral neck fracture.Patient on chronic coumadin for chronic a-fib. Evaluated by [name] and patient underwent left hip Hemi on 10/18/11. Post op with elevated heart rate treated with IV Cardizem. [place] cards consulted and lopressor increased to q 8 hrs. Pt Is WBAT with posterior hip precautions.Placed on contact precautions for nasal positive MRSA. Requires total assist to ambulate 20 feet and multi cues due to history dementia.  Patient's past medical history is significant for: Urinary tract infection   Cancer  non-hodgkins lymphoma   Lymphoma in remission  ca free 5 yrs   Dementia   Atrial fibrillation   Patient currently requires an overall minimal to moderate assistance with basic self-care skills and IADLs secondary to muscle weakness and acute pain. Prior to hospitalization, patient could complete basic self care tasks with independence/intermittent supervision.  Patient will benefit from skilled intervention to increase independence with basic self-care skills prior to discharge home with family (son & daughter in law).  Anticipate patient will require 24 hour supervision and TBD if additional OT will be recommended.  OT - End of Session Activity Tolerance: Endurance does not limit participation in activity OT Assessment Rehab Potential: Good Barriers to Discharge: Other (comment) Barriers to Discharge Comments: None at this time OT Plan OT Frequency: 1-2 X/day, 60-90 minutes Estimated Length of Stay: 7-10 days OT  Treatment/Interventions: Ambulation/gait training;Balance/vestibular training;Community reintegration;DME/adaptive equipment instruction;Functional mobility training;Pain management;Patient/family education;Self Care/advanced ADL retraining;Therapeutic Activities;Therapeutic Exercise;UE/LE Strength taining/ROM;UE/LE Coordination activities  Precautions/Restrictions  Precautions Precautions: Posterior Hip Required Braces or Orthoses: No Restrictions Weight Bearing Restrictions: Yes LUE Weight Bearing: Weight bearing as tolerated LLE Weight Bearing: Weight bearing as tolerated General Chart Reviewed: Yes Pain Pain Assessment Pain Assessment: 0-10 Pain Score:   5 Pain Type: Surgical pain Pain Location: Hip Pain Orientation: Left Pain Descriptors: Dull Pain Intervention(s): RN aware Home Living/Prior Functioning Home Living Lives With: Family;Son (& Daughter in Social worker) Receives Help From: Family Type of Home: House Home Layout: One level Home Access: Stairs to enter Entrance Stairs-Rails: Right Entrance Stairs-Number of Steps: 2-3 Bathroom Shower/Tub: Health visitor: Standard Bathroom Accessibility: Yes How Accessible: Accessible via walker Home Adaptive Equipment: Shower chair with back IADL History Homemaking Responsibilities: No Prior Function Level of Independence: Independent with basic ADLs;Needs assistance with homemaking;Independent with gait;Independent with transfers Driving: No ADL ADL Eating: Not assessed Grooming: Supervision/safety;Setup Where Assessed-Grooming: Sitting at sink Upper Body Bathing: Supervision/safety;Setup Where Assessed-Upper Body Bathing: Sitting at sink Lower Body Bathing: Moderate assistance Where Assessed-Lower Body Bathing: Other (Comment) (sit to stand at sink) Upper Body Dressing: Supervision/safety;Setup Where Assessed-Upper Body Dressing: Sitting at sink Lower Body Dressing: Moderate assistance Where Assessed-Lower  Body Dressing: Other (Comment) (sit to stand at sink) Toileting: Dependent Where Assessed-Toileting: Bedside Commode Toilet Transfer: Minimal assistance Toilet Transfer Method: Stand pivot Toilet Transfer Equipment: Gaffer: Not assessed Film/video editor: Not assessed Vision/Perception  Vision - History Baseline Vision: No visual deficits Patient Visual Report: No change from baseline Perception Perception: Within Functional Limits  Cognition Overall Cognitive Status: Impaired at baseline Arousal/Alertness: Awake/alert Orientation Level: Oriented X4 Memory:  Impaired Memory Impairment: Decreased short term memory Awareness: Impaired Awareness Impairment: Anticipatory impairment Safety/Judgment: Impaired Comments: cueing needed for safety with mobility and to initiate/complete tasks Sensation Sensation Light Touch: Appears Intact Stereognosis: Appears Intact Hot/Cold: Appears Intact Proprioception: Appears Intact Additional Comments: Appears Intact for UEs Coordination Gross Motor Movements are Fluid and Coordinated: Yes Fine Motor Movements are Fluid and Coordinated: Yes Motor  Motor Motor: Within Functional Limits Mobility  Bed Mobility Bed Mobility: Yes Supine to Sit: 4: Min assist Sit to Supine - Left: 5: Supervision Transfers Transfers: Yes Sit to Stand: 4: Min assist Sit to Stand Details: Verbal cues for technique;Verbal cues for precautions/safety Stand to Sit: 4: Min assist Stand to Sit Details (indicate cue type and reason): Verbal cues for precautions/safety;Verbal cues for technique  Trunk/Postural Assessment  Cervical Assessment Cervical Assessment: Exceptions to [facility] (forward head) Thoracic Assessment Thoracic Assessment:  (kyphotic poster, cueing needed) Lumbar Assessment Lumbar Assessment: Within Functional Limits Postural Control Postural Control: Within Functional Limits  Extremity/Trunk Assessment RUE  Assessment RUE Assessment: Within Functional Limits LUE Assessment LUE Assessment: Within Functional Limits  Recommendations for other services: Other: none at this time  Discharge Criteria: Patient will be discharged from OT if patient refuses treatment 3 consecutive times without medical reason, if treatment goals not met, if there is a change in medical status, if patient makes no progress towards goals or if patient is discharged from hospital.  The above assessment, treatment plan, treatment alternatives and goals were discussed and mutually agreed upon: by patient  Session Notes:  One Time: 0800-0855 - 55 Minutes Individual Therapy Patient complained of 5/10 dull pain; RN aware  Initial 1:1 OT evaluation completed. Skilled OT intervention focusing on ADL retraining at sink level in sit to stand position (UB/LB bathing and dressing), pain management, toilet transfer, toileting (perineal hygiene), sit to stands, stand to sits, overall activity tolerance/endurance. Patient able to verbalize and adhere to 3/3 posterior hip precautions.   Two Time: 1400-1430 - 30 Minutes Individual Therpay No complaints of pain  Engaged in self-care home management task completing simulated walk-in shower transfer onto shower seat with back and toilet transfer onto [facility] over toilet seat. Focused intervention on functional ambulation using rolling walker, safety with walker, implementing posterior hip precautions. Made patient sign to put on patients walker with hip precautions and reminder to stay close to walker. Patient with inconsistent  memory of precautions and walker safety.   Bevin Das 10/27/2011, 10:49 AM

## 2011-10-27 NOTE — Progress Notes (Signed)
Subjective/Complaints: No new complaints slept well Review of Systems  Musculoskeletal: Positive for joint pain.  All other systems reviewed and are negative.    Objective: Vital Signs: Blood pressure 130/84, pulse 71, temperature 98.2 F (36.8 C), temperature source Oral, resp. rate 17, height 5\' 1"  (1.549 m), weight 48.626 kg (107 lb 3.2 oz), SpO2 97.00%.  Physical Exam  Constitutional: She appears well-developed.  HENT:  Head: Normocephalic.  Eyes: Pupils are equal, round, and reactive to light.  Neck: Normal range of motion.  Cardiovascular: Normal rate.   Pulmonary/Chest: Effort normal.  Abdominal: Soft.  Musculoskeletal:       Pain inhibition in left prox leg.  Strength grossly 2/5 to 4/5 prox to distal  Skin: Skin is warm.       Left hip incision clean and intact.  Appropriately tender.      Assessment/Plan: 1. Functional deficits secondary to Left FNF s/p hip arthroplasty which require 3+ hours per day of interdisciplinary therapy in a comprehensive inpatient rehab setting. Physiatrist is providing close team supervision and 24 hour management of active medical problems listed below Physiatrist and rehab team continue to assess barriers to discharge/monitor patient progress toward functional and medical goals PT OT SLP 24 hour Rehab Nursing  Medical Problem List and Plan:  1.Left femoral neck fracture-s/p hip arthroplasty 11/18.WBAT/posterior hip precautions.PT/OT eval and treat. Continue rigorous cueing given memory deficits.   1. DVT Prophylaxis/Anticoagulation: chronic coumadin for A FIB.Monitor platelets and any excessive bleeding.Follow up labs am.INR  1.73 with no bridiging of lovenox.   2. Pain Management: ultracet/robaxin.Monitor mental status. Very clear thus far  3.MRSA-contact precautions.   4.Dementia-aricept/namenda.Monitor safety.May need bed alarm as the patient will likely exhibit more cognitive deficits while in the hospital and with the  stress of this fracture and surgery.   5.HTN/HR.cozaar,lopressor.Monitor with increased activity and pain. heart rate currently controlled.   6.Mood-lexapro.ego support. Mood appears fairly positive at present.   7.Anemia-iron supplement. Hgb has been trending up.  Leukocytosis is concerning. Check ua c and s  8.Hypothyrodism-synthroid  LOS: 1 days  Tasha Ball 10/27/2011, 7:44 AM

## 2011-10-28 DIAGNOSIS — W19XXXA Unspecified fall, initial encounter: Secondary | ICD-10-CM

## 2011-10-28 DIAGNOSIS — Z5189 Encounter for other specified aftercare: Secondary | ICD-10-CM

## 2011-10-28 DIAGNOSIS — I10 Essential (primary) hypertension: Secondary | ICD-10-CM

## 2011-10-28 DIAGNOSIS — S72009A Fracture of unspecified part of neck of unspecified femur, initial encounter for closed fracture: Secondary | ICD-10-CM

## 2011-10-28 LAB — URINE CULTURE
Colony Count: NO GROWTH
Culture: NO GROWTH

## 2011-10-28 LAB — PROTIME-INR
INR: 2.32 — ABNORMAL HIGH (ref 0.00–1.49)
Prothrombin Time: 25.9 seconds — ABNORMAL HIGH (ref 11.6–15.2)

## 2011-10-28 MED ORDER — WARFARIN SODIUM 2.5 MG PO TABS
2.5000 mg | ORAL_TABLET | Freq: Once | ORAL | Status: AC
  Administered 2011-10-28: 2.5 mg via ORAL
  Filled 2011-10-28: qty 1

## 2011-10-28 NOTE — Progress Notes (Signed)
Physical Therapy Note  Patient Details  Name: Tasha Ball MRN: [id number] Date of Birth: 1924/03/04 Today's Date: 10/28/2011  Individual therapy, 1305-1350 (45 min). No c/o pain. Treatment session focused on dynamic gait through obstacle course, sidestepping, turning, and stepping over objects with RW overall steadyA. Pt requires cueing for safety with RW (stay inside walker during turns, close to walker, sequence of lifting walker and taking step over objects). Stair training with bilateral rails and single rail (practiced R and L), pt cueing for use of single rail to hold on with both hands for stability and which foot to lead with. Dynamic balance and repetition training for sit to stand and to place horseshoe onto basketball hoop, focus on technique for sit to stand and safety and for pt to get into forward weight shift in standing (without AD) to decrease posterior lean noted. Gait training x 180' with steady A with RW, cueing to stay inside RW for safety, general functional mobility training.     Karolee Stamps [facility] 10/28/2011, 1:52 PM

## 2011-10-28 NOTE — Progress Notes (Signed)
Occupational Therapy Note  Patient Details  Name: Tasha Ball MRN: [id number] Date of Birth: June 01, 1924 Today's Date: 10/28/2011  9811-9147 - 40 Minutes Individual Therapy No complaints of pain   Bathing completed by NT. Engaged in dressing at w/c level in sit to stand position. Introduced Sports administrator and sock aid to increase independence with LB ADLs. Dynamic standing balance/endurance in front of sink using rolling walker for grooming tasks. Patient ambulated in bathroom for toilet transfer onto elevated toilet seat with steady assist on/off elevated toilet seat and toileting (perineal hygiene and clothes management) with steady assist. Patient then ambulated back to bed for bed mobility; supervision to sit edge of bed and supervision sit->supine. Patient able to verbalize 2/3 hip precautions, with min verbal cues to adhere to no bending/90 degree sitting position. Patient shows poor short term memory; discussing same situations and talking about same people over and over.   Gena Laski 10/28/2011, 11:30 AM

## 2011-10-28 NOTE — Progress Notes (Signed)
Patient ID: Tasha Ball, female   DOB: November 29, 1924, 77 y.o.   MRN: [id number] Subjective/Complaints: Review of Systems  Musculoskeletal: Positive for joint pain.  All other systems reviewed and are negative.     Objective: Vital Signs: Blood pressure 92/60, pulse 60, temperature 98 F (36.7 C), temperature source Oral, resp. rate 20, height 5\' 1"  (1.549 m), weight 48.626 kg (107 lb 3.2 oz), SpO2 99.00%. No results found.  Basename 10/27/11 0705 10/26/11 1602  WBC 10.7* 12.6*  HGB 12.8 13.3  HCT 38.8 39.3  PLT 344 350    Basename 10/27/11 0705 10/26/11 1602  NA 133* 131*  K 3.8 4.7  CL 95* 93*  CO2 26 29  GLUCOSE 86 196*  BUN 12 13  CREATININE 0.71 0.78  CALCIUM 8.6 9.1   CBG (last 3)   Basename 10/25/11 1647  GLUCAP 97    Wt Readings from Last 3 Encounters:  10/26/11 48.626 kg (107 lb 3.2 oz)  10/26/11 48.898 kg (107 lb 12.8 oz)  10/26/11 48.898 kg (107 lb 12.8 oz)    Physical Exam:  General appearance: alert, cooperative and no distress Head: Normocephalic, without obvious abnormality, atraumatic Eyes: conjunctivae/corneas clear. PERRL, EOM's intact. Fundi benign. Ears: normal TM's and external ear canals both ears Nose: Nares normal. Septum midline. Mucosa normal. No drainage or sinus tenderness. Throat: lips, mucosa, and tongue normal; teeth and gums normal Neck: no adenopathy, no carotid bruit, no JVD, supple, symmetrical, trachea midline and thyroid not enlarged, symmetric, no tenderness/mass/nodules Back: symmetric, no curvature. ROM normal. No CVA tenderness. Resp: clear to auscultation bilaterally Cardio: regular rate and rhythm, S1, S2 normal, no murmur, click, rub or gallop GI: soft, non-tender; bowel sounds normal; no masses,  no organomegaly Extremities: extremities normal, atraumatic, no cyanosis or edema Pulses: 2+ and symmetric Skin: Skin color, texture, turgor normal. No rashes or lesions mild bruising at hip Neurologic: Grossly  normal Incision/Wound: wound clean and intact   Assessment/Plan: 1. Functional deficits secondary to Left Femoral Neck Fx s/p arthroplasty which require 3+ hours per day of interdisciplinary therapy in a comprehensive inpatient rehab setting. Physiatrist is providing close team supervision and 24 hour management of active medical problems listed below. Physiatrist and rehab team continue to assess barriers to discharge/monitor patient progress toward functional and medical goals. Mobility: Bed Mobility Bed Mobility: Yes Supine to Sit: 4: Min assist Sit to Supine - Left: 5: Supervision Transfers Transfers: Yes Sit to Stand: 4: Min assist Stand to Sit: 4: Min assist Stand Pivot Transfers: 4: Min assist Ambulation/Gait Ambulation/Gait Assistance: 4: Min assist Ambulation/Gait Assistance Details (indicate cue type and reason): with RW, minA but requires mod cueing for safety with device Ambulation Distance (Feet): 15 Feet Assistive device: 1 person hand held assist Gait Pattern: Decreased stance time - left;Decreased stride length;Right steppage;Left steppage (narrow BOS) Stairs: Yes Stairs Assistance: 4: Min assist Stair Management Technique: Two rails;Step to pattern Number of Stairs: 10  Height of Stairs: 5  Wheelchair Mobility Wheelchair Mobility: No ADL:   Cognition: Cognition Overall Cognitive Status: Impaired at baseline Arousal/Alertness: Awake/alert Orientation Level: Oriented X4 Memory: Impaired Memory Impairment: Decreased short term memory Awareness: Impaired Awareness Impairment: Anticipatory impairment Safety/Judgment: Impaired Comments: cueing needed for safety with mobility Cognition Arousal/Alertness: Awake/alert Orientation Level: Oriented X4   Medical Problem List and Plan:  1.Left femoral neck fracture-s/p hip arthroplasty 11/18.WBAT/posterior hip precautions.PT/OT eval and treat. Participating well with therapies. Carryover adequate.  1. DVT  Prophylaxis/Anticoagulation: chronic coumadin for A FIB.Monitor platelets and any  excessive bleeding.Follow up labs am.INR  1.73 with no bridiging of lovenox.   2. Pain Management: ultracet/robaxin.Monitor mental status. Pain well controlled.  3.MRSA-contact precautions.   4.Dementia-aricept/namenda.Monitor safety. Doing surprisingly well at this time.   5.HTN/HR.cozaar,lopressor.Monitor with increased activity and pain. heart rate currently controlled.   6.Mood-lexapro.ego support. Mood appears fairly positive at present.   7.Anemia-iron supplement. Hgb has been trending up.  Leukocytosis improved. Urinalysis +/-.  Culture pending.  8.Hypothyrodism-synthroid    LOS (Days) 2  SWARTZ,ZACHARY T 10/28/2011, 7:46 AM

## 2011-10-28 NOTE — Progress Notes (Signed)
Physical Therapy Note  Patient Details  Name: Tasha Ball MRN: [id number] Date of Birth: [birth year] Today's Date: 10/28/2011  Individual therapy 800-900. Denies Pain. Supervision for supine to sit EOB, with 1 LOB posterior in sitting caught herself with UEs for support. Required modA for sit to stand initially due to significant posterior lean with LOB posterior, cueing for anterior weightshift and manual facilitation. MinA for stand pivot part of transfer to w/c. Nustep for LE strengthening and general activity tolerance x 6 min on level 2. Gait training with RW x 60' with minA overall, cues needed for safety with AD and posture. Stair training with bilateral rails x 5 steps overall minA but posterior lean noted with descending steps requiring manual facilitation for posture and safety. Dynamic standing balance activity for cone stacking with reaching across midline and rotation with emphasis on standing balance and anterior weightshifts. Pt continues to require constant cueing for sit to stand hand placement for safety, tends to pull up on RW.   Tedd Sias 10/28/2011, 10:07 AM

## 2011-10-28 NOTE — Progress Notes (Signed)
Occupational Therapy Note  Patient Details  Name: Tasha Ball MRN: [id number] Date of Birth: [birth year] Today's Date: 10/28/2011  5284-1324 - 43 Minutes Individual Therapy No complaints of pain  Engaged in simple home management task in ADL apartment focusing on functional mobility using rolling walker, overall activity tolerance/endurance, dynamic standing balance/endurance. Patient with poor safety awareness, requiring max verbal cues to remain inside walker, keep both hands on walker, and pay attention to walker while in kitchen. Patient tended to furniture walk and forget about walker, almost causing walker to fall to the ground which could potentially be a fall risk/hazard.    Terrelle Ruffolo 10/28/2011, 3:26 PM

## 2011-10-28 NOTE — Progress Notes (Signed)
ANTICOAGULATION CONSULT NOTE - Follow Up Consult  Pharmacy Consult for Coumadin Indication: history of Afib/VTE prophylaxis following Left hip fracture and repair  No Known Allergies  Vital Signs: Temp: 98 F (36.7 C) (11/28 0556) Temp src: Oral (11/28 0556) BP: 92/60 mmHg (11/28 0556) Pulse Rate: 60  (11/28 0556)  Labs:  Basename 10/28/11 0630 10/27/11 0705 10/26/11 1602 10/26/11 0519  HGB -- 12.8 13.3 --  HCT -- 38.8 39.3 38.1  PLT -- 344 350 302  APTT -- -- -- --  LABPROT 25.9* 23.2* 21.7* --  INR 2.32* 2.02* 1.85* --  HEPARINUNFRC -- -- -- --  CREATININE -- 0.71 0.78 0.74  CKTOTAL -- -- -- --  CKMB -- -- -- --  TROPONINI -- -- -- --   Estimated Creatinine Clearance: 38.8 ml/min (by C-G formula based on Cr of 0.71).   Medications:  Scheduled:    . donepezil  23 mg Oral QHS  . escitalopram  5 mg Oral Daily  . ferrous sulfate  325 mg Oral TID PC  . Flora-Q  1 capsule Oral Daily  . influenza  inactive virus vaccine  0.5 mL Intramuscular Tomorrow-1000  . levothyroxine  75 mcg Oral Daily  . losartan  50 mg Oral Daily  . memantine  10 mg Oral BID  . metoprolol  25 mg Oral TID  . senna-docusate  2 tablet Oral QHS  . Vitamin D (Ergocalciferol)  50,000 Units Oral Q Wed  . warfarin  5 mg Oral ONCE-1800   Assessment:  87 YOF on chronic warfarin therapy for afib, s/p fall and Left hip fracture and surgical repair. INR therapeutic this morning. No bleeding noted.  Goal of Therapy:  INR 2-3   Plan:  1. Coumadin 2.5mg  pox1  2. Daily INR    Zeigler, Tad Moore 10/28/2011,12:37 PM

## 2011-10-29 MED ORDER — WARFARIN SODIUM 2.5 MG PO TABS
2.5000 mg | ORAL_TABLET | Freq: Once | ORAL | Status: AC
  Administered 2011-10-29: 2.5 mg via ORAL
  Filled 2011-10-29: qty 1

## 2011-10-29 MED ORDER — TRAZODONE HCL 50 MG PO TABS
25.0000 mg | ORAL_TABLET | Freq: Every evening | ORAL | Status: DC | PRN
  Administered 2011-10-29 – 2011-11-02 (×3): 25 mg via ORAL
  Filled 2011-10-29 (×3): qty 1

## 2011-10-29 NOTE — Progress Notes (Signed)
Occupational Therapy Note  Patient Details  Name: Tasha Ball MRN: [id number] Date of Birth: Jul 24, 1924 Today's Date: 10/29/2011 Time:1345-1430 45 min  Skilled Intevention: Functional ambulation @ kitchen to gather items with vc for hip precautions @ RW level. vc for safe use of walker. Negotiated gift shop  @ RW level with min vc for safety. Written guidelines taped to walker to compensate for STM deficits. Pleasant and making good progress.  Individual session. Tasha Ball,Tasha Ball 10/29/2011, 4:51 PM

## 2011-10-29 NOTE — Progress Notes (Signed)
Alert and orientated with short term memory deficit evident. Ambulateswith single point cane with cues for safety. Poor safety awareness with bed alarm in place.  No unsafe behavior exhibited this shift. Requiring frequent reminders regarding hip precaution. L  hip incision with steri strips intact. Remains continent  of bowel and bladder.  Last bowel movement 10/27/11. No compliant of pain at this time. Participating in therapy. Resting in bed between sessions.

## 2011-10-29 NOTE — Progress Notes (Signed)
Physical Therapy Session Note  Patient Details  Name: Tasha Ball MRN: [id number] Date of Birth: Apr 07, 1924  Today's Date: 10/29/2011 Time:  4098-1191  Total Time: 45 minutes  Precautions: Precautions Precautions: Posterior Hip Required Braces or Orthoses: No Restrictions Weight Bearing Restrictions: Yes LLE Weight Bearing: Weight bearing as tolerated  Mobility Transfers Transfers: Yes Sit to Stand: 4: Min assist Sit to Stand Details: Verbal cues for precautions/safety;Verbal cues for technique Stand to Sit: 4: Min assist Stand to Sit Details (indicate cue type and reason): Verbal cues for precautions/safety;Verbal cues for technique   Skilled Therapeutic Interventions/Progress Updates:  Obstacle course 50 feetx2 with min A with RW focusing on walker safety and foot placement.  Pt requires max vc to maintain walker safety, including staying inside the walker, decreasing cadence, and widening her base of support, especially during tight turns. Pt currently very unsafe with use of RW, tends to focus more on pushing the RW out of her way during gait resulting in decreased attention to functional task and balance increasing risk of fall.   Gait 7ft x2 trials with left HHA focusing on balance and foot placement without an AD: Pt tends to veer left and look for support on left.  Obstacle course with SPC min A x2 trials focusing on navigating tight spaces and safe foot placement with SPC to decrease risk of falls: 1st trial SPC in R hand pt loss of balance right and posteriorly x2 trying to grab with Left hand and lifting SPC off ground, looking for support with left; 2nd trial SPC in L hand pt correctly sequenced 2 point gait pattern with loss of balance x1 left and posteriorly with SPC on ground using it for support. Improved cadence, balance, and sequencing with use of SPC in let hand noted. Recommend continued gait with SPC used in L hand for functional mobility and increased safety in order  to decrease risk of falls.  Obstacle course with SPC in Left hand min A x2 trials, focusing on safe navigation over objects and foot placement during tight turns to decrease risk of falls.  With SPC in left hand, pt requires vc ~25-50% of time for increased BOS.   Therapy/Group: Individual Therapy  Christianne Dolin 10/29/2011, 10:17 AM

## 2011-10-29 NOTE — Progress Notes (Signed)
ANTICOAGULATION CONSULT NOTE - Follow Up Consult  Pharmacy Consult for Coumadin Indication: history of Afib/VTE prophylaxis following Left hip fracture and repair  Vital Signs: Temp: 97.3 F (36.3 C) (11/29 0526) Temp src: Oral (11/29 0526) BP: 127/83 mmHg (11/29 0526) Pulse Rate: 70  (11/29 0526)  Labs:  Basename 10/29/11 0655 10/28/11 0630 10/27/11 0705 10/26/11 1602  HGB -- -- 12.8 13.3  HCT -- -- 38.8 39.3  PLT -- -- 344 350  APTT -- -- -- --  LABPROT 29.2* 25.9* 23.2* --  INR 2.71* 2.32* 2.02* --  HEPARINUNFRC -- -- -- --  CREATININE -- -- 0.71 0.78  CKTOTAL -- -- -- --  CKMB -- -- -- --  TROPONINI -- -- -- --   Estimated Creatinine Clearance: 38.8 ml/min (by C-G formula based on Cr of 0.71).  Medications:  Scheduled:    . donepezil  23 mg Oral QHS  . escitalopram  5 mg Oral Daily  . ferrous sulfate  325 mg Oral TID PC  . Flora-Q  1 capsule Oral Daily  . levothyroxine  75 mcg Oral Daily  . losartan  50 mg Oral Daily  . memantine  10 mg Oral BID  . metoprolol  25 mg Oral TID  . senna-docusate  2 tablet Oral QHS  . Vitamin D (Ergocalciferol)  50,000 Units Oral Q Wed  . warfarin  2.5 mg Oral ONCE-1800    Assessment:  87 YOF on chronic warfarin therapy for afib, s/p fall and Left hip fracture and surgical repair. INR therapeutic this morning (likely still effects from 5mg  dose 11/27), trending up. No bleeding noted.   Goal of Therapy:  INR 2-3   Plan:  1. Coumadin 2.5mg  pox1  2. Daily INR  Dannielle Huh 10/29/2011,11:07 AM

## 2011-10-29 NOTE — Progress Notes (Signed)
Patient ID: [name], female   DOB: [birth year], 77 y.o.   MRN: [id number] Patient ID: [name], female   DOB: 1924-06-08, 77 y.o.   MRN: [id number] Subjective/Complaints: Review of Systems  Musculoskeletal: Positive for joint pain.  All other systems reviewed and are negative.     Objective: Vital Signs: Blood pressure 127/83, pulse 70, temperature 97.3 F (36.3 C), temperature source Oral, resp. rate 16, height 5\' 1"  (1.549 m), weight 48.943 kg (107 lb 14.4 oz), SpO2 95.00%. No results found.  Basename 10/27/11 0705 10/26/11 1602  WBC 10.7* 12.6*  HGB 12.8 13.3  HCT 38.8 39.3  PLT 344 350    Basename 10/27/11 0705 10/26/11 1602  NA 133* 131*  K 3.8 4.7  CL 95* 93*  CO2 26 29  GLUCOSE 86 196*  BUN 12 13  CREATININE 0.71 0.78  CALCIUM 8.6 9.1   CBG (last 3)  No results found for this basename: GLUCAP:3 in the last 72 hours  Wt Readings from Last 3 Encounters:  10/28/11 48.943 kg (107 lb 14.4 oz)  10/26/11 48.898 kg (107 lb 12.8 oz)  10/26/11 48.898 kg (107 lb 12.8 oz)    Physical Exam:  General appearance: alert, cooperative and no distress Head: Normocephalic, without obvious abnormality, atraumatic Eyes: conjunctivae/corneas clear. PERRL, EOM's intact. Fundi benign. Ears: normal TM's and external ear canals both ears Nose: Nares normal. Septum midline. Mucosa normal. No drainage or sinus tenderness. Throat: lips, mucosa, and tongue normal; teeth and gums normal Neck: no adenopathy, no carotid bruit, no JVD, supple, symmetrical, trachea midline and thyroid not enlarged, symmetric, no tenderness/mass/nodules Back: symmetric, no curvature. ROM normal. No CVA tenderness. Resp: clear to auscultation bilaterally Cardio: regular rate and rhythm, S1, S2 normal, no murmur, click, rub or gallop GI: soft, non-tender; bowel sounds normal; no masses,  no organomegaly Extremities: extremities normal, atraumatic, no cyanosis or edema Pulses: 2+ and symmetric Skin: Skin  color, texture, turgor normal. No rashes or lesions mild bruising at hip Neurologic: Grossly normal Incision/Wound: wound clean and intact   Assessment/Plan: 1. Functional deficits secondary to Left Femoral Neck Fx s/p arthroplasty which require 3+ hours per day of interdisciplinary therapy in a comprehensive inpatient rehab setting. Physiatrist is providing close team supervision and 24 hour management of active medical problems listed below. Physiatrist and rehab team continue to assess barriers to discharge/monitor patient progress toward functional and medical goals.  Pt is cooperative and enthusiastically working toward goals  Mobility: Bed Mobility Bed Mobility: Yes Supine to Sit: 4: Min assist Sit to Supine - Left: 5: Supervision Transfers Transfers: Yes Sit to Stand: 4: Min assist Stand to Sit: 4: Min assist Stand Pivot Transfers: 4: Min assist Ambulation/Gait Ambulation/Gait Assistance: 4: Min assist Ambulation/Gait Assistance Details (indicate cue type and reason): with RW, minA but requires mod cueing for safety with device Ambulation Distance (Feet): 15 Feet Assistive device: 1 person hand held assist Gait Pattern: Decreased stance time - left;Decreased stride length;Right steppage;Left steppage (narrow BOS) Stairs: Yes Stairs Assistance: 4: Min assist Stair Management Technique: Two rails;Step to pattern Number of Stairs: 10  Height of Stairs: 5  Wheelchair Mobility Wheelchair Mobility: No ADL:   Cognition: Cognition Overall Cognitive Status: Impaired at baseline Arousal/Alertness: Awake/alert Orientation Level: Oriented X4 Memory: Impaired Memory Impairment: Decreased short term memory Awareness: Impaired Awareness Impairment: Anticipatory impairment Safety/Judgment: Impaired Comments: cueing needed for safety with mobility Cognition Arousal/Alertness: Awake/alert Orientation Level: Oriented X4   Medical Problem List and Plan:  1.Left femoral  neck  fracture-s/p hip arthroplasty 11/18.WBAT/posterior hip precautions.PT/OT eval and treat. Participating well with therapies. Carryover adequate.  1. DVT Prophylaxis/Anticoagulation: chronic coumadin for A FIB.Monitor platelets and any excessive bleeding.Follow up labs am.INR  1.73 with no bridiging of lovenox.   2. Pain Management: ultracet/robaxin.Monitor mental status. Pain well controlled.  3.MRSA-contact precautions.   4.Dementia-aricept/namenda.Monitor safety. Doing surprisingly well at this time with only mild short term memory deficits.  5.HTN/HR.cozaar,lopressor.Monitor with increased activity and pain. heart rate currently controlled.   6.Mood-lexapro.ego support. Mood appears fairly positive at present.   7.Anemia-iron supplement. Hgb has been trending up.    8. Leukocytosis improved. Urinalysis +/-.  Culture negative  8.Hypothyrodism-synthroid    LOS (Days) 3  Darwin Guastella T 10/29/2011, 7:46 AM

## 2011-10-29 NOTE — Progress Notes (Signed)
Occupational Therapy Note  Patient Details  Name: Tasha Ball MRN: [id number] Date of Birth: May 26, 1924 Today's Date: 10/29/2011  1191-4782 - 57 Minutes Individual Therapy No complaints of pain  Engaged in ADL retraining at shower level in room walk-in shower using tub transfer bench in sit to stand position. Focused skilled intervention on increasing independence with UB/LB bathing and dressing, using AE to increase independence, dynamic standing balance/endurance, functional ambulation throughout room using cane(newly given by physical therapist), ambulating toilet transfer, toileting(including perineal hygiene and clothes management), increasing overall activity tolerance/endurance. Patient requires min-mod verbal cues for safety and to effectively complete tasks.   [name] 10/29/2011, 10:04 AM

## 2011-10-29 NOTE — Progress Notes (Signed)
Physical Therapy Note  Patient Details  Name: Tasha Ball MRN: [id number] Date of Birth: 07/23/1924 Today's Date: 10/29/2011  Individual therapy, 1110-1152 (42 minutes). No c/o pain. Pt appears to be more confused today, repeating things she just said within a few minutes ago more frequently than noted before. Focus on dynamic gait training with SPC in home environment in the apartment setting to get in and out of bed, go through kitchen to get things out of cabinet and dishwasher and various furniture transfers. Pt overall requires minA and max cueing for safety and cues for hip precautions with functional tasks in the kitchen (ex. Bending over to get item out of dishwasher). Car transfer with close S, and cues needed to maintain hip precautions. Gait in controlled environment with SPC >150' with minA for general mobility training and strengthening.  Discussed level of supervision with case management and social worker and need for family education due to pt requiring max cues and very close supervision for any mobility.   Karolee Stamps [facility] 10/29/2011, 11:55 AM

## 2011-10-30 DIAGNOSIS — I10 Essential (primary) hypertension: Secondary | ICD-10-CM

## 2011-10-30 DIAGNOSIS — W010XXA Fall on same level from slipping, tripping and stumbling without subsequent striking against object, initial encounter: Secondary | ICD-10-CM

## 2011-10-30 DIAGNOSIS — Z5189 Encounter for other specified aftercare: Secondary | ICD-10-CM

## 2011-10-30 DIAGNOSIS — S72009A Fracture of unspecified part of neck of unspecified femur, initial encounter for closed fracture: Secondary | ICD-10-CM

## 2011-10-30 LAB — PROTIME-INR
INR: 2.57 — ABNORMAL HIGH (ref 0.00–1.49)
Prothrombin Time: 28 seconds — ABNORMAL HIGH (ref 11.6–15.2)

## 2011-10-30 MED ORDER — WARFARIN SODIUM 5 MG PO TABS
5.0000 mg | ORAL_TABLET | Freq: Once | ORAL | Status: AC
  Administered 2011-10-30: 5 mg via ORAL
  Filled 2011-10-30: qty 1

## 2011-10-30 NOTE — Progress Notes (Signed)
Per State Regulation 482.30 This chart was reviewed for medical necessity with respect to the patient's Admission/Duration of stay. Pt participating with slow, steady progression toward goals. Poor safety awareness, requiring max cues for safety and for hip precautions. Bed alarm in place. Plan for pt's son and dtr-in-law to come for education in preparation for d/c next week. Pt's son aware that pt will require 24/7 close supervision for safety.   Meryl Dare                 Nurse Care Manager              Next Review Date: 11/02/11

## 2011-10-30 NOTE — Progress Notes (Signed)
Physical Therapy Session Note  Patient Details  Name: Tasha Ball MRN: [id number] Date of Birth: [birth year]  Today's Date: 10/30/2011 Time: 7829-5621 Time Calculation (min): 55 min  Precautions: Precautions Precautions: Posterior Hip Required Braces or Orthoses: No Restrictions Weight Bearing Restrictions: Yes LUE Weight Bearing: Weight bearing as tolerated LLE Weight Bearing: Weight bearing as tolerated  Short Term Goals: PT Short Term Goal 1: =LTGS  Skilled Therapeutic Interventions/Progress Updates:    Pt has been using both cane and RW. Pt asked twice and gave same answer, she prefers the RW.  Pt with cognitive impairments related to dementia thatl imit carryover of learning new info. Family ed to occur next week.    Pain Pain Assessment Pain Assessment: 0-10 Pain Score:   4 Pain Type: Acute pain;Surgical pain Pain Location: Hip Pain Onset: On-going Pain Intervention(s): RN made aware Multiple Pain Sites: No Mobility Transfers Sit to Stand: 5: Supervision;With upper extremity assist;With armrests;From chair/3-in-1 Sit to Stand Details: Verbal cues for precautions/safety Stand to Sit: 5: Supervision Stand to Sit Details (indicate cue type and reason): Verbal cues for precautions/safety Stand to Sit Details: written instruction and pictures on RW for THP reference to aid carry over Locomotion  Ambulation Ambulation/Gait Assistance: 5: Supervision Assistive device: Rolling walker Ambulation/Gait Assistance Details (indicate cue type and reason): S to adhere to THP on turns and for safe RW use in household environement Gait Gait Pattern: Step-through pattern Gait velocity: 10 meter walk test 20 seconds (increased fall risk > 18.2 ft/sec) High Level Ambulation Backwards Walking: S, worked on opening and closing doors with instructional cues for Risk analyst / Additional Locomotion Stairs: Yes Stairs Assistance: 4: Min assist Stairs Assistance Details: Verbal  cues for sequencing Stair Management Technique: One rail Right;Two rails Number of Stairs: 10  Height of Stairs: 6  Curb: 5: Supervision;Other (comment) (with RW x 2 instructional cues)     Balance Static Standing Balance Static Standing - Level of Assistance: 6: Modified independent (Device/Increase time) Dynamic Standing Balance Dynamic Standing - Balance Activities: Other (comment) (rebounder with soccer ball, close S )  Exercises Total Joint Exercises Hip ABduction/ADduction: AROM;10 reps;Standing;Strengthening;Both (3 lbs. on RLE) Long Arc Quad: 15 reps;Both;Strengthening;Weights (3# on RLE) Knee Flexion: Standing;15 reps;Strengthening;Both (3# on R) Marching in Standing: Strengthening;Both;10 reps;Standing (3#  on R) Standing Hip Extension: Other reps (comment);Strengthening;Standing (3# on R) Other Exercises Other Exercises: standing toe raises x 15      Therapy/Group: Individual Therapy  Michaelene Song 10/30/2011, 3:00 PM

## 2011-10-30 NOTE — Progress Notes (Signed)
Patient alert and oriented X3, short term memory deficit noted.  Use call bell appropriately for assistance.  Bed alarm in place for safety.  Lt. Hip incision with steri strips clean, dry, and intact.  Ambulating with a walker needs cues for safety.  Patient is continent of bowel and bladder.  LBM 10/29/11.  No complain of pain at this time.  Pt. on regular diet, take pills whole. Participating in therapy. Continue with plan of care.  Leland Johns, RN

## 2011-10-30 NOTE — Progress Notes (Signed)
Occupational Therapy Session Note  Patient Details  Name: ATHALEE ESTERLINE MRN: [id number] Date of Birth: 04/13/1924  Today's Date: 10/30/2011 Time: 0830-0925 Time Calculation (min): 55 min  Precautions: Precautions Precautions: Posterior Hip Required Braces or Orthoses: No Restrictions Weight Bearing Restrictions: Yes LUE Weight Bearing: Weight bearing as tolerated LLE Weight Bearing: Weight bearing as tolerated   Skilled Therapeutic Interventions/Progress Updates:    ADL retraining including bathing and dressing at w/c level at sink.  Pt declined shower stating she had taken shower previous day.  Pt on toilet when therapist entered room.  Pt ambulated with r/w to sink for bathing and dressing.  Pt used visual aids to state 3/3 hip precautions.  Pt able to follow hip precautions functionally with questioning verbal cues.  Pt required rest breaks X 3 during therapy session.  Skilled intervention focus on activity tolerance, safety with hip precautions, and standing balance.  General    Pain Pain Assessment Pain Assessment: No/denies pain   Therapy/Group: Individual Therapy  Rich Brave 10/30/2011, 9:27 AM

## 2011-10-30 NOTE — Progress Notes (Signed)
Occupational Therapy Session Note  Patient Details  Name: Tasha Ball MRN: [id number] Date of Birth: June 17, 1924  Today's Date: 10/30/2011 Time: 1002-1030 Time Calculation (min): 28 min  Precautions: Precautions Precautions: Posterior Hip Required Braces or Orthoses: No Restrictions Weight Bearing Restrictions: Yes LUE Weight Bearing: Weight bearing as tolerated LLE Weight Bearing: Weight bearing as tolerated  Short Term Goals: OT Short Term Goal 1: Short Term Goals = Long Term Goals  Skilled Therapeutic Interventions/Progress Updates:    OT session with focus on recalling hip precautions and adhering to hip precautions throughout session.  Pt recalled 2/3 precautions and was given questioning cues to figure out 3rd precaution -- sign on RW with precautions listed to help pt recall.  Functional mobility with RW and managing RW and watering can to water plants.  Encouraged pt to slide can along counter to free up both hands for use on RW instead of trying to hold it and use RW.  Pt required 2-3 rest breaks during session, demonstrated appropriate safety awareness with sit to stand with min verbal cues to keep RW close with all turns.  Pain Pain Assessment Pain Assessment: No/denies pain  Therapy/Group: Individual Therapy  Leonette Monarch 10/30/2011, 10:40 AM

## 2011-10-30 NOTE — Progress Notes (Signed)
Social Work Patient ID: Tasha Ball, female   DOB: 12/19/1923, 77 y.o.   MRN: [id number]   Message left for son to inform family ed this weekend or Monday. Aware discharge date is Tuesday and the close supervision  Recommended.  Genevieve Norlander is following pt she was an active pt prior to admission.  Will discuss cane and 3in1 when talk with son. Continue to work towards discharge Tuesday. Pt is pleased how well she is doing and will be ready to go home Tuesday.

## 2011-10-30 NOTE — Progress Notes (Signed)
Patient ID: Tasha Ball, female   DOB: 07-26-1924, 77 y.o.   MRN: [id number] Subjective/Complaints: I don't know if I'd had a BM.  Knows today is Friday.  Nsg reports good BM yesterday Review of Systems  Musculoskeletal: Positive for joint pain.  All other systems reviewed and are negative.     Objective: Vital Signs: Blood pressure 131/82, pulse 70, temperature 98 F (36.7 C), temperature source Oral, resp. rate 18, height 5\' 1"  (1.549 m), weight 48.943 kg (107 lb 14.4 oz), SpO2 97.00%. No results found. No results found for this basename: WBC:2,HGB:2,HCT:2,PLT:2 in the last 72 hours No results found for this basename: NA:2,K:2,CL:2,CO2:2,GLUCOSE:2,BUN:2,CREATININE:2,CALCIUM:2 in the last 72 hours CBG (last 3)  No results found for this basename: GLUCAP:3 in the last 72 hours  Wt Readings from Last 3 Encounters:  10/28/11 48.943 kg (107 lb 14.4 oz)  10/26/11 48.898 kg (107 lb 12.8 oz)  10/26/11 48.898 kg (107 lb 12.8 oz)    Physical Exam:  General appearance: alert, cooperative and no distress Head: Normocephalic, without obvious abnormality, atraumatic Eyes: conjunctivae/corneas clear. PERRL, EOM's intact. Fundi benign. Ears: normal TM's and external ear canals both ears Nose: Nares normal. Septum midline. Mucosa normal. No drainage or sinus tenderness. Throat: lips, mucosa, and tongue normal; teeth and gums normal Neck: no adenopathy, no carotid bruit, no JVD, supple, symmetrical, trachea midline and thyroid not enlarged, symmetric, no tenderness/mass/nodules Back: symmetric, no curvature. ROM normal. No CVA tenderness. Resp: clear to auscultation bilaterally Cardio: regular rate and rhythm, S1, S2 normal, no murmur, click, rub or gallop GI: soft, non-tender; bowel sounds normal; no masses,  no organomegaly Extremities: extremities normal, atraumatic, no cyanosis or edema Pulses: 2+ and symmetric Skin: Skin color, texture, turgor normal. No rashes or lesions mild bruising  at hip Neurologic: Grossly normal Incision/Wound: wound clean and intact   Assessment/Plan: 1. Functional deficits secondary to Left Femoral Neck Fx s/p arthroplasty which require 3+ hours per day of interdisciplinary therapy in a comprehensive inpatient rehab setting. Physiatrist is providing close team supervision and 24 hour management of active medical problems listed below. Physiatrist and rehab team continue to assess barriers to discharge/monitor patient progress toward functional and medical goals.  Pt is cooperative and enthusiastically working toward goals  Mobility: Bed Mobility Bed Mobility: Yes Supine to Sit: 4: Min assist Sit to Supine - Left: 5: Supervision Transfers Transfers: Yes Sit to Stand: 4: Min assist Stand to Sit: 4: Min assist Stand Pivot Transfers: 4: Min assist Ambulation/Gait Ambulation/Gait Assistance: 4: Min assist Ambulation/Gait Assistance Details (indicate cue type and reason): with RW, minA but requires mod cueing for safety with device Ambulation Distance (Feet): 15 Feet Assistive device: Rolling walker;Straight cane;Other (Comment);1 person hand held assist (See comments under therapeutic interventions) Gait Pattern: Step-through pattern;Decreased stance time - left;Decreased step length - right (narrow BOS) Stairs: Yes Stairs Assistance: 4: Min assist Stair Management Technique: Two rails;Step to pattern Number of Stairs: 10  Height of Stairs: 5  Wheelchair Mobility Wheelchair Mobility: No ADL:   Cognition: Cognition Overall Cognitive Status: Impaired at baseline Arousal/Alertness: Awake/alert Orientation Level: Oriented to person;Oriented to place;Oriented to time Memory: Impaired Memory Impairment: Decreased short term memory Awareness: Impaired Awareness Impairment: Anticipatory impairment Safety/Judgment: Impaired Comments: cueing needed for safety with mobility Cognition Arousal/Alertness: Awake/alert Orientation Level:  Oriented to person;Oriented to place;Oriented to time   Medical Problem List and Plan:  1.Left femoral neck fracture-s/p hip arthroplasty 11/18.WBAT/posterior hip precautions.PT/OT eval and treat. Participating well with therapies. Carryover adequate.  1. DVT Prophylaxis/Anticoagulation: chronic coumadin for A FIB.Monitor platelets and any excessive bleeding.Follow up labs am.INR  1.73 with no bridiging of lovenox.   2. Pain Management: ultracet/robaxin.Monitor mental status. Pain well controlled.  3.MRSA-contact precautions.   4.Dementia-aricept/namenda.Monitor safety. Doing surprisingly well at this time with only mild short term memory deficits.  5.HTN/HR.cozaar,lopressor.Monitor with increased activity and pain. heart rate currently controlled.   6.Mood-lexapro.ego support. Mood appears fairly positive at present.   7.Anemia-iron supplement. Hgb has been trending up.    8. Leukocytosis improved. Urinalysis +/-.  Culture negative  8.Hypothyrodism-synthroid    LOS (Days) 4  KIRSTEINS,ANDREW E 10/30/2011, 7:51 AM

## 2011-10-30 NOTE — Progress Notes (Signed)
Occupational Therapy Session Note  Patient Details  Name: Tasha Ball MRN: [id number] Date of Birth: January 02, 1924  Today's Date: 10/30/2011 Time: 13:00-13:30 Time Calculation (min):28min  Precautions: Precautions Precautions: Posterior Hip Required Braces or Orthoses: No Restrictions Weight Bearing Restrictions: Yes LUE Weight Bearing: Weight bearing as tolerated LLE Weight Bearing: Weight bearing as tolerated  Short Term Goals: OT Short Term Goal 1: Short Term Goals = Long Term Goals  Skilled Therapeutic Interventions/Progress Updates:    Skilled OT to address posterior hip precautions.  Pt. Used written sheet and read 2/3.    She was supervision with supine to sit.  Required manual facilitation for hand placement for reaching back for arm rests and pushing up from chair.  Stood during functional tasks(wrapping presents) for 5 minutes x4 with supervision for balance.  Pt. Used RW for funtional mobility with close supervision.     Pain: 3/10.  No intervention needed    Therapy/Group: Individual Therapy  Humberto Seals 10/30/2011, 2:31 PM

## 2011-10-30 NOTE — Progress Notes (Signed)
ANTICOAGULATION CONSULT NOTE - Follow Up Consult  Pharmacy Consult for Coumadin Indication: history of Afib/VTE prophylaxis following left hip fracture and repair  Vital Signs: Temp: 98 F (36.7 C) (11/30 0500) Temp src: Oral (11/30 0500) BP: 131/82 mmHg (11/30 0758) Pulse Rate: 70  (11/30 0500)  Labs:  Basename 10/30/11 0629 10/29/11 0655 10/28/11 0630  HGB -- -- --  HCT -- -- --  PLT -- -- --  APTT -- -- --  LABPROT 28.0* 29.2* 25.9*  INR 2.57* 2.71* 2.32*  HEPARINUNFRC -- -- --  CREATININE -- -- --  CKTOTAL -- -- --  CKMB -- -- --  TROPONINI -- -- --   Estimated Creatinine Clearance: 38.8 ml/min (by C-G formula based on Cr of 0.71).  Medications:  Scheduled:     . donepezil  23 mg Oral QHS  . escitalopram  5 mg Oral Daily  . ferrous sulfate  325 mg Oral TID PC  . Flora-Q  1 capsule Oral Daily  . levothyroxine  75 mcg Oral Daily  . losartan  50 mg Oral Daily  . memantine  10 mg Oral BID  . metoprolol  25 mg Oral TID  . senna-docusate  2 tablet Oral QHS  . Vitamin D (Ergocalciferol)  50,000 Units Oral Q Wed  . warfarin  2.5 mg Oral ONCE-1800    Assessment:  87 YOF on chronic warfarin therapy for afib, s/p fall and left hip fracture and surgical repair. INR therapeutic.  No bleeding noted.   Goal of Therapy:  INR 2-3   Plan:  1.  Coumadin 5mg  PO today 2.  Daily INR for now  Phillips Climes Dien 10/30/2011,11:13 AM

## 2011-10-31 DIAGNOSIS — Z5189 Encounter for other specified aftercare: Secondary | ICD-10-CM

## 2011-10-31 DIAGNOSIS — I10 Essential (primary) hypertension: Secondary | ICD-10-CM

## 2011-10-31 DIAGNOSIS — W010XXA Fall on same level from slipping, tripping and stumbling without subsequent striking against object, initial encounter: Secondary | ICD-10-CM

## 2011-10-31 DIAGNOSIS — S72009A Fracture of unspecified part of neck of unspecified femur, initial encounter for closed fracture: Secondary | ICD-10-CM

## 2011-10-31 LAB — PROTIME-INR
INR: 2.9 — ABNORMAL HIGH (ref 0.00–1.49)
Prothrombin Time: 30.8 seconds — ABNORMAL HIGH (ref 11.6–15.2)

## 2011-10-31 MED ORDER — WARFARIN SODIUM 2.5 MG PO TABS
2.5000 mg | ORAL_TABLET | Freq: Once | ORAL | Status: AC
  Administered 2011-10-31: 2.5 mg via ORAL
  Filled 2011-10-31: qty 1

## 2011-10-31 NOTE — Progress Notes (Addendum)
Physical Therapy Session Note  Patient Details  Name: Tasha Ball MRN: [id number] Date of Birth: [birth year]  Today's Date: 10/31/2011 Time: 1300-1400 Time Calculation (min): 60 min  Precautions: Precautions Precautions: Posterior Hip Required Braces or Orthoses: No Restrictions Weight Bearing Restrictions: Yes LUE Weight Bearing: Weight bearing as tolerated LLE Weight Bearing: Weight bearing as tolerated  Short Term Goals: PT Short Term Goal 1: =LTGS  Skilled Therapeutic Interventions/Progress Updates: Tx focused on there ex for strengthening, per HEP as well as supine ther ex. Pt able to tolerate all ex with good technique, but required cues for technique and was fatigued by mid-tx. Also performed gait in controlled and community environment with RW as well as standing balance in kitchen. Pt still has minimal carry-over of safety and hand placement, but was able to recall 3/3 precautions with the handout.       Pain Pain Assessment Pain Assessment: No/denies pain Mobility Bed Mobility Bed Mobility: Yes Supine to Sit: 5: Supervision;HOB flat Supine to Sit Details: Verbal cues for technique (Cues for not reaching for person) Sit to Supine - Left: 5: Supervision Sit to Supine - Left Details: Verbal cues for precautions/safety Transfers Transfers: Yes Sit to Stand: 5: Supervision;With upper extremity assist;From bed Sit to Stand Details: Verbal cues for safe use of DME/AE;Verbal cues for precautions/safety;Verbal cues for technique Sit to Stand Details (indicate cue type and reason): Stand-step transfer: cues to not sit prematurely Stand to Sit: 5: Supervision Stand to Sit Details (indicate cue type and reason): Verbal cues for precautions/safety Locomotion  Ambulation Ambulation: Yes Ambulation/Gait Assistance: 5: Supervision Ambulation Distance (Feet): 120 Feet (120' in controlled environment, 50' outdoors on sidewalk) Assistive device: Rolling walker Ambulation/Gait  Assistance Details: Verbal cues for gait pattern;Verbal cues for safe use of DME/AE Gait Gait: Yes Gait Pattern: Impaired Gait Pattern: Step-through pattern;Decreased stride length;Trunk flexed (Pt looks down habitually, per son, cues to look up)   Exercises General Exercises - Lower Extremity Ankle Circles/Pumps: AROM;Both;20 reps;Supine Long Arc Quad: AROM;Both;10 reps;Seated;Weights (3#) Heel Slides: AROM;Left;20 reps;Supine Hip ABduction/ADduction: AROM;Left;15 reps;Supine Toe Raises: AROM;Both;10 reps;Standing Heel Raises: AROM;Strengthening;Both;10 reps;Standing Mini-Sqauts: AROM;Strengthening;Both;10 reps;Standing Standing Knee Flexion: AROM;Strengthening;Both;10 reps;Weights (3#) Total Joint Exercises Bridges: AROM;Both;10 reps;Supine  Other Treatments Treatments Therapeutic Activity: Performed dynamic standing balance in kitchen environment: reaching, carrying, placing objects with S, adhering to THP with bil/single UE support  Therapy/Group: Individual Therapy  Iona Coach, PT 10/31/2011, 2:09 PM

## 2011-10-31 NOTE — Progress Notes (Signed)
Patient ID: Tasha Ball, female   DOB: [birth year], 77 y.o.   MRN: [id number] Subjective: No new problems. No pain.   Objective: Vital signs in last 24 hours: Temp:  [97.5 F (36.4 C)-97.6 F (36.4 C)] 97.5 F (36.4 C) (12/01 0549) Pulse Rate:  [76-97] 97  (12/01 0549) Resp:  [15-18] 18  (12/01 0549) BP: (110-126)/(69-75) 126/69 mmHg (12/01 0549) SpO2:  [96 %] 96 % (12/01 0549) Weight change:  Last BM Date: 11/28/11  Intake/Output from previous day: 11/30 0701 - 12/01 0700 In: 720 [P.O.:720] Out: -  Last cbgs: CBG (last 3)  No results found for this basename: GLUCAP:3 in the last 72 hours   Physical Exam General: No apparent distress    Lungs: Normal effort. Lungs clear to auscultation, no crackles or wheezes. Cardiovascular: Regular rate and rhythm, no edema Musculoskeletal:  Neurovascularly intact Neurological: No new neurological deficits Wounds: Clean, dry, intact. No signs of infection.   Lab Results: No results found for this basename: WBC:2,HGB:2,HCT:2,PLT:2 in the last 72 hours BMET No results found for this basename: NA:2,K:2,CL:2,CO2:2,GLUCOSE:2,BUN:2,CREATININE:2,CALCIUM:2 in the last 72 hours  Studies/Results: No results found.  Medications: I have reviewed the patient's current medications.  Assessment/Plan: Patient Active Hospital Problem List: Femoral fracture    Assessment: Stable   Plan: Continue current therapies Atrial fibrillation    Assessment: stable   Plan: continue coumadin HTN (hypertension)   Assessment: Stable   Plan: The current medical regimen is effective;  continue present plan and medications.     Lang Snow , PA-C 10/31/2011, 10:36 AM  Patient personally seen and examined by me. Agree as here.  Rene Paci, MD 11:46 AM

## 2011-10-31 NOTE — Progress Notes (Signed)
ANTICOAGULATION CONSULT NOTE - Follow Up Consult  Pharmacy Consult for Coumadin Indication: history of Afib/VTE prophylaxis following left hip fracture and repair  No Known Allergies  Patient Measurements: Height: 5\' 1"  (154.9 cm) Weight: 107 lb 14.4 oz (48.943 kg) IBW/kg (Calculated) : 47.8   Vital Signs: Temp: 97.5 F (36.4 C) (12/01 0549) Temp src: Oral (12/01 0549) BP: 126/69 mmHg (12/01 0549) Pulse Rate: 97  (12/01 0549)  Labs:  Basename 10/31/11 1100 10/30/11 0629 10/29/11 0655  HGB -- -- --  HCT -- -- --  PLT -- -- --  APTT -- -- --  LABPROT 30.8* 28.0* 29.2*  INR 2.90* 2.57* 2.71*  HEPARINUNFRC -- -- --  CREATININE -- -- --  CKTOTAL -- -- --  CKMB -- -- --  TROPONINI -- -- --   Estimated Creatinine Clearance: 38.8 ml/min (by C-G formula based on Cr of 0.71).  Assessment:  87 YOF on chronic warfarin therapy for afib, s/p fall and left hip fracture and surgical repair. INR therapeutic, but decent rise INR overnight. No bleeding noted.   Goal of Therapy:  INR 2-3   Plan:  1. Coumadin 2.5mg  PO today  2. Daily INR for now, hope to decrease to TIW if remains therapeutic tom am.   Dannielle Huh 10/31/2011,12:30 PM

## 2011-10-31 NOTE — Progress Notes (Signed)
Occupational Therapy Session Note  Patient Details  Name: DEETTA SIEGMANN MRN: [id number] Date of Birth: 01-18-1924  Today's Date: 10/31/2011 Time: 1400-1430 Time Calculation (min): 30 min  Precautions: Precautions Precautions: Posterior Hip Required Braces or Orthoses: No Restrictions Weight Bearing Restrictions: Yes LUE Weight Bearing: Weight bearing as tolerated LLE Weight Bearing: Weight bearing as tolerated  Short Term Goals: OT Short Term Goal 1: Short Term Goals = Long Term Goals  Skilled Therapeutic Interventions/Progress Updates:    am session: self care in w/c at sink with focus on using AE to adhere to Total Hip Precaut.  Patient required min vcs and mod phys assist for AE  Pm session:  1400-1430   Patient c/o fatigue and elected to complete UE therexercise bed level to increase strength and overall conditioning    Pain none in am and then "medium amount";  Called RN for pain meds   Exercises  Other Treatments   Therapy/Group: Individual Therapy  Bud Face [facility] 10/31/2011, 3:36 PM

## 2011-10-31 NOTE — Progress Notes (Signed)
Physical Therapy Session Note  Patient Details  Name: Tasha Ball MRN: [id number] Date of Birth: Apr 07, 1924  Today's Date: 10/31/2011 Time: 1478-2956 Time Calculation (min): 47 min  Precautions: Precautions Precautions: Posterior Hip Required Braces or Orthoses: No Restrictions Weight Bearing Restrictions: Yes LUE Weight Bearing: Weight bearing as tolerated LLE Weight Bearing: Weight bearing as tolerated  Short Term Goals: PT Short Term Goal 1: =LTGS Pain = no c/o pain  Skilled Therapeutic Interventions/Progress Updates: Tx focused family ed with son present (very supportive), including gait, hip precautions, stairs, and car transfers.  Pt continues to need constant verbal cues for safety and hand placement, and son understands the need for this carry-over at home. Although pt can only recall 1/3 THP, she adheres to them with mobility.  Pt performed supine<>sit with flat HOB with S and cues for positioning in bed.  Sit<>stand x8 from various surfaces with S and constant cues for hand placement, no carry over between trials.   Gait 1x150' with RW and S in controlled environment with good RW management and posture. In obstacle course, pt required Min A for steadying in RW for turns and step-over objects.  Up/down 15 stairs with 1 rail and 1HHA with cues for step sequence. Son able to return-demo safely.  Educated pt/family on car transfer with S and cues for technique.  Explained hip precautions to son and how they incorporate during the day and with mobility.      Exercises: Nu step x5 min, level 4, 30 spm avg for LE strengthening.      Therapy/Group: Individual Therapy  Iona Coach, PT 10/31/2011, 12:45 PM

## 2011-10-31 NOTE — Progress Notes (Signed)
Patient alert, oriented with history of dementia. Very pleasant. Walker to ambulate to bathroom with supervision and cues. Last bowel movement today. Continent of bowel and bladder. Left hip fracture with steri's. Bed alarm when in bed and quick release belt when in wheelchair.

## 2011-11-01 LAB — PROTIME-INR: Prothrombin Time: 32 seconds — ABNORMAL HIGH (ref 11.6–15.2)

## 2011-11-01 MED ORDER — WARFARIN SODIUM 2.5 MG PO TABS
2.5000 mg | ORAL_TABLET | Freq: Once | ORAL | Status: AC
  Administered 2011-11-01: 2.5 mg via ORAL
  Filled 2011-11-01: qty 1

## 2011-11-01 NOTE — Progress Notes (Addendum)
Patient  Very pleasant and cooperative with staff and treatments. No compalints of pain today, but asked for medication to "ease her mind." Tylenol 650mg  was given @ 1141, patient reported feeling better after an hour. No unsafe issues noted, and uses call bell appropriately. Attempted to have a BM this am and produced a very scant amount in the bedside commode. Otherwise no other complaints. Continue with plan of care. Tasha Ball, Phill Mutter  Patient also to recvieve lopressor 25mg  this afternoon, manual BP taken and it was 90/62, So I did not give  Medication. MD notified and made aware. Patient is asymptomatic and has no other complaints. Tasha Ball, Phill Mutter

## 2011-11-01 NOTE — Progress Notes (Signed)
ANTICOAGULATION CONSULT NOTE - Follow Up Consult  Pharmacy Consult for Coumadin  Indication: history of Afib/VTE prophylaxis following left hip fracture and repair  No Known Allergies  Patient Measurements: Height: 5\' 1"  (154.9 cm) Weight: 107 lb 14.4 oz (48.943 kg) IBW/kg (Calculated) : 47.8   Vital Signs: Temp: 97.9 F (36.6 C) (12/02 0626) Temp src: Oral (12/02 0626) BP: 116/79 mmHg (12/02 0626) Pulse Rate: 74  (12/02 0626)  Labs:  Basename 11/01/11 0630 10/31/11 1100 10/30/11 0629  HGB -- -- --  HCT -- -- --  PLT -- -- --  APTT -- -- --  LABPROT 32.0* 30.8* 28.0*  INR 3.05* 2.90* 2.57*  HEPARINUNFRC -- -- --  CREATININE -- -- --  CKTOTAL -- -- --  CKMB -- -- --  TROPONINI -- -- --   Estimated Creatinine Clearance: 38.8 ml/min (by C-G formula based on Cr of 0.71).   Medications:  Scheduled:    . donepezil  23 mg Oral QHS  . escitalopram  5 mg Oral Daily  . ferrous sulfate  325 mg Oral TID PC  . Flora-Q  1 capsule Oral Daily  . levothyroxine  75 mcg Oral Daily  . losartan  50 mg Oral Daily  . memantine  10 mg Oral BID  . metoprolol  25 mg Oral TID  . senna-docusate  2 tablet Oral QHS  . Vitamin D (Ergocalciferol)  50,000 Units Oral Q Wed  . warfarin  2.5 mg Oral ONCE-1800   Assessment:  87 YOF on chronic warfarin therapy for afib, s/p fall and left hip fracture and surgical repair. INR upper-end  Therapeutic goal. Expect INR to fall with 2.5mg  dose given last pm base on previous dosing and trends.    Goal of Therapy:  INR 2-3  Plan:  1. Coumadin 2.5mg  PO tonight 2. Looking to change INR to MWF if does not trend up in am.     Dannielle Huh 11/01/2011,1:11 PM

## 2011-11-01 NOTE — Progress Notes (Addendum)
Patient ID: [name], female   DOB: [birth year], 77 y.o.   MRN: [id number] Subjective: No new problems. No pain. Looking forward to going home this week.   Objective: Vital signs in last 24 hours: Temp:  [97.9 F (36.6 C)-98.1 F (36.7 C)] 97.9 F (36.6 C) (12/02 0626) Pulse Rate:  [74-94] 74  (12/02 0626) Resp:  [15-20] 15  (12/02 0626) BP: (104-116)/(70-79) 116/79 mmHg (12/02 0626) SpO2:  [92 %-95 %] 95 % (12/02 0626) Weight change:  Last BM Date: 10/31/11  Intake/Output from previous day: 12/01 0701 - 12/02 0700 In: 600 [P.O.:600] Out: -  Last cbgs: CBG (last 3)  No results found for this basename: GLUCAP:3 in the last 72 hours   Physical Exam General: No apparent distress    Lungs: Normal effort. Lungs clear to auscultation, no crackles or wheezes. Cardiovascular: Regular rate and rhythm, no edema Musculoskeletal:  Neurovascularly intact Neurological: No new neurological deficits Wounds: Clean, dry, intact. No signs of infection.   Lab Results: No results found for this basename: WBC:2,HGB:2,HCT:2,PLT:2 in the last 72 hours BMET No results found for this basename: NA:2,K:2,CL:2,CO2:2,GLUCOSE:2,BUN:2,CREATININE:2,CALCIUM:2 in the last 72 hours  Studies/Results: No results found.  Medications: I have reviewed the patient's current medications.  Assessment/Plan: Patient Active Hospital Problem List: Femoral fracture    Assessment: Stable   Plan: Continue current therapies Atrial fibrillation    Assessment: stable   Plan: continue coumadin HTN (hypertension)   Assessment: Stable   Plan: The current medical regimen is effective;  continue present plan and medications.     Lang Snow , PA-C 11/01/2011, 9:59 AM   Patient personally seen and examined by me. Agree as here.  Rene Paci, MD 10:10 AM

## 2011-11-01 NOTE — Progress Notes (Signed)
Occupational Therapy Session Note  Patient Details  Name: Tasha Ball MRN: [id number] Date of Birth: 04-05-1924  Today's Date: 11/01/2011 Time: 4098-1191 Time Calculation (min): 60 min  Precautions: Precautions Precautions: Posterior Hip Required Braces or Orthoses: No Restrictions Weight Bearing Restrictions: Yes LUE Weight Bearing: Weight bearing as tolerated LLE Weight Bearing: Weight bearing as tolerated  Short Term Goals: OT Short Term Goal 1: Short Term Goals = Long Term Goals  Skilled Therapeutic Interventions/Progress Updates: ADL in w/c at sink with focus on adhering to THPs (patient demonstrated compliance) using AE patient with moderate difficulty and memory deficits to carryover demonstration.   Will most likely needs assist upon d/c.    Pain none  Therapy/Group: Individual Therapy  Bud Face [facility] 11/01/2011, 5:23 PM

## 2011-11-02 DIAGNOSIS — I10 Essential (primary) hypertension: Secondary | ICD-10-CM

## 2011-11-02 DIAGNOSIS — W010XXA Fall on same level from slipping, tripping and stumbling without subsequent striking against object, initial encounter: Secondary | ICD-10-CM

## 2011-11-02 DIAGNOSIS — S72009A Fracture of unspecified part of neck of unspecified femur, initial encounter for closed fracture: Secondary | ICD-10-CM

## 2011-11-02 DIAGNOSIS — Z5189 Encounter for other specified aftercare: Secondary | ICD-10-CM

## 2011-11-02 MED ORDER — FLORA-Q PO CAPS: 1.0000 | ORAL_CAPSULE | Freq: Every day | ORAL | Status: DC

## 2011-11-02 MED ORDER — WARFARIN SODIUM 2.5 MG PO TABS
2.5000 mg | ORAL_TABLET | Freq: Once | ORAL | Status: AC
  Administered 2011-11-02: 2.5 mg via ORAL
  Filled 2011-11-02: qty 1

## 2011-11-02 MED ORDER — FERROUS SULFATE 325 (65 FE) MG PO TABS: 325.0000 mg | ORAL_TABLET | Freq: Three times a day (TID) | ORAL | Status: DC

## 2011-11-02 MED ORDER — TRAMADOL-ACETAMINOPHEN 37.5-325 MG PO TABS: 1.0000 | ORAL_TABLET | Freq: Four times a day (QID) | ORAL | Status: AC | PRN

## 2011-11-02 MED ORDER — METOPROLOL TARTRATE 25 MG PO TABS: 25.0000 mg | ORAL_TABLET | Freq: Three times a day (TID) | ORAL | Status: DC

## 2011-11-02 MED ORDER — WARFARIN SODIUM 2.5 MG PO TABS: 2.5000 mg | ORAL_TABLET | Freq: Every day | ORAL | Status: DC

## 2011-11-02 NOTE — Progress Notes (Signed)
Physical Therapy Discharge Summary  Patient Details  Name: Tasha Ball MRN: [id number] Date of Birth: 1924/06/28 Today's Date: 11/02/2011  Treatment#1: [id number] individual therapy. No c/o pain. Treatment session focused on safety with RW and functional mobility training through obstacle course with RW overall supervision requiring mod/max cueing for safety. Stair training with close S up/down 13 steps with bilateral handrail for general strengthening. Functional strengthening and dynamic standing balance without UE support to reach for objects and place them overhead onto basketball hoop close S. Repeated Berg Balance test Patient demonstrates increased fall risk as noted by score of 21  /56 on Berg Balance Scale.  (<36= high risk for falls, close to 100%; 37-45 significant >80%; 46-51 moderate >50%; 52-55 lower >25%). Pt with significant improvement from initial score of 6. Gait with RW in controlled environment x 150'  with close S, verbal cues for safety needed.   Treatment #2: 1400-1430 individual therapy. No c/o pain. Treatment session focused on family education with pt's daughter in law in regards to safety with mobility, positioning in the bed to maintain hip precautions, bed mobility, basic transfers, car transfer, gait, stairs and overall safety with mobility. Discussed in detail with daughter in law recommendations for safety with mobility ("stay close to RW.", "reach behind you before you sit down.", "push up from the chair to stand up first.", etc), where to stand for going up/down stairs. Recommend family members to provide steadyA for stairs due to pt sometimes having a posterior lean with mobility, daughter in law able to provide return demonstration safely at close S level/steady A. Return demo car transfer and cueing for appropriate bed mobility with daughter in law at supervision level.    Patient has met 8 of 8 long term goals due to improved activity tolerance, improved balance,  increased strength and ability to compensate for deficits.  Patient to discharge at an ambulatory level Supervision level with RW.   Patient's care partner is independent to provide the necessary physical and cognitive assistance at discharge. Family education was completed with pt's son and daughter in law in regards to all aspects of mobility.   Recommendation:  Patient will benefit from ongoing skilled PT services in home health setting to continue to advance safe functional mobility, address ongoing impairments in balance, gait, cognition, strength, activity tolerance, functional mobility, and minimize fall risk. Recommend pt with close 24/7 supervision due to decreased safety due to dementia and requiring cueing for safety with mobility and with RW.  Equipment: Equipment provided: 16 x16 w/c, RW  Patient/family agrees with progress made and goals achieved: Yes.   Tedd Sias

## 2011-11-02 NOTE — Discharge Summary (Signed)
NAMEMATHEW, STORCK                 ACCOUNT NO.:  [id number]  MEDICAL RECORD NO.:  [id number]  LOCATION:                                 FACILITY:  PHYSICIAN:  Ranelle Oyster, M.D.DATE OF BIRTH:  December 16, 1923  DATE OF ADMISSION:  10/21/2011 DATE OF DISCHARGE:  11/03/2011                              DISCHARGE SUMMARY   DISCHARGE DIAGNOSES: 1. Left femoral neck fracture with hip arthroplasty, October 18, 2011. 2. Chronic Coumadin for atrial fibrillation and pain management. 3. Methicillin-resistant Staphylococcus aureus with contact     precautions. 4. History of dementia. 5. Hypertension. 6. Anemia. 7. Hypothyroidism.  HISTORY OF PRESENT ILLNESS:  An 77 year old white female history of dementia, recent urinary tract infection who tripped and fell October 16, 2011, sustained an acute left femoral neck fracture.  The patient on chronic Coumadin for atrial fibrillation.  Evaluated by Dr. Durene Romans and underwent left hip hemiarthroplasty, October 18, 2011. Postoperatively with elevated heart rate, placed on intravenous Cardizem.  [facility] consulted, Lopressor increased to every 8 hours.  The patient was weightbearing as tolerated with posterior hip precautions.  Placed on contact precautions for nasal positive MRSA. She was total assist to ambulate 20 feet.  She was admitted for a comprehensive rehab program.  PAST MEDICAL HISTORY:  See discharge diagnoses.  ALLERGIES:  None.  SOCIAL HISTORY:  She lives with family being her son and daughter-in- law, 1-level home, 2-3 steps to entry.  FUNCTIONAL HISTORY PRIOR TO ADMISSION:  She was independent with basic activities of daily living and ambulation, needed some assistance for home making.  FUNCTIONAL STATUS ON ADMISSION TO REHAB SERVICES:  Total assist to ambulate 20 feet with verbal cues.  PHYSICAL EXAMINATION:  VITAL SIGNS:  Blood pressure 135/85, pulse 99, temperature 98.3, respirations 16. GENERAL:   This was an alert female, oriented to person, place, time. She appeared well-developed, normocephalic. CARDIAC:  Rate controlled. ABDOMEN:  Soft, nontender.  Good bowel sounds. LUNGS:  Clear to auscultation. EXTREMITIES:  Her left hip wound was clean, dry, and intact.  REHABILITATION HOSPITAL COURSE:  The patient was admitted to Inpatient Rehab Services with therapies initiated on a 3-hour daily basis consisting of physical therapy, occupational therapy, and rehabilitation nursing.  The following issues were addressed during the patient's rehabilitation stay.  Pertaining to Mrs. Summer's left femoral neck fracture, she had undergone hip hemiarthroplasty on October 18, 2011, per [name].  She was weightbearing as tolerated with posterior hip precautions.  Surgical site healing nicely, neurovascular sensation intact.  Pain management ongoing with the use of Ultracet as well as Robaxin with good pain control and close monitoring of mental status. She remained on chronic Coumadin therapy for atrial fibrillation, followed by Dr. Rodrigo Ran, latest INR of 3.05.  Blood pressure is well controlled on Cozaar and Lopressor with no orthostatic changes.  She remained on hormone supplement for hypothyroidism.  The patient received weekly collaborative interdisciplinary team conferences to discuss estimated length of stay, family teaching, and any barriers to discharge.  She was ambulating household distances with an assistive device.  Minimal assistance overall, needing some assistance for lower body dressing, needing some  cues to maintain her posterior hip precautions.  She was continent of bowel and bladder.  It was advised that overall need for supervision at home due to her history of dementia.  Ongoing therapies would be dictated as per [employer].  DISCHARGE MEDICATIONS: 1. Aricept 23 mg at bedtime. 2. Lexapro 5 mg daily. 3. Ferrous sulfate 325 mg 3 times daily. 4. Flora-Q 1 capsule  daily. 5. Synthroid 75 mcg daily. 6. Cozaar 50 mg daily. 7. Namenda 10 mg twice daily. 8. Lopressor 25 mg 3 times daily. 9. Senokot 2 tablets at bedtime. 10.Ultram 50 mg every 6 hours as needed for pain, dispensed  60     tablets. 11.Vitamin D 50,000 units every Wednesday. 12.Coumadin last dose of 2.5 mg daily adjusted accordingly for an INR     of 2.0-3.0.  DIET:  Regular.  SPECIAL INSTRUCTIONS:  Weightbearing as tolerated with posterior hip precautions.  The patient should follow up with Dr. Durene Romans, Orthopedic Service to call for appointment.  A home health nurse had been arranged for chronic Coumadin therapy with results to Dr. Rodrigo Ran who was following the patient prior to hospital admission.  She would follow up with Dr. Faith Rogue at the outpatient Rehab Service office as needed.     Mariam Dollar, P.A.   ______________________________ Ranelle Oyster, M.D.    DA/MEDQ  D:  11/02/2011  T:  11/02/2011  Job:  [id number]  cc:   Loraine Leriche A. Perini, M.D.

## 2011-11-02 NOTE — Progress Notes (Signed)
ANTICOAGULATION CONSULT NOTE - Follow Up Consult  Pharmacy Consult for Coumadin Indication: history of Afib/VTE prophylaxis following left hip fracture and repair  No Known Allergies  Patient Measurements: Height: 5\' 1"  (154.9 cm) Weight: 107 lb 14.4 oz (48.943 kg) IBW/kg (Calculated) : 47.8   Vital Signs: Temp: 98.2 F (36.8 C) (12/03 0532) Temp src: Oral (12/03 0532) BP: 121/80 mmHg (12/03 0532) Pulse Rate: 58  (12/03 0532)  Labs:  Basename 11/02/11 0630 11/01/11 0630 10/31/11 1100  HGB -- -- --  HCT -- -- --  PLT -- -- --  APTT -- -- --  LABPROT 31.5* 32.0* 30.8*  INR 2.99* 3.05* 2.90*  HEPARINUNFRC -- -- --  CREATININE -- -- --  CKTOTAL -- -- --  CKMB -- -- --  TROPONINI -- -- --   Estimated Creatinine Clearance: 38.8 ml/min (by C-G formula based on Cr of 0.71).  Assessment:  87 YOF on chronic warfarin therapy for afib, s/p fall and left hip fracture and surgical repair. INR therapeutic and decreased from yesterday. No bleeding noted.   Goal of Therapy:  INR 2-3   Plan:  1. Coumadin 2.5mg  PO today  2. Daily INR   Cherrell Maybee Poteet 11/02/2011,12:29 PM

## 2011-11-02 NOTE — Progress Notes (Signed)
Patient ID: Tasha Ball, female   DOB: 12/04/1923, 77 y.o.   MRN: [id number] Patient ID: Tasha Ball, female   DOB: [birth year], 77 y.o.   MRN: [id number] Subjective/Complaints: I may be going home tomorrow. Review of Systems  Musculoskeletal: Positive for joint pain.  All other systems reviewed and are negative.  --+   Objective: Vital Signs: Blood pressure 121/80, pulse 58, temperature 98.2 F (36.8 C), temperature source Oral, resp. rate 15, height 5\' 1"  (1.549 m), weight 48.943 kg (107 lb 14.4 oz), SpO2 95.00%. No results found. No results found for this basename: WBC:2,HGB:2,HCT:2,PLT:2 in the last 72 hours No results found for this basename: NA:2,K:2,CL:2,CO2:2,GLUCOSE:2,BUN:2,CREATININE:2,CALCIUM:2 in the last 72 hours CBG (last 3)  No results found for this basename: GLUCAP:3 in the last 72 hours  Wt Readings from Last 3 Encounters:  10/28/11 48.943 kg (107 lb 14.4 oz)  10/26/11 48.898 kg (107 lb 12.8 oz)  10/26/11 48.898 kg (107 lb 12.8 oz)    Physical Exam:  General appearance: alert, cooperative and no distress Head: Normocephalic, without obvious abnormality, atraumatic Eyes: conjunctivae/corneas clear. PERRL, EOM's intact. Fundi benign. Ears: normal TM's and external ear canals both ears Nose: Nares normal. Septum midline. Mucosa normal. No drainage or sinus tenderness. Throat: lips, mucosa, and tongue normal; teeth and gums normal Neck: no adenopathy, no carotid bruit, no JVD, supple, symmetrical, trachea midline and thyroid not enlarged, symmetric, no tenderness/mass/nodules Back: symmetric, no curvature. ROM normal. No CVA tenderness. Resp: clear to auscultation bilaterally Cardio: regular rate and rhythm, S1, S2 normal, no murmur, click, rub or gallop GI: soft, non-tender; bowel sounds normal; no masses,  no organomegaly Extremities: extremities normal, atraumatic, no cyanosis or edema Pulses: 2+ and symmetric Skin: Skin color, texture, turgor normal. No  rashes or lesions mild bruising at hip Neurologic: Grossly normal Incision/Wound: wound clean and intact   Assessment/Plan: 1. Functional deficits secondary to Left Femoral Neck Fx s/p arthroplasty which require 3+ hours per day of interdisciplinary therapy in a comprehensive inpatient rehab setting. Physiatrist is providing close team supervision and 24 hour management of active medical problems listed below. Physiatrist and rehab team continue to assess barriers to discharge/monitor patient progress toward functional and medical goals.  Pt is cooperative and enthusiastically working toward goals  Mobility: Bed Mobility Bed Mobility: Yes Supine to Sit: 5: Supervision;HOB flat Sit to Supine - Left: 5: Supervision Transfers Transfers: Yes Sit to Stand: 5: Supervision;With upper extremity assist;From bed Sit to Stand Details (indicate cue type and reason): Stand-step transfer: cues to not sit prematurely Stand to Sit: 5: Supervision Stand to Sit Details: written instruction and pictures on RW for THP reference to aid carry over Stand Pivot Transfers - DO NOT USE: 4: Min assist Ambulation/Gait Ambulation/Gait Assistance: 5: Supervision Ambulation/Gait Assistance Details (indicate cue type and reason): S to adhere to THP on turns and for safe RW use in household environement Ambulation Distance (Feet): 120 Feet (120' in controlled environment, 50' outdoors on sidewalk) Assistive device: Rolling walker Gait Pattern: Step-through pattern;Decreased stride length;Trunk flexed (Pt looks down habitually, per son, cues to look up) Gait velocity: 10 meter walk test 20 seconds (increased fall risk > 18.2 ft/sec) Stairs: Yes Stairs Assistance: 4: Min assist Stair Management Technique: One rail Right;Two rails Number of Stairs: 10  Height of Stairs: 6  Wheelchair Mobility Wheelchair Mobility: No ADL:   Cognition: Cognition Overall Cognitive Status: Impaired at baseline Arousal/Alertness:  Awake/alert Orientation Level: Oriented to person;Oriented to place;Oriented to time Memory: Impaired Memory  Impairment: Decreased short term memory Awareness: Impaired Awareness Impairment: Anticipatory impairment Safety/Judgment: Impaired Comments: cueing needed for safety with mobility Cognition Arousal/Alertness: Awake/alert Orientation Level: Oriented to person;Oriented to place;Oriented to time   Medical Problem List and Plan:  1.Left femoral neck fracture-s/p hip arthroplasty 11/18.WBAT/posterior hip precautions.PT/OT eval and treat. Participating well with therapies. Carryover adequate.  1. DVT Prophylaxis/Anticoagulation: chronic coumadin for A FIB.Monitor platelets and any excessive bleeding.Warfarin protocol  2. Pain Management: ultracet/robaxin.Monitor mental status. Pain well controlled.  3.MRSA-contact precautions.   4.Dementia-aricept/namenda.Monitor safety. Doing surprisingly well at this time with only mild short term memory deficits.  5.HTN/HR.cozaar,lopressor.Monitor with increased activity and pain. heart rate currently controlled.   6.Mood-lexapro.ego support. Mood appears fairly positive at present.   7.Anemia-iron supplement. Hgb has been trending up.    8. Leukocytosis improved. Urinalysis +/-.  Culture negative  8.Hypothyrodism-synthroid    LOS (Days) 7  Tasha Ball 11/02/2011, 7:27 AM

## 2011-11-02 NOTE — Progress Notes (Signed)
Social Work Patient ID: [name], female   DOB: 03-Aug-1924, 77 y.o.   MRN: [id number]  Met with pt and spoke with son via telephone to discuss discharge needs. Pt needs a youth rolling walker and rental wheelchair along with a 3 in1. Pref [facility] to provide, Genevieve Norlander is already following for Home Health. Family education done over the weekend, ready for discharge tomorrow.  Son aware Pt needs close supervision due to safety issues.

## 2011-11-02 NOTE — Progress Notes (Signed)
Occupational Therapy Discharge Summary and Session Notes  Patient Details  Name: Tasha Ball MRN: [id number] Date of Birth: 01-04-1924 Today's Date: 11/02/2011  Session Notes  Session One 1191-4782 - 55 Minutes Individual Therapy No complaints of pain  Engaged in ADL retraining at shower level. Focused skilled intervention on functional ambulation/mobility around room using rolling walker, UB/LB bathing in shower in sit to stand position on tub transfer bench, UB/LB dressing in sit to stand position in w/c using AE prn (patient required mod verbal cues to use AE appropriately and to adhere to hip precautions), dynamic standing balance/tolerance/endurance at sink for grooming tasks. Initially, patient sat EOB for UB/LB dressing; however secondary to patient with posterior lean and unable to self correct had patient sit in w/c for supported sitting during dressing. Recommend patient sit in w/c or supported chair for UB/LB dressing and functional seated tasks and recommend reacher and sock aid to increase independence with ADLs.   Session Two 1300-1400 - 60 minutes Individual Therapy No complaints of pain  Skilled therapy to focus on functional mobility and education/training with daughter in law regarding functional mobility, aspects of daily living, safety with rolling walker, overall safety with transfers and dynamic standing. Recommended hip kit (with reacher, long handled shoe horn, long handled brush, & sock aid), hand held shower head, BSC, and shower seat with back to increase overall independence with ADLs. Also discussed recommendation of patient sitting in supported seat for dressing. Daughter in law agreed with progress made and goals. Daughter in law also agreeable to 24/7 supervision. Explained and demonstrated all items in hip kit with daughter in law so mod verbal cues prn can be beneficial for patient.    Patient has met 7 of 7 long term goals due to improved activity tolerance,  improved balance, postural control and ability to compensate for deficits.  Patient will require 24/7 supervision at discharge secondary to poor overall safety awareness, poor safety with rolling walker, decreased short term memory, and patient not able to adhere to hip precautions independently (without mod verbal cues). Patient was educated on adaptive equipment; however needs mod verbal cues to appropriately use equipment. Daughter in law present for education training. See note above for information regarding education provided.    See FIM and Discharge Navigator for objective & subjective measures.   Recommendation:  Patient will continue to received OT services in a home health setting to continue to advance functional skills in the area of ADL and iADL.  Equipment: Equipment provided: BSC/3-in-1 and shower seat with back  Patient/family agrees with progress made and goals achieved: Yes  [name] 11/02/2011, 11:30 AM

## 2011-11-02 NOTE — Progress Notes (Signed)
Per State Regulation 482.30 This chart was reviewed for medical necessity with respect to the patient's Admission/Duration of stay. Pt has met goals. Family ed complete. No barriers to d/c tomorrow noted.  Meryl Dare                 Nurse Care Manager              Next Review Date:

## 2011-11-03 NOTE — Progress Notes (Signed)
Patient ID: [name], female   DOB: May 27, 1924, 77 y.o.   MRN: [id number] Patient ID: [name], female   DOB: 02/23/1924, 77 y.o.   MRN: [id number] Patient ID: [name], female   DOB: Feb 07, 1924, 77 y.o.   MRN: [id number] Subjective/Complaints: Doing well.  Excited to go home. Review of Systems  Musculoskeletal: Positive for joint pain.  All other systems reviewed and are negative.  --+   Objective: Vital Signs: Blood pressure 121/75, pulse 68, temperature 97.7 F (36.5 C), temperature source Oral, resp. rate 18, height 5\' 1"  (1.549 m), weight 48.943 kg (107 lb 14.4 oz), SpO2 94.00%. No results found. No results found for this basename: WBC:2,HGB:2,HCT:2,PLT:2 in the last 72 hours No results found for this basename: NA:2,K:2,CL:2,CO2:2,GLUCOSE:2,BUN:2,CREATININE:2,CALCIUM:2 in the last 72 hours CBG (last 3)  No results found for this basename: GLUCAP:3 in the last 72 hours  Wt Readings from Last 3 Encounters:  10/28/11 48.943 kg (107 lb 14.4 oz)  10/26/11 48.898 kg (107 lb 12.8 oz)  10/26/11 48.898 kg (107 lb 12.8 oz)    Physical Exam:  General appearance: alert, cooperative and no distress Head: Normocephalic, without obvious abnormality, atraumatic Eyes: conjunctivae/corneas clear. PERRL, EOM's intact. Fundi benign. Ears: normal TM's and external ear canals both ears Nose: Nares normal. Septum midline. Mucosa normal. No drainage or sinus tenderness. Throat: lips, mucosa, and tongue normal; teeth and gums normal Neck: no adenopathy, no carotid bruit, no JVD, supple, symmetrical, trachea midline and thyroid not enlarged, symmetric, no tenderness/mass/nodules Back: symmetric, no curvature. ROM normal. No CVA tenderness. Resp: clear to auscultation bilaterally Cardio: regular rate and rhythm, S1, S2 normal, no murmur, click, rub or gallop GI: soft, non-tender; bowel sounds normal; no masses,  no organomegaly Extremities: extremities normal, atraumatic, no cyanosis or  edema Pulses: 2+ and symmetric Skin: Skin color, texture, turgor normal. No rashes or lesions mild bruising at hip Neurologic: Grossly normal Incision/Wound: wound clean and intact   Assessment/Plan: 1. Functional deficits secondary to Left Femoral Neck Fx s/p arthroplasty which require 3+ hours per day of interdisciplinary therapy in a comprehensive inpatient rehab setting. Physiatrist is providing close team supervision and 24 hour management of active medical problems listed below. Physiatrist and rehab team continue to assess barriers to discharge/monitor patient progress toward functional and medical goals.  Goals met!  Mobility: Bed Mobility Bed Mobility: Yes Supine to Sit: 5: Supervision Sit to Supine - Left: 5: Supervision Transfers Transfers: Yes Sit to Stand: 5: Supervision Sit to Stand Details (indicate cue type and reason): Stand-step transfer: cues to not sit prematurely Stand to Sit: 5: Supervision Stand to Sit Details: written instruction and pictures on RW for THP reference to aid carry over Stand Pivot Transfers - DO NOT USE: 4: Min assist Ambulation/Gait Ambulation/Gait Assistance: 5: Supervision Ambulation/Gait Assistance Details (indicate cue type and reason): S to adhere to THP on turns and for safe RW use in household environement Ambulation Distance (Feet): 150 Feet Assistive device: Rolling walker Gait Pattern: Step-through pattern;Decreased stride length;Trunk flexed (Pt looks down habitually, per son, cues to look up) Gait velocity: 10 meter walk test 20 seconds (increased fall risk > 18.2 ft/sec) Stairs: Yes Stairs Assistance: 5: Supervision Stair Management Technique: One rail Right;Two rails Number of Stairs: 13  Height of Stairs: 6  Wheelchair Mobility Wheelchair Mobility: No ADL:   Cognition: Cognition Overall Cognitive Status: Impaired at baseline Arousal/Alertness: Awake/alert Orientation Level: Oriented to person;Oriented to  place Memory: Impaired Memory Impairment: Decreased short term memory;Decreased  recall of new information Awareness: Impaired Awareness Impairment: Anticipatory impairment Safety/Judgment: Impaired Comments: cueing needed for safety with mobility Cognition Arousal/Alertness: Awake/alert Orientation Level: Oriented to person;Oriented to place   Medical Problem List and Plan:  1.Left femoral neck fracture-s/p hip arthroplasty 11/18.WBAT/posterior hip precautions. D/C home today!  1. DVT Prophylaxis/Anticoagulation: chronic coumadin for A FIB.Monitor platelets and any excessive bleeding.Warfarin protocol  2. Pain Management: ultracet/robaxin.Monitor mental status. Pain well controlled.  3.MRSA-contact precautions.   4.Dementia-aricept/namenda.Monitor safety. Doing surprisingly well at this time with only mild short term memory deficits.  5.HTN/HR.cozaar,lopressor.Monitor with increased activity and pain. heart rate currently controlled.   6.Mood-lexapro.ego support. Mood appears fairly positive at present.   7.Anemia-iron supplement. Hgb has been trending up.    8. Leukocytosis resolved.  8.Hypothyrodism-synthroid    LOS (Days) 8  SWARTZ,ZACHARY T 11/03/2011, 7:56 AM

## 2011-11-03 NOTE — Progress Notes (Signed)
Patient discharged home with her son. PA reviewed discharge instruction with patient and her son and they verbalized understanding. Patient left via wheelchair transport in satisfactory condition.

## 2011-11-03 NOTE — Progress Notes (Signed)
Social Work Discharge Note Discharge Note  The overall goal for the admission was met for:   Discharge location: Yes-HOME WITH SON AND DTR IN-LAW, AWARE OF 24 HR CLOSE SUPERVISION Length of Stay: Yes-8 DAYS  Discharge activity level: Yes-CL0SE SUPERVISION  Home/community participation: Yes  Services provided included: MD, RD, PT, OT, RN, CM, TR, Pharmacy and SW  Financial Services: Private Insurance: BLUE MEDICARE  Follow-up services arranged: Home Health: [facility]-PT,OT,RN  Comments (or additional information):[facility]-WHEELCHAIR, YOUTH ROLLING WALKER, 3 IN1, TUBSEAT DISCUSSED LONG TERM CARE POLICY WITH DTR IN-LAW SHE AND HUSBAND TO PURSUE. AWARE OF PRIVATE DUTY RESOURCES Patient/Family verbalized understanding of follow-up arrangements: Yes  Individual responsible for coordination of the follow-up plan: SUSAN AND LEE-DTR IN-LAW AND SON  Confirmed correct DME delivered: Lucy Chris 11/03/2011    Lucy Chris

## 2011-11-03 NOTE — Progress Notes (Signed)
Patient ID: Tasha Ball, female   DOB: 04/11/1924, 77 y.o.   MRN: [id number] Clinical update faxed to [facility] at [facility].

## 2011-11-08 ENCOUNTER — Telehealth: Payer: Self-pay | Admitting: Internal Medicine

## 2011-11-08 NOTE — Telephone Encounter (Signed)
Contacted by Mrs. Tasha Ball son, Tasha Ball by phone today. Tasha Ball was recently in the hospital after hip fracture status post partial hip replacement. The best I can tell, she has not seen by anyone in our office, and Tasha Ball states he may have called the wrong number. I reviewed her records, including a past medical history notable for atrial fibrillation on warfarin, recent hip fracture status post partial replacement, and UTIs. She is seen by Tasha Ball.  Her son reports, since being discharged from the hospital 3 or 4 days ago she has had urinary frequency and constipation. Both he and her caregiver feel that constipation may be related to pain medications taken for her recent hip surgery. She has used the pain medication only twice according to her son.  He states that today after passing a small, hard, brown stool that noticed a small amount, "smear", of red blood in her underwear. There was no report of blood in the toilet. No report of dark black stool, and no report of diarrhea.  I spoke to Tasha Ball, her home health aid, and her frequent trips to the bathroom have been mostly for urination.  This sounds possibly like an anorectal source, such as hemorrhoids.  However this cannot be determined by phone.  We discussed, how if she has further bleeding, diarrhea, melena, shortness of breath, chest pain, dizziness, weakness or other presyncopal symptoms the best method for evaluation today is the ED.  He voiced understanding.  I have advised that he contact Tasha Ball in the morning to let him know her current status/update on condition.  Time provided for questions and answers, and he thanked me for the call.  Note to be faxed to Tasha Ball office in the am.

## 2012-01-22 ENCOUNTER — Other Ambulatory Visit (HOSPITAL_COMMUNITY): Payer: Self-pay | Admitting: *Deleted

## 2012-01-26 ENCOUNTER — Ambulatory Visit (HOSPITAL_COMMUNITY): Admission: RE | Admit: 2012-01-26 | Payer: Medicare Other | Source: Ambulatory Visit

## 2012-04-30 ENCOUNTER — Other Ambulatory Visit: Payer: Self-pay | Admitting: Hematology & Oncology

## 2012-05-02 ENCOUNTER — Other Ambulatory Visit: Payer: Self-pay | Admitting: *Deleted

## 2012-05-03 ENCOUNTER — Other Ambulatory Visit: Payer: Self-pay | Admitting: *Deleted

## 2012-06-15 ENCOUNTER — Other Ambulatory Visit: Payer: Self-pay | Admitting: Internal Medicine

## 2012-06-15 DIAGNOSIS — Z1231 Encounter for screening mammogram for malignant neoplasm of breast: Secondary | ICD-10-CM

## 2012-07-12 ENCOUNTER — Ambulatory Visit: Payer: Medicare Other

## 2012-07-13 ENCOUNTER — Ambulatory Visit (HOSPITAL_COMMUNITY)
Admission: RE | Admit: 2012-07-13 | Discharge: 2012-07-13 | Disposition: A | Discharge status: Home / Self Care | Payer: Medicare Other | Source: Ambulatory Visit | Attending: Internal Medicine | Admitting: Internal Medicine

## 2012-07-13 DIAGNOSIS — R1310 Dysphagia, unspecified: Secondary | ICD-10-CM

## 2012-07-13 NOTE — Procedures (Signed)
Objective Swallowing Evaluation: Modified Barium Swallowing Study  Patient Details  Name: Tasha Ball MRN: [id number] Date of Birth: 27-Jan-1924  Today's Date: 07/13/2012 Time: 3086-5784 SLP Time Calculation (min): 39 min  Past Medical History:  Past Medical History  Diagnosis Date  . Urinary tract infection   . Cancer     non-hodgkins lymphoma   . Lymphoma in remission     ca free 5 yrs  . Dementia   . Atrial fibrillation   . Hypertension   . Mitral valve prolapse    Past Surgical History:  Past Surgical History  Procedure Date  . Eye surgery     bil.cataract with lens implants  . Hip arthroplasty 10/18/2011    Procedure: ARTHROPLASTY BIPOLAR HIP;  Surgeon: [name];  Location: [facility];  Service: Orthopedics;  Laterality: Left;  depuy   HPI:  Pt referred by Dr Waynard Edwards for MBS due to concerns pt may be aspirating.  Pt is a resident of [facility].  Son Nedra Hai present during testing and reports concern with pt's recent coughing, congestion and hoarseness- this has largely resolved per son .  PMH + for dementia, mild GERD, hiatal hernia, Non-Hodgkin's lymphoma (Lt tonsillar region) s/p surgery and chemo - no radiation, hearing loss, UTI, Afib, Hip fx 11/12, HTN, hypothyroidism, anxiety, AR seasonal, afib, syndrome of inappropriate antidiuretic hormone secretion, whooping cough as a child.  Head CT 10/16/11 atrophy, chronic microvascular ischemia.  Pt denies problems with dysphagia  but son states she coughs more with liquids than solids over the last few years.  Pt reportedly coughs worse with cold items than warm.  Pt denies overt pnas nor weight loss.  Per documentation and son report, pt with chest congestion and cough approx 2 weeks ago that raised concern for asp / s/p ABX tx.  CXR 07/04/2012 showed cardimegaly, increased lung markings suggesting chronic interstitial  lung disease, no definite infiltrate.  Chronic throat clearing and subtle cough observed prior  to initiation  of barium intake -of which pt was not aware.       Assessment / Plan / Recommendation Clinical Impression  Dysphagia Diagnosis: Suspected primary esophageal dysphagia;Mild cervical esophageal phase dysphagia Clinical impression: Suspect pt with moderate cervical esophageal dysphagia - ? secondary to spine curvature and known h/o GERD/hiatal hernia.  Swallow appeared timely.  No aspiration or penetration observed and trace to mild amounts of pharyngeal stasis cleared with DELAYED reflexive swallows.  Upon esophageal sweep, pt appeared with variable amounts of stasis (worse with solid/pudding) with retrograde propulsion of liquids.  Pt reported sensation of stasis to pharynx - suspect referrant due to vagus nerve.  Thin water aided clearance of barium.  Pt's chronic throat clearing/cough may also be indicative of laryngopharyngeal reflux.  Pt acknowledges xerostomia, advised to Biotene (with MD approval) and tips.  Advised pt to start meals with liquids, masticate foods completely and follow solids with liquids to aid clearance of esophagus. Suspect primary aspiration risk is due to esophageal issues, ? component of dysmotility.  Radiologist not present.   Son Nedra Hai and pt verbalized that he observed pt eating in bed a few weeks ago, advised against this behavior and to eat small meals/snacks as able.  Tips provided in writing and verbally and Son and pt verbalized understanding to information provided.  Thanks for this referral     Treatment Recommendation       Diet Recommendation Regular;Thin liquid   Liquid Administration via: Straw;Cup Medication Administration: Other (Comment) (as  tolerated) Supervision: Patient able to self feed Compensations: Follow solids with liquid;Small sips/bites;Slow rate Postural Changes and/or Swallow Maneuvers: Upright 30-60 min after meal;Seated upright 90 degrees    Other  Recommendations Oral Care Recommendations: Oral care BID   Follow Up Recommendations  None      Frequency and Duration        Pertinent Vitals/Pain Afebrile, decreased   SLP Swallow Goals     General Date of Onset: 07/13/12 HPI: Pt referred by Dr Waynard Edwards for MBS due to concerns pt may be aspirating.  Pt is a resident of [facility].  Son Nedra Hai present during testing and reports concern with pt's recent coughing, congestion and hoarseness- this has largely resolved per son .  PMH + for dementia, mild GERD, hiatal hernia, Non-Hodgkin's lymphoma (Lt tonsillar region) s/p surgery and chemo - no radiation, hearing loss, UTI, Afib, Hip fx 11/12, HTN, hypothyroidism, anxiety, AR seasonal, afib, syndrome of inappropriate antidiuretic hormone secretion, whooping cough as a child.  Head CT 10/16/11 atrophy, chronic microvascular ischemia.  Pt denies problems with dysphagia  but son states she coughs more with liquids than solids over the last few years.  Pt reportedly coughs worse with cold items than warm.  Pt denies overt pnas nor weight loss.  Per documentation and son report, pt with chest congestion and cough approx 2 weeks ago that raised concern for asp / s/p ABX tx.  CXR 07/04/2012 showed cardimegaly, increased lung markings suggesting chronic interstitial  lung disease, no definite infiltrate.  Chronic throat clearing and subtle cough observed prior  to initiation of barium intake -of which pt was not aware.   Type of Study: Modified Barium Swallowing Study Reason for Referral: Objectively evaluate swallowing function Diet Prior to this Study: Regular;Thin liquids Temperature Spikes Noted: No Respiratory Status: Room air History of Recent Intubation: No Behavior/Cognition: Alert;Cooperative;Hard of hearing;Pleasant mood Oral Cavity - Dentition: Adequate natural dentition Oral Motor / Sensory Function: Within functional limits Self-Feeding Abilities: Able to feed self Patient Positioning: Upright in chair Baseline Vocal Quality: Clear Volitional Cough: Strong Volitional Swallow:  Unable to elicit Pharyngeal Secretions: Not observed secondary MBS    Reason for Referral Objectively evaluate swallowing function   Oral Phase   Oral Phase:  WFL  Pharyngeal Phase Pharyngeal Phase: Impaired   Cervical Esophageal Phase    GO Functional Assessment Tool Used: MBS Functional Limitations: Swallowing Swallow Current Status (A5409): At least 1 percent but less than 20 percent impaired, limited or restricted Swallow Goal Status [phone]): At least 1 percent but less than 20 percent impaired, limited or restricted Swallow Discharge Status [id number]): At least 1 percent but less than 20 percent impaired, limited or restricted     Cervical Esophageal Phase: Impaired   Donavan Burnet, MS [facility] SLP [phone]

## 2012-07-21 ENCOUNTER — Ambulatory Visit
Admission: RE | Admit: 2012-07-21 | Discharge: 2012-07-21 | Disposition: A | Discharge status: Home / Self Care | Payer: Medicare Other | Source: Ambulatory Visit | Attending: Internal Medicine | Admitting: Internal Medicine

## 2012-07-21 DIAGNOSIS — Z1231 Encounter for screening mammogram for malignant neoplasm of breast: Secondary | ICD-10-CM

## 2012-12-20 ENCOUNTER — Encounter (HOSPITAL_BASED_OUTPATIENT_CLINIC_OR_DEPARTMENT_OTHER): Payer: Self-pay | Admitting: *Deleted

## 2012-12-20 ENCOUNTER — Emergency Department (HOSPITAL_BASED_OUTPATIENT_CLINIC_OR_DEPARTMENT_OTHER)
Admission: EM | Admit: 2012-12-20 | Discharge: 2012-12-20 | Disposition: A | Discharge status: Home / Self Care | Payer: Medicare Other | Attending: Emergency Medicine | Admitting: Emergency Medicine

## 2012-12-20 ENCOUNTER — Emergency Department (HOSPITAL_BASED_OUTPATIENT_CLINIC_OR_DEPARTMENT_OTHER): Payer: Medicare Other

## 2012-12-20 DIAGNOSIS — Z87440 Personal history of urinary (tract) infections: Secondary | ICD-10-CM | POA: Insufficient documentation

## 2012-12-20 DIAGNOSIS — C8589 Other specified types of non-Hodgkin lymphoma, extranodal and solid organ sites: Secondary | ICD-10-CM | POA: Insufficient documentation

## 2012-12-20 DIAGNOSIS — Y9389 Activity, other specified: Secondary | ICD-10-CM | POA: Insufficient documentation

## 2012-12-20 DIAGNOSIS — W19XXXA Unspecified fall, initial encounter: Secondary | ICD-10-CM

## 2012-12-20 DIAGNOSIS — I10 Essential (primary) hypertension: Secondary | ICD-10-CM | POA: Insufficient documentation

## 2012-12-20 DIAGNOSIS — Y9289 Other specified places as the place of occurrence of the external cause: Secondary | ICD-10-CM | POA: Insufficient documentation

## 2012-12-20 DIAGNOSIS — Z79899 Other long term (current) drug therapy: Secondary | ICD-10-CM | POA: Insufficient documentation

## 2012-12-20 DIAGNOSIS — S8000XA Contusion of unspecified knee, initial encounter: Secondary | ICD-10-CM | POA: Insufficient documentation

## 2012-12-20 DIAGNOSIS — F0390 Unspecified dementia without behavioral disturbance: Secondary | ICD-10-CM | POA: Insufficient documentation

## 2012-12-20 DIAGNOSIS — W010XXA Fall on same level from slipping, tripping and stumbling without subsequent striking against object, initial encounter: Secondary | ICD-10-CM | POA: Insufficient documentation

## 2012-12-20 LAB — URINALYSIS, ROUTINE W REFLEX MICROSCOPIC
Glucose, UA: NEGATIVE mg/dL
Hgb urine dipstick: NEGATIVE
Specific Gravity, Urine: 1.019 (ref 1.005–1.030)
Urobilinogen, UA: 2 mg/dL — ABNORMAL HIGH (ref 0.0–1.0)

## 2012-12-20 LAB — URINE MICROSCOPIC-ADD ON

## 2012-12-20 NOTE — ED Notes (Signed)
MD at bedside. 

## 2012-12-20 NOTE — ED Provider Notes (Addendum)
History     CSN: [id number]  Arrival date & time 12/20/12  1409   First MD Initiated Contact with Patient 12/20/12 1410      Chief Complaint  Patient presents with  . Knee Pain    (Consider location/radiation/quality/duration/timing/severity/associated sxs/prior treatment) HPI Comments: Patient was brought in by EMS after sustaining a fall. She lives in an assisted living facility and has a history of dementia. She states that she tripped over her feet and landed on her knees. She denies hitting her head or any loss of consciousness. The nursing home and stated that she had bruising and pain to her knees however she denies any complaints of pain. She denies any neck or back pain. She denies any nausea vomiting or dizziness. She denies any hip or other extremity pain. Of note she is on Coumadin  Patient is a 79 y.o. female presenting with knee pain.  Knee Pain Pertinent negatives include no chest pain, no abdominal pain, no headaches and no shortness of breath.    Past Medical History  Diagnosis Date  . Urinary tract infection   . Cancer     non-hodgkins lymphoma   . Lymphoma in remission     ca free 5 yrs  . Dementia   . Atrial fibrillation   . Hypertension   . Mitral valve prolapse     Past Surgical History  Procedure Date  . Eye surgery     bil.cataract with lens implants  . Hip arthroplasty 10/18/2011    Procedure: ARTHROPLASTY BIPOLAR HIP;  Surgeon: [name];  Location: [facility];  Service: Orthopedics;  Laterality: Left;  depuy    Family History  Problem Relation Age of Onset  . Heart disease Father   . Angina Father   . Stomach cancer Brother 83  . Coronary artery disease Brother     History  Substance Use Topics  . Smoking status: Never Smoker   . Smokeless tobacco: Never Used  . Alcohol Use: No    OB History    Grav Para Term Preterm Abortions TAB SAB Ect Mult Living                  Review of Systems  Constitutional: Negative for fever,  chills, diaphoresis and fatigue.  HENT: Negative for congestion, rhinorrhea and sneezing.   Eyes: Negative.   Respiratory: Negative for cough, chest tightness and shortness of breath.   Cardiovascular: Negative for chest pain and leg swelling.  Gastrointestinal: Negative for nausea, vomiting, abdominal pain, diarrhea and blood in stool.  Genitourinary: Negative for frequency, hematuria, flank pain and difficulty urinating.  Musculoskeletal: Negative for back pain and arthralgias.  Skin: Negative for rash.  Neurological: Negative for dizziness, speech difficulty, weakness, numbness and headaches.    Allergies  Review of patient's allergies indicates no known allergies.  Home Medications   Current Outpatient Rx  Name  Route  Sig  Dispense  Refill  . ALENDRONATE SODIUM 70 MG PO TABS   Oral   Take 70 mg by mouth every 7 (seven) days. Take with a full glass of water on an empty stomach.         . AMLODIPINE BESYLATE 5 MG PO TABS   Oral   Take 5 mg by mouth daily.           . B COMPLEX PO TABS   Oral   Take 1 tablet by mouth daily.         . DONEPEZIL HCL 23  MG PO TABS   Oral   Take 23 mg by mouth at bedtime.           Marland Kitchen ESCITALOPRAM OXALATE 10 MG PO TABS   Oral   Take 5 mg by mouth daily.           Marland Kitchen FERROUS SULFATE 325 (65 FE) MG PO TABS   Oral   Take 1 tablet (325 mg total) by mouth 3 (three) times daily after meals.   90 tablet   0   . FLORA-Q PO CAPS   Oral   Take 1 capsule by mouth daily.   30 capsule   0   . IRBESARTAN 300 MG PO TABS   Oral   Take 300 mg by mouth at bedtime.         Marland Kitchen LEVOXYL 75 MCG PO TABS      TAKE 1 TABLET BY MOUTH EVERY DAY   90 tablet   3     Dispense as written.   Marland Kitchen LOSARTAN POTASSIUM 100 MG PO TABS   Oral   Take 50 mg by mouth daily.           Marland Kitchen MEMANTINE HCL 10 MG PO TABS   Oral   Take 10 mg by mouth 2 (two) times daily.           Marland Kitchen METOPROLOL TARTRATE 25 MG PO TABS   Oral   Take 1 tablet (25 mg total)  by mouth 3 (three) times daily.   90 tablet   1   . VITAMIN D (ERGOCALCIFEROL) 50000 UNITS PO CAPS   Oral   Take 50,000 Units by mouth every 7 (seven) days.           . WARFARIN SODIUM 2.5 MG PO TABS   Oral   Take 1 tablet (2.5 mg total) by mouth daily.   30 tablet   1     BP 131/79  Pulse 77  Temp 98.6 F (37 C) (Oral)  Resp 18  SpO2 96%  Physical Exam  Constitutional: She is oriented to person, place, and time. She appears well-developed and well-nourished.  HENT:  Head: Normocephalic and atraumatic.  Eyes: Pupils are equal, round, and reactive to light.  Neck: Normal range of motion. Neck supple.       No pain to neck or back  Cardiovascular: Normal rate, regular rhythm and normal heart sounds.   Pulmonary/Chest: Effort normal and breath sounds normal. No respiratory distress. She has no wheezes. She has no rales. She exhibits no tenderness.       No signs of external trauma the chest or abdomen  Abdominal: Soft. Bowel sounds are normal. There is no tenderness. There is no rebound and no guarding.  Musculoskeletal: Normal range of motion. She exhibits no edema.       Patient has some very subtle abrasions to the anterior aspect of both knees. There's no swelling or deformity noted. There is no bony tenderness noted. There's no effusions noted. She has no pain on palpation or range of motion of the extremities including the knees and hips.  Lymphadenopathy:    She has no cervical adenopathy.  Neurological: She is alert and oriented to person, place, and time.  Skin: Skin is warm and dry. No rash noted.  Psychiatric: She has a normal mood and affect.    ED Course  Procedures (including critical care time)   Results for orders placed during the hospital encounter of 12/20/12  URINALYSIS,  ROUTINE W REFLEX MICROSCOPIC      Component Value Range   Color, Urine YELLOW  YELLOW   APPearance CLEAR  CLEAR   Specific Gravity, Urine 1.019  1.005 - 1.030   pH 6.5  5.0 -  8.0   Glucose, UA NEGATIVE  NEGATIVE mg/dL   Hgb urine dipstick NEGATIVE  NEGATIVE   Bilirubin Urine NEGATIVE  NEGATIVE   Ketones, ur NEGATIVE  NEGATIVE mg/dL   Protein, ur 30 (*) NEGATIVE mg/dL   Urobilinogen, UA 2.0 (*) 0.0 - 1.0 mg/dL   Nitrite NEGATIVE  NEGATIVE   Leukocytes, UA TRACE (*) NEGATIVE  URINE MICROSCOPIC-ADD ON      Component Value Range   Squamous Epithelial / LPF RARE  RARE   WBC, UA 0-2  <3 WBC/hpf   Bacteria, UA RARE  RARE   Ct Head Wo Contrast  12/20/2012  *RADIOLOGY REPORT*  Clinical Data: Fall.  Non-Hodgkins lymphoma.  Hypertension. Dementia.  CT HEAD WITHOUT CONTRAST  Technique:  Contiguous axial images were obtained from the base of the skull through the vertex without contrast.  Comparison: Multiple exams, including 10/16/2011  Findings: The brain stem, cerebellum, cerebral peduncles, thalami, basal ganglia, basilar cisterns, and ventricular system appear unremarkable.  Periventricular and corona radiata white matter hypodensities are most compatible with chronic ischemic microvascular white matter disease.  No intracranial hemorrhage, mass lesion, or acute infarction is identified.  Chronic left posterior ethmoid and chronic left sphenoid sinusitis observed.  IMPRESSION:  1.  Chronic left ethmoid and sphenoid sinusitis. 2. Periventricular and corona radiata white matter hypodensities are most compatible with chronic ischemic microvascular white matter disease. 3.  No acute intracranial findings.   Original Report Authenticated By: Gaylyn Rong, M.D.        1. Fall   2. Knee contusion       MDM  Patient is to ambulate with no pain in her extremities I don't feel that her imaging is necessary. Given the fact that she's on Coumadin and she has dementia so her story is a little bit unreliable as to whether or not she hit her head, I will go ahead and do CT scan imaging of her brain. The facility have requested that we check a urinalysis which we will do as  well.   Pt ambulating without problem.  CT neg for ICH.  Urine unremarkable, sent for culture     Rolan Bucco, MD 12/20/12 1548  Rolan Bucco, MD 12/20/12 1551

## 2012-12-20 NOTE — ED Notes (Signed)
Lives at [facility] pt was ambulating in her assisted living apartment she is a bit unsteady tripped fell to her knees no loss of consciousness or bruising or deformity noted to knees or extremities

## 2012-12-20 NOTE — ED Notes (Signed)
Pt ambulated with assistance to bathroom with no complaint of pain or unsteadyness.

## 2012-12-20 NOTE — ED Notes (Signed)
Patient transported to CT 

## 2012-12-22 LAB — URINE CULTURE

## 2013-03-08 ENCOUNTER — Emergency Department (HOSPITAL_BASED_OUTPATIENT_CLINIC_OR_DEPARTMENT_OTHER)
Admission: EM | Admit: 2013-03-08 | Discharge: 2013-03-08 | Disposition: A | Discharge status: Skilled Nursing Facility | Payer: Medicare Other | Attending: Emergency Medicine | Admitting: Emergency Medicine

## 2013-03-08 ENCOUNTER — Encounter (HOSPITAL_BASED_OUTPATIENT_CLINIC_OR_DEPARTMENT_OTHER): Payer: Self-pay

## 2013-03-08 ENCOUNTER — Emergency Department (HOSPITAL_BASED_OUTPATIENT_CLINIC_OR_DEPARTMENT_OTHER): Payer: Medicare Other

## 2013-03-08 DIAGNOSIS — Y939 Activity, unspecified: Secondary | ICD-10-CM | POA: Insufficient documentation

## 2013-03-08 DIAGNOSIS — S99919A Unspecified injury of unspecified ankle, initial encounter: Secondary | ICD-10-CM | POA: Insufficient documentation

## 2013-03-08 DIAGNOSIS — Y9229 Other specified public building as the place of occurrence of the external cause: Secondary | ICD-10-CM | POA: Insufficient documentation

## 2013-03-08 DIAGNOSIS — Z79899 Other long term (current) drug therapy: Secondary | ICD-10-CM | POA: Insufficient documentation

## 2013-03-08 DIAGNOSIS — Z7901 Long term (current) use of anticoagulants: Secondary | ICD-10-CM | POA: Insufficient documentation

## 2013-03-08 DIAGNOSIS — W07XXXA Fall from chair, initial encounter: Secondary | ICD-10-CM | POA: Insufficient documentation

## 2013-03-08 DIAGNOSIS — C8589 Other specified types of non-Hodgkin lymphoma, extranodal and solid organ sites: Secondary | ICD-10-CM | POA: Insufficient documentation

## 2013-03-08 DIAGNOSIS — S79919A Unspecified injury of unspecified hip, initial encounter: Secondary | ICD-10-CM | POA: Insufficient documentation

## 2013-03-08 DIAGNOSIS — Z8679 Personal history of other diseases of the circulatory system: Secondary | ICD-10-CM | POA: Insufficient documentation

## 2013-03-08 DIAGNOSIS — F0390 Unspecified dementia without behavioral disturbance: Secondary | ICD-10-CM | POA: Insufficient documentation

## 2013-03-08 DIAGNOSIS — Z9889 Other specified postprocedural states: Secondary | ICD-10-CM | POA: Insufficient documentation

## 2013-03-08 DIAGNOSIS — I10 Essential (primary) hypertension: Secondary | ICD-10-CM | POA: Insufficient documentation

## 2013-03-08 DIAGNOSIS — S8990XA Unspecified injury of unspecified lower leg, initial encounter: Secondary | ICD-10-CM | POA: Insufficient documentation

## 2013-03-08 DIAGNOSIS — Z87440 Personal history of urinary (tract) infections: Secondary | ICD-10-CM | POA: Insufficient documentation

## 2013-03-08 DIAGNOSIS — S79929A Unspecified injury of unspecified thigh, initial encounter: Secondary | ICD-10-CM | POA: Insufficient documentation

## 2013-03-08 NOTE — ED Provider Notes (Signed)
History     CSN: [id number]  Arrival date & time 03/08/13  0907   None     Chief Complaint  Patient presents with  . Fall  . Hip Pain  . Knee Pain    (Consider location/radiation/quality/duration/timing/severity/associated sxs/prior treatment) Patient is a 79 y.o. female presenting with fall, hip pain, and knee pain. The history is provided by the patient and medical records. No language interpreter was used.  Fall The accident occurred less than 1 hour ago. Fall occurred: Patient was at the hair salon and fell, hurting her left knee. She fell from a height of 1 to 2 ft. She landed on carpet. The point of impact was the left knee. The pain is present in the left knee. The pain is mild. She was ambulatory at the scene. There was no entrapment after the fall. There was no drug use involved in the accident. There was no alcohol use involved in the accident. Pertinent negatives include no fever. Associated symptoms comments: None.. Exacerbated by: Nothing. Treatments tried: Patient was brought to the ED by EMS.  Hip Pain  Knee Pain Associated symptoms: no fever     Past Medical History  Diagnosis Date  . Urinary tract infection   . Cancer     non-hodgkins lymphoma   . Lymphoma in remission     ca free 5 yrs  . Dementia   . Atrial fibrillation   . Hypertension   . Mitral valve prolapse     Past Surgical History  Procedure Laterality Date  . Eye surgery      bil.cataract with lens implants  . Hip arthroplasty  10/18/2011    Procedure: ARTHROPLASTY BIPOLAR HIP;  Surgeon: [name];  Location: [facility];  Service: Orthopedics;  Laterality: Left;  depuy    Family History  Problem Relation Age of Onset  . Heart disease Father   . Angina Father   . Stomach cancer Brother 62  . Coronary artery disease Brother     History  Substance Use Topics  . Smoking status: Never Smoker   . Smokeless tobacco: Never Used  . Alcohol Use: No    OB History   Grav Para Term Preterm  Abortions TAB SAB Ect Mult Living                  Review of Systems  Constitutional: Negative for fever and chills.  HENT: Negative.   Eyes: Negative.   Respiratory: Negative.   Cardiovascular: Negative.   Gastrointestinal: Negative.   Genitourinary: Negative.   Musculoskeletal:       Mild pain in the left knee.  Skin: Negative.   Neurological: Negative.   Psychiatric/Behavioral:       Seems pleasantly demented.    Allergies  Review of patient's allergies indicates no known allergies.  Home Medications   Current Outpatient Rx  Name  Route  Sig  Dispense  Refill  . alendronate (FOSAMAX) 70 MG tablet   Oral   Take 70 mg by mouth every 7 (seven) days. Take with a full glass of water on an empty stomach.         Marland Kitchen amLODipine (NORVASC) 5 MG tablet   Oral   Take 5 mg by mouth daily.           Marland Kitchen b complex vitamins tablet   Oral   Take 1 tablet by mouth daily.         Marland Kitchen donepezil (ARICEPT) 23 MG TABS tablet  Oral   Take 23 mg by mouth at bedtime.           Marland Kitchen escitalopram (LEXAPRO) 10 MG tablet   Oral   Take 5 mg by mouth daily.           Marland Kitchen EXPIRED: ferrous sulfate 325 (65 FE) MG tablet   Oral   Take 1 tablet (325 mg total) by mouth 3 (three) times daily after meals.   90 tablet   0   . Flora-Q (FLORA-Q) CAPS   Oral   Take 1 capsule by mouth daily.   30 capsule   0   . irbesartan (AVAPRO) 300 MG tablet   Oral   Take 300 mg by mouth at bedtime.         Marland Kitchen LEVOXYL 75 MCG tablet      TAKE 1 TABLET BY MOUTH EVERY DAY   90 tablet   3     Dispense as written.   Marland Kitchen losartan (COZAAR) 100 MG tablet   Oral   Take 50 mg by mouth daily.           . memantine (NAMENDA) 10 MG tablet   Oral   Take 10 mg by mouth 2 (two) times daily.           Marland Kitchen EXPIRED: metoprolol tartrate (LOPRESSOR) 25 MG tablet   Oral   Take 1 tablet (25 mg total) by mouth 3 (three) times daily.   90 tablet   1   . Vitamin D, Ergocalciferol, (DRISDOL) 50000 UNITS CAPS    Oral   Take 50,000 Units by mouth every 7 (seven) days.           Marland Kitchen EXPIRED: warfarin (COUMADIN) 2.5 MG tablet   Oral   Take 1 tablet (2.5 mg total) by mouth daily.   30 tablet   1     BP 147/88  Pulse 77  Temp(Src) 98 F (36.7 C) (Oral)  Resp 16  SpO2 98%  Physical Exam  Nursing note and vitals reviewed. Constitutional:  Pleasant elderly lady, in no distress.  HENT:  Head: Normocephalic and atraumatic.  Right Ear: External ear normal.  Left Ear: External ear normal.  Mouth/Throat: Oropharynx is clear and moist.  Eyes: Conjunctivae and EOM are normal. Pupils are equal, round, and reactive to light. No scleral icterus.  Neck: Normal range of motion. Neck supple.  Cardiovascular: Normal rate, regular rhythm and normal heart sounds.   Pulmonary/Chest: Breath sounds normal. She is in respiratory distress.  Abdominal: Soft. Bowel sounds are normal.  Musculoskeletal:  She localizes pain to the left patellar area. There is no palpable deformity. Skin is intact without bruising. There is no ligamentous instability or palpable bony deformity. She has intact pulses sensation and tendon function in the left foot. Her left hip has full range of motion without palpable deformity or point tenderness.  Skin: Skin is warm and dry.  Psychiatric: She has a normal mood and affect. Her behavior is normal.    ED Course  Procedures (including critical care time)  9:37 AM Patient was seen and had physical examination. X-rays of the left knee were ordered.  10:19 AM Results for orders placed during the hospital encounter of 12/20/12  URINE CULTURE      Result Value Range   Specimen Description URINE, CATHETERIZED     Special Requests NONE     Culture  Setup Time 12/21/2012 05:23     Colony Count NO GROWTH  Culture NO GROWTH     Report Status 12/22/2012 FINAL    URINALYSIS, ROUTINE W REFLEX MICROSCOPIC      Result Value Range   Color, Urine YELLOW  YELLOW   APPearance CLEAR   CLEAR   Specific Gravity, Urine 1.019  1.005 - 1.030   pH 6.5  5.0 - 8.0   Glucose, UA NEGATIVE  NEGATIVE mg/dL   Hgb urine dipstick NEGATIVE  NEGATIVE   Bilirubin Urine NEGATIVE  NEGATIVE   Ketones, ur NEGATIVE  NEGATIVE mg/dL   Protein, ur 30 (*) NEGATIVE mg/dL   Urobilinogen, UA 2.0 (*) 0.0 - 1.0 mg/dL   Nitrite NEGATIVE  NEGATIVE   Leukocytes, UA TRACE (*) NEGATIVE  URINE MICROSCOPIC-ADD ON      Result Value Range   Squamous Epithelial / LPF RARE  RARE   WBC, UA 0-2  <3 WBC/hpf   Bacteria, UA RARE  RARE   Dg Knee Complete 4 Views Left  03/08/2013  *RADIOLOGY REPORT*  Clinical Data: Post fall, now with left anterior knee pain  LEFT KNEE - COMPLETE 4+ VIEW  Comparison: None.  Findings:  Osteopenia.  No definite fracture or dislocation.  Mild tricompartmental degenerative change with joint space loss, subchondral sclerosis and osteophytosis.  Chondrocalcinosis suggesting CPPD.  No definite suprapatellar joint effusion. Vascular calcifications.  Regional soft tissues otherwise normal.  IMPRESSION: 1.  Osteopenia without definite fracture. 2.  Mild tricompartmental degenerative change of the knee. 3.  Chondrocalcinosis suggestive of CPPD.   Original Report Authenticated By: Tacey Ruiz, MD     X-rays were negative.  Reassured and released.  1. Fall from chair, initial encounter        Carleene Cooper III, MD 03/09/13 1250

## 2013-03-08 NOTE — ED Notes (Signed)
Report called to [facility], staff member at [facility].  [facility] here to transport pt.

## 2013-03-08 NOTE — ED Notes (Signed)
Per EMS, pt slid out of chair while in the hair salon. C/O left hip and left knee pain.

## 2013-03-08 NOTE — ED Notes (Signed)
MD at bedside. 

## 2013-03-08 NOTE — ED Notes (Signed)
[facility] notified for transport back to nursing facility.

## 2013-03-08 NOTE — ED Notes (Signed)
Pt states she tripped and fell this am injuring her left knee.  Denies LOC.  No abrasion or deformity noted to left knee.

## 2013-03-08 NOTE — ED Notes (Signed)
Patient transported to X-ray 

## 2013-03-09 NOTE — ED Provider Notes (Signed)
History     CSN: [id number]  Arrival date & time 03/08/13  0907   First MD Initiated Contact with Patient 03/08/13 [phone]      Chief Complaint  Patient presents with  . Fall  . Hip Pain  . Knee Pain    (Consider location/radiation/quality/duration/timing/severity/associated sxs/prior treatment) HPI  Past Medical History  Diagnosis Date  . Urinary tract infection   . Cancer     non-hodgkins lymphoma   . Lymphoma in remission     ca free 5 yrs  . Dementia   . Atrial fibrillation   . Hypertension   . Mitral valve prolapse     Past Surgical History  Procedure Laterality Date  . Eye surgery      bil.cataract with lens implants  . Hip arthroplasty  10/18/2011    Procedure: ARTHROPLASTY BIPOLAR HIP;  Surgeon: [name];  Location: [facility];  Service: Orthopedics;  Laterality: Left;  depuy    Family History  Problem Relation Age of Onset  . Heart disease Father   . Angina Father   . Stomach cancer Brother 68  . Coronary artery disease Brother     History  Substance Use Topics  . Smoking status: Never Smoker   . Smokeless tobacco: Never Used  . Alcohol Use: No    OB History   Grav Para Term Preterm Abortions TAB SAB Ect Mult Living                  Review of Systems  Allergies  Review of patient's allergies indicates no known allergies.  Home Medications   Current Outpatient Rx  Name  Route  Sig  Dispense  Refill  . alendronate (FOSAMAX) 70 MG tablet   Oral   Take 70 mg by mouth every 7 (seven) days. Take with a full glass of water on an empty stomach.         Marland Kitchen amLODipine (NORVASC) 5 MG tablet   Oral   Take 5 mg by mouth daily.           Marland Kitchen b complex vitamins tablet   Oral   Take 1 tablet by mouth daily.         Marland Kitchen donepezil (ARICEPT) 23 MG TABS tablet   Oral   Take 23 mg by mouth at bedtime.           Marland Kitchen escitalopram (LEXAPRO) 10 MG tablet   Oral   Take 5 mg by mouth daily.           Marland Kitchen EXPIRED: ferrous sulfate 325 (65 FE) MG  tablet   Oral   Take 1 tablet (325 mg total) by mouth 3 (three) times daily after meals.   90 tablet   0   . Flora-Q (FLORA-Q) CAPS   Oral   Take 1 capsule by mouth daily.   30 capsule   0   . irbesartan (AVAPRO) 300 MG tablet   Oral   Take 300 mg by mouth at bedtime.         Marland Kitchen LEVOXYL 75 MCG tablet      TAKE 1 TABLET BY MOUTH EVERY DAY   90 tablet   3     Dispense as written.   Marland Kitchen losartan (COZAAR) 100 MG tablet   Oral   Take 50 mg by mouth daily.           . memantine (NAMENDA) 10 MG tablet   Oral   Take 10 mg by  mouth 2 (two) times daily.           Marland Kitchen EXPIRED: metoprolol tartrate (LOPRESSOR) 25 MG tablet   Oral   Take 1 tablet (25 mg total) by mouth 3 (three) times daily.   90 tablet   1   . Vitamin D, Ergocalciferol, (DRISDOL) 50000 UNITS CAPS   Oral   Take 50,000 Units by mouth every 7 (seven) days.           Marland Kitchen EXPIRED: warfarin (COUMADIN) 2.5 MG tablet   Oral   Take 1 tablet (2.5 mg total) by mouth daily.   30 tablet   1     BP 147/88  Pulse 77  Temp(Src) 98 F (36.7 C) (Oral)  Resp 16  SpO2 98%  Physical Exam  ED Course  Procedures (including critical care time)  Labs Reviewed - No data to display Dg Knee Complete 4 Views Left  03/08/2013  *RADIOLOGY REPORT*  Clinical Data: Post fall, now with left anterior knee pain  LEFT KNEE - COMPLETE 4+ VIEW  Comparison: None.  Findings:  Osteopenia.  No definite fracture or dislocation.  Mild tricompartmental degenerative change with joint space loss, subchondral sclerosis and osteophytosis.  Chondrocalcinosis suggesting CPPD.  No definite suprapatellar joint effusion. Vascular calcifications.  Regional soft tissues otherwise normal.  IMPRESSION: 1.  Osteopenia without definite fracture. 2.  Mild tricompartmental degenerative change of the knee. 3.  Chondrocalcinosis suggestive of CPPD.   Original Report Authenticated By: Tacey Ruiz, MD      1. Fall from chair, initial encounter           Carleene Cooper III, MD 03/09/13 1250

## 2013-03-31 ENCOUNTER — Encounter: Payer: Self-pay | Admitting: Cardiovascular Disease

## 2013-03-31 ENCOUNTER — Encounter: Payer: Self-pay | Admitting: Cardiology

## 2013-06-29 ENCOUNTER — Other Ambulatory Visit: Payer: Self-pay

## 2013-06-29 DIAGNOSIS — Z1231 Encounter for screening mammogram for malignant neoplasm of breast: Secondary | ICD-10-CM

## 2013-08-01 ENCOUNTER — Ambulatory Visit
Admission: RE | Admit: 2013-08-01 | Discharge: 2013-08-01 | Disposition: A | Discharge status: Home / Self Care | Payer: Medicare Other | Source: Ambulatory Visit

## 2013-08-01 DIAGNOSIS — Z1231 Encounter for screening mammogram for malignant neoplasm of breast: Secondary | ICD-10-CM

## 2013-10-07 ENCOUNTER — Emergency Department (HOSPITAL_BASED_OUTPATIENT_CLINIC_OR_DEPARTMENT_OTHER): Payer: Medicare Other

## 2013-10-07 ENCOUNTER — Inpatient Hospital Stay (HOSPITAL_BASED_OUTPATIENT_CLINIC_OR_DEPARTMENT_OTHER)
Admission: EM | Admit: 2013-10-07 | Discharge: 2013-10-11 | DRG: 536 | Disposition: A | Discharge status: Skilled Nursing Facility | Payer: Medicare Other | Attending: Internal Medicine | Admitting: Internal Medicine

## 2013-10-07 ENCOUNTER — Encounter (HOSPITAL_BASED_OUTPATIENT_CLINIC_OR_DEPARTMENT_OTHER): Payer: Self-pay | Admitting: Emergency Medicine

## 2013-10-07 DIAGNOSIS — S329XXA Fracture of unspecified parts of lumbosacral spine and pelvis, initial encounter for closed fracture: Secondary | ICD-10-CM

## 2013-10-07 DIAGNOSIS — S8990XA Unspecified injury of unspecified lower leg, initial encounter: Secondary | ICD-10-CM | POA: Diagnosis present

## 2013-10-07 DIAGNOSIS — N390 Urinary tract infection, site not specified: Secondary | ICD-10-CM | POA: Diagnosis present

## 2013-10-07 DIAGNOSIS — C8589 Other specified types of non-Hodgkin lymphoma, extranodal and solid organ sites: Secondary | ICD-10-CM | POA: Diagnosis present

## 2013-10-07 DIAGNOSIS — W1789XA Other fall from one level to another, initial encounter: Secondary | ICD-10-CM | POA: Diagnosis present

## 2013-10-07 DIAGNOSIS — Z22322 Carrier or suspected carrier of Methicillin resistant Staphylococcus aureus: Secondary | ICD-10-CM

## 2013-10-07 DIAGNOSIS — Z7901 Long term (current) use of anticoagulants: Secondary | ICD-10-CM

## 2013-10-07 DIAGNOSIS — Z96649 Presence of unspecified artificial hip joint: Secondary | ICD-10-CM

## 2013-10-07 DIAGNOSIS — I10 Essential (primary) hypertension: Secondary | ICD-10-CM | POA: Diagnosis present

## 2013-10-07 DIAGNOSIS — I4891 Unspecified atrial fibrillation: Secondary | ICD-10-CM | POA: Diagnosis present

## 2013-10-07 DIAGNOSIS — S32509A Unspecified fracture of unspecified pubis, initial encounter for closed fracture: Principal | ICD-10-CM | POA: Diagnosis present

## 2013-10-07 DIAGNOSIS — M79651 Pain in right thigh: Secondary | ICD-10-CM

## 2013-10-07 DIAGNOSIS — E039 Hypothyroidism, unspecified: Secondary | ICD-10-CM | POA: Diagnosis present

## 2013-10-07 DIAGNOSIS — I059 Rheumatic mitral valve disease, unspecified: Secondary | ICD-10-CM | POA: Diagnosis present

## 2013-10-07 DIAGNOSIS — Z66 Do not resuscitate: Secondary | ICD-10-CM | POA: Diagnosis present

## 2013-10-07 DIAGNOSIS — Z79899 Other long term (current) drug therapy: Secondary | ICD-10-CM

## 2013-10-07 DIAGNOSIS — M810 Age-related osteoporosis without current pathological fracture: Secondary | ICD-10-CM | POA: Diagnosis present

## 2013-10-07 DIAGNOSIS — R413 Other amnesia: Secondary | ICD-10-CM | POA: Diagnosis present

## 2013-10-07 DIAGNOSIS — F0390 Unspecified dementia without behavioral disturbance: Secondary | ICD-10-CM | POA: Diagnosis present

## 2013-10-07 DIAGNOSIS — D649 Anemia, unspecified: Secondary | ICD-10-CM | POA: Diagnosis present

## 2013-10-07 HISTORY — DX: Age-related osteoporosis without current pathological fracture: M81.0

## 2013-10-07 HISTORY — DX: Other amnesia: R41.3

## 2013-10-07 HISTORY — DX: Disorder of thyroid, unspecified: E07.9

## 2013-10-07 LAB — COMPREHENSIVE METABOLIC PANEL
AST: 24 U/L (ref 0–37)
Albumin: 3.2 g/dL — ABNORMAL LOW (ref 3.5–5.2)
BUN: 16 mg/dL (ref 6–23)
Calcium: 9.2 mg/dL (ref 8.4–10.5)
Creatinine, Ser: 0.9 mg/dL (ref 0.50–1.10)

## 2013-10-07 LAB — URINALYSIS, ROUTINE W REFLEX MICROSCOPIC
Glucose, UA: NEGATIVE mg/dL
Protein, ur: 30 mg/dL — AB
Specific Gravity, Urine: 1.022 (ref 1.005–1.030)
Urobilinogen, UA: 1 mg/dL (ref 0.0–1.0)

## 2013-10-07 LAB — URINE MICROSCOPIC-ADD ON

## 2013-10-07 LAB — PROTIME-INR
INR: 1.75 — ABNORMAL HIGH (ref 0.00–1.49)
Prothrombin Time: 19.9 seconds — ABNORMAL HIGH (ref 11.6–15.2)

## 2013-10-07 LAB — CBC WITH DIFFERENTIAL/PLATELET
Basophils Absolute: 0.1 10*3/uL (ref 0.0–0.1)
Basophils Relative: 1 % (ref 0–1)
Eosinophils Absolute: 0.1 10*3/uL (ref 0.0–0.7)
Eosinophils Relative: 1 % (ref 0–5)
HCT: 36.2 % (ref 36.0–46.0)
MCH: 30.5 pg (ref 26.0–34.0)
MCHC: 32 g/dL (ref 30.0–36.0)
MCV: 95.3 fL (ref 78.0–100.0)
Monocytes Absolute: 1.4 10*3/uL — ABNORMAL HIGH (ref 0.1–1.0)
Monocytes Relative: 14 % — ABNORMAL HIGH (ref 3–12)
RDW: 14.3 % (ref 11.5–15.5)

## 2013-10-07 MED ORDER — MORPHINE SULFATE 2 MG/ML IJ SOLN: 2.0000 mg | Freq: Once | INTRAMUSCULAR | Status: DC

## 2013-10-07 MED ORDER — MORPHINE SULFATE 2 MG/ML IJ SOLN
2.0000 mg | Freq: Once | INTRAMUSCULAR | Status: AC
  Administered 2013-10-07: 2 mg via INTRAVENOUS
  Filled 2013-10-07: qty 1

## 2013-10-07 MED ORDER — TRAMADOL HCL 50 MG PO TABS: 50.0000 mg | ORAL_TABLET | Freq: Four times a day (QID) | ORAL | Status: DC | PRN

## 2013-10-07 MED ORDER — MORPHINE SULFATE 4 MG/ML IJ SOLN: 4.0000 mg | Freq: Once | INTRAMUSCULAR | Status: DC

## 2013-10-07 NOTE — ED Notes (Signed)
Xray notified of need for left Hip knee and ankle xrays and that pt agrees to have film done

## 2013-10-07 NOTE — Progress Notes (Signed)
Called [facility] for report on patient. RN unavailable at this time. Left phone number to call back.

## 2013-10-07 NOTE — ED Provider Notes (Addendum)
CSN: [id number]     Arrival date & time 10/07/13  1658 History  This chart was scribed for Geoffery Lyons, MD by Dorothey Baseman, ED Scribe. This patient was seen in room MH09/MH09 and the patient's care was started at 5:47 PM.    Chief Complaint  Patient presents with  . Leg Pain   The history is provided by the patient and a relative (son and daughter). No language interpreter was used.   HPI Comments: Tasha Ball is a 79 y.o. female brought in by ambulance from [facility] with a history of dementia and osteoporosis who presents to the Emergency Department complaining of a constant, mild pain to the right thigh onset 2 days ago with associated swelling to the bilateral ankles. Patient denies any known injury to the areas. She reports that she has had a couple falls in the past month, but denies hitting her head or any injuries. Her daughter reports that the assisted living staff stated that she has not wanted to walk since onset of symptoms. Patient uses a walker for daily ambulation. Her son reports that she appears to be acting appropriately for her baseline. She denies fever and cough. Her son reports a history of left hip replacement in 2012 without complication. Patient also reports a history of lymphoma (in remission) and HTN.   Past Medical History  Diagnosis Date  . Urinary tract infection   . Cancer     non-hodgkins lymphoma   . Lymphoma in remission     ca free 5 yrs  . Dementia   . Atrial fibrillation   . Hypertension   . Mitral valve prolapse    Past Surgical History  Procedure Laterality Date  . Eye surgery      bil.cataract with lens implants  . Hip arthroplasty  10/18/2011    Procedure: ARTHROPLASTY BIPOLAR HIP;  Surgeon: [name];  Location: [facility];  Service: Orthopedics;  Laterality: Left;  depuy   Family History  Problem Relation Age of Onset  . Heart disease Father   . Angina Father   . Stomach cancer Brother 92  . Coronary artery disease Brother     History  Substance Use Topics  . Smoking status: Never Smoker   . Smokeless tobacco: Never Used  . Alcohol Use: No   OB History   Grav Para Term Preterm Abortions TAB SAB Ect Mult Living                 Review of Systems  A complete 10 system review of systems was obtained and all systems are negative except as noted in the HPI and PMH.   Allergies  Review of patient's allergies indicates no known allergies.  Home Medications   Current Outpatient Rx  Name  Route  Sig  Dispense  Refill  . alendronate (FOSAMAX) 70 MG tablet   Oral   Take 70 mg by mouth every 7 (seven) days. Take with a full glass of water on an empty stomach.         Marland Kitchen amLODipine (NORVASC) 5 MG tablet   Oral   Take 2.5 mg by mouth daily.          Marland Kitchen b complex vitamins tablet   Oral   Take 1 tablet by mouth daily.         Marland Kitchen donepezil (ARICEPT) 23 MG TABS tablet   Oral   Take 23 mg by mouth at bedtime.           Marland Kitchen  escitalopram (LEXAPRO) 10 MG tablet   Oral   Take 5 mg by mouth daily.           Marland Kitchen LEVOXYL 75 MCG tablet      TAKE 1 TABLET BY MOUTH EVERY DAY   90 tablet   3     Dispense as written.   Marland Kitchen losartan (COZAAR) 100 MG tablet   Oral   Take 50 mg by mouth daily.          . memantine (NAMENDA) 10 MG tablet   Oral   Take 10 mg by mouth 2 (two) times daily.           . metoprolol (LOPRESSOR) 50 MG tablet   Oral   Take 50 mg by mouth 2 (two) times daily.         . silver sulfADIAZINE (SILVADENE) 1 % cream   Topical   Apply 1 application topically 2 (two) times daily.         . Skin Protectants, Misc. (BAZA PROTECT EX)   Apply externally   Apply topically.         . Vitamin D, Ergocalciferol, (DRISDOL) 50000 UNITS CAPS   Oral   Take 50,000 Units by mouth every 7 (seven) days.           Marland Kitchen warfarin (COUMADIN) 2.5 MG tablet   Oral   Take 1 tablet (2.5 mg total) by mouth daily.   30 tablet   1   . EXPIRED: ferrous sulfate 325 (65 FE) MG tablet   Oral    Take 1 tablet (325 mg total) by mouth 3 (three) times daily after meals.   90 tablet   0   . Flora-Q (FLORA-Q) CAPS   Oral   Take 1 capsule by mouth daily.   30 capsule   0   . irbesartan (AVAPRO) 300 MG tablet   Oral   Take 300 mg by mouth at bedtime.         Marland Kitchen EXPIRED: metoprolol tartrate (LOPRESSOR) 25 MG tablet   Oral   Take 1 tablet (25 mg total) by mouth 3 (three) times daily.   90 tablet   1    Triage Vitals: BP 137/78  Pulse 95  Temp(Src) 98.7 F (37.1 C) (Oral)  Resp 14  SpO2 94%  Physical Exam  Nursing note and vitals reviewed. Constitutional: She is oriented to person, place, and time. She appears well-developed and well-nourished. No distress.  HENT:  Head: Normocephalic and atraumatic.  Eyes: Conjunctivae are normal.  Neck: Normal range of motion. Neck supple.  Cardiovascular: Normal rate, regular rhythm and normal heart sounds.  Exam reveals no gallop and no friction rub.   No murmur heard. Pulmonary/Chest: Effort normal and breath sounds normal. No respiratory distress. She has no wheezes. She has no rales.  Abdominal: She exhibits no distension.  Musculoskeletal: Normal range of motion.  No tenderness to palpation along the length of the spine. Tenderness to palpation to the lateral aspect of the left hip. Minimal pain with external rotation. Distal, motor, and sensory intact.    Neurological: She is alert and oriented to person, place, and time.  Skin: Skin is warm and dry.  Psychiatric: She has a normal mood and affect. Her behavior is normal.    ED Course  Procedures (including critical care time)  DIAGNOSTIC STUDIES: Oxygen Saturation is 94% on room air, adequate by my interpretation.    COORDINATION OF CARE: 5:53 PM- Will order x-rays of  the right hip. Discussed treatment plan with patient at bedside and patient verbalized agreement.     Labs Review Labs Reviewed - No data to display  Imaging Review Dg Hip Complete  Right  10/07/2013   CLINICAL DATA:  Fall, right hip pain  EXAM: RIGHT HIP - COMPLETE 2+ VIEW  COMPARISON:  None.  FINDINGS: Three views of the right hip submitted. No acute fracture or subluxation. There is diffuse osteopenia. Left hip prosthesis in anatomic alignment.  IMPRESSION: No right hip acute fracture or subluxation. Diffuse osteopenia.   Electronically Signed   By: Natasha Mead M.D.   On: 10/07/2013 18:26    EKG Interpretation   None       MDM  No diagnosis found. Patient sent from extended care facility for evaluation of right thigh pain. Patient has been complaining about this for the past 2 days. She denies any injury or trauma but does have a history of dementia. On exam there is no evidence for trauma and she seems to have good range of motion of the hip and thigh. She is neurovascularly intact distal to the pain. X-rays are negative for fracture. At this point I do not feel as though any further workup is indicated and I feel she is appropriate for discharge.  I personally performed the services described in this documentation, which was scribed in my presence. The recorded information has been reviewed and is accurate.       Geoffery Lyons, MD 10/07/13 1846   As the patient was being discharged she began to again complain of severe pain in the right hip and thigh. He attempted to stand and walk her however she was unable to do this. The family was concerned that there was more going on than what I had found thus far. She was then sent for a CT of the right hip which revealed a pubic ramus fracture. Patient continues to be unable to ambulate and the family does not feel as though she can safely be returned to her current assisted living environment. I've spoken with Dr. Adela Glimpse from [facility] who agrees to admit the patient to her service. She'll be admitted for pain control and additional therapy.  Geoffery Lyons, MD 10/07/13 2325

## 2013-10-07 NOTE — ED Notes (Signed)
EMS transport from [facility]- c/o right thigh pain x 2 days- staff and pt deny known injury- swelling to both ankles noted also

## 2013-10-07 NOTE — ED Notes (Signed)
Xray contacted about xrays left hip, knee, and ankle  Pt is next in line for films.

## 2013-10-08 DIAGNOSIS — N390 Urinary tract infection, site not specified: Secondary | ICD-10-CM

## 2013-10-08 DIAGNOSIS — F0390 Unspecified dementia without behavioral disturbance: Secondary | ICD-10-CM

## 2013-10-08 DIAGNOSIS — M79609 Pain in unspecified limb: Secondary | ICD-10-CM

## 2013-10-08 DIAGNOSIS — I10 Essential (primary) hypertension: Secondary | ICD-10-CM

## 2013-10-08 DIAGNOSIS — S329XXA Fracture of unspecified parts of lumbosacral spine and pelvis, initial encounter for closed fracture: Secondary | ICD-10-CM

## 2013-10-08 DIAGNOSIS — I4891 Unspecified atrial fibrillation: Secondary | ICD-10-CM

## 2013-10-08 HISTORY — DX: Fracture of unspecified parts of lumbosacral spine and pelvis, initial encounter for closed fracture: S32.9XXA

## 2013-10-08 LAB — CBC
HCT: 31.7 % — ABNORMAL LOW (ref 36.0–46.0)
Hemoglobin: 10.4 g/dL — ABNORMAL LOW (ref 12.0–15.0)
MCH: 31 pg (ref 26.0–34.0)
MCV: 94.6 fL (ref 78.0–100.0)
Platelets: 193 10*3/uL (ref 150–400)
RBC: 3.35 MIL/uL — ABNORMAL LOW (ref 3.87–5.11)
WBC: 8.3 10*3/uL (ref 4.0–10.5)

## 2013-10-08 LAB — MAGNESIUM: Magnesium: 1.4 mg/dL — ABNORMAL LOW (ref 1.5–2.5)

## 2013-10-08 LAB — COMPREHENSIVE METABOLIC PANEL
AST: 17 U/L (ref 0–37)
Albumin: 2.6 g/dL — ABNORMAL LOW (ref 3.5–5.2)
BUN: 13 mg/dL (ref 6–23)
Calcium: 8.2 mg/dL — ABNORMAL LOW (ref 8.4–10.5)
Chloride: 101 mEq/L (ref 96–112)
Creatinine, Ser: 0.74 mg/dL (ref 0.50–1.10)
Total Bilirubin: 1.1 mg/dL (ref 0.3–1.2)
Total Protein: 5.6 g/dL — ABNORMAL LOW (ref 6.0–8.3)

## 2013-10-08 LAB — TSH: TSH: 2.652 u[IU]/mL (ref 0.350–4.500)

## 2013-10-08 LAB — PHOSPHORUS: Phosphorus: 3 mg/dL (ref 2.3–4.6)

## 2013-10-08 MED ORDER — SODIUM CHLORIDE 0.9 % IV SOLN: INTRAVENOUS | Status: AC

## 2013-10-08 MED ORDER — ONDANSETRON HCL 4 MG PO TABS: 4.0000 mg | ORAL_TABLET | Freq: Four times a day (QID) | ORAL | Status: DC | PRN

## 2013-10-08 MED ORDER — SODIUM CHLORIDE 0.9 % IV SOLN: INTRAVENOUS | Status: DC

## 2013-10-08 MED ORDER — AMLODIPINE BESYLATE 2.5 MG PO TABS
2.5000 mg | ORAL_TABLET | Freq: Every day | ORAL | Status: DC
  Administered 2013-10-08 – 2013-10-10 (×3): 2.5 mg via ORAL
  Filled 2013-10-08 (×4): qty 1

## 2013-10-08 MED ORDER — IRBESARTAN 300 MG PO TABS
300.0000 mg | ORAL_TABLET | Freq: Every day | ORAL | Status: DC
  Filled 2013-10-08: qty 1

## 2013-10-08 MED ORDER — ESCITALOPRAM OXALATE 5 MG PO TABS
5.0000 mg | ORAL_TABLET | Freq: Every day | ORAL | Status: DC
  Administered 2013-10-08 – 2013-10-11 (×4): 5 mg via ORAL
  Filled 2013-10-08 (×4): qty 1

## 2013-10-08 MED ORDER — ACETAMINOPHEN 650 MG RE SUPP: 650.0000 mg | Freq: Four times a day (QID) | RECTAL | Status: DC | PRN

## 2013-10-08 MED ORDER — MEMANTINE HCL 10 MG PO TABS
10.0000 mg | ORAL_TABLET | Freq: Two times a day (BID) | ORAL | Status: DC
  Administered 2013-10-08 – 2013-10-11 (×7): 10 mg via ORAL
  Filled 2013-10-08 (×9): qty 1

## 2013-10-08 MED ORDER — LOSARTAN POTASSIUM 50 MG PO TABS
50.0000 mg | ORAL_TABLET | Freq: Every day | ORAL | Status: DC
  Administered 2013-10-08 – 2013-10-10 (×3): 50 mg via ORAL
  Filled 2013-10-08 (×4): qty 1

## 2013-10-08 MED ORDER — SODIUM CHLORIDE 0.9 % IV SOLN
INTRAVENOUS | Status: DC
  Administered 2013-10-08: 02:00:00 via INTRAVENOUS

## 2013-10-08 MED ORDER — HYDROCODONE-ACETAMINOPHEN 5-325 MG PO TABS: 1.0000 | ORAL_TABLET | Freq: Four times a day (QID) | ORAL | Status: DC | PRN

## 2013-10-08 MED ORDER — WARFARIN - PHARMACIST DOSING INPATIENT
Freq: Every day | Status: DC
  Administered 2013-10-10: 18:00:00

## 2013-10-08 MED ORDER — CHLORHEXIDINE GLUCONATE CLOTH 2 % EX PADS
6.0000 | MEDICATED_PAD | Freq: Every day | CUTANEOUS | Status: DC
  Administered 2013-10-08 – 2013-10-11 (×4): 6 via TOPICAL

## 2013-10-08 MED ORDER — SODIUM CHLORIDE 0.9 % IJ SOLN
3.0000 mL | Freq: Two times a day (BID) | INTRAMUSCULAR | Status: DC
  Administered 2013-10-08 – 2013-10-11 (×5): 3 mL via INTRAVENOUS

## 2013-10-08 MED ORDER — WARFARIN SODIUM 5 MG PO TABS
5.0000 mg | ORAL_TABLET | Freq: Once | ORAL | Status: DC
  Filled 2013-10-08: qty 1

## 2013-10-08 MED ORDER — FERROUS SULFATE 325 (65 FE) MG PO TABS
325.0000 mg | ORAL_TABLET | Freq: Three times a day (TID) | ORAL | Status: DC
  Administered 2013-10-08 – 2013-10-11 (×10): 325 mg via ORAL
  Filled 2013-10-08 (×13): qty 1

## 2013-10-08 MED ORDER — HYDROCODONE-ACETAMINOPHEN 5-325 MG PO TABS: 1.0000 | ORAL_TABLET | ORAL | Status: DC | PRN

## 2013-10-08 MED ORDER — ONDANSETRON HCL 4 MG/2ML IJ SOLN: 4.0000 mg | Freq: Three times a day (TID) | INTRAMUSCULAR | Status: DC | PRN

## 2013-10-08 MED ORDER — HYDROMORPHONE HCL PF 1 MG/ML IJ SOLN: 1.0000 mg | INTRAMUSCULAR | Status: DC | PRN

## 2013-10-08 MED ORDER — METOPROLOL TARTRATE 50 MG PO TABS
50.0000 mg | ORAL_TABLET | Freq: Two times a day (BID) | ORAL | Status: DC
  Administered 2013-10-08 – 2013-10-10 (×6): 50 mg via ORAL
  Filled 2013-10-08 (×8): qty 1

## 2013-10-08 MED ORDER — MIRTAZAPINE 15 MG PO TABS
15.0000 mg | ORAL_TABLET | Freq: Every day | ORAL | Status: DC
  Administered 2013-10-08 – 2013-10-10 (×3): 15 mg via ORAL
  Filled 2013-10-08 (×4): qty 1

## 2013-10-08 MED ORDER — ACETAMINOPHEN 325 MG PO TABS
650.0000 mg | ORAL_TABLET | Freq: Four times a day (QID) | ORAL | Status: DC | PRN
  Administered 2013-10-09: 650 mg via ORAL
  Filled 2013-10-08: qty 2

## 2013-10-08 MED ORDER — ONDANSETRON HCL 4 MG/2ML IJ SOLN: 4.0000 mg | Freq: Four times a day (QID) | INTRAMUSCULAR | Status: DC | PRN

## 2013-10-08 MED ORDER — LEVOTHYROXINE SODIUM 75 MCG PO TABS
75.0000 ug | ORAL_TABLET | Freq: Every day | ORAL | Status: DC
  Administered 2013-10-08 – 2013-10-11 (×4): 75 ug via ORAL
  Filled 2013-10-08 (×7): qty 1

## 2013-10-08 MED ORDER — DOCUSATE SODIUM 100 MG PO CAPS
100.0000 mg | ORAL_CAPSULE | Freq: Two times a day (BID) | ORAL | Status: DC
  Administered 2013-10-08 – 2013-10-11 (×5): 100 mg via ORAL
  Filled 2013-10-08 (×8): qty 1

## 2013-10-08 MED ORDER — MUPIROCIN 2 % EX OINT
1.0000 "application " | TOPICAL_OINTMENT | Freq: Two times a day (BID) | CUTANEOUS | Status: DC
  Administered 2013-10-08 – 2013-10-11 (×7): 1 via NASAL
  Filled 2013-10-08: qty 22

## 2013-10-08 MED ORDER — DEXTROSE 5 % IV SOLN
1.0000 g | INTRAVENOUS | Status: DC
  Administered 2013-10-08 – 2013-10-11 (×4): 1 g via INTRAVENOUS
  Filled 2013-10-08 (×5): qty 10

## 2013-10-08 MED ORDER — DONEPEZIL HCL 23 MG PO TABS
23.0000 mg | ORAL_TABLET | Freq: Every day | ORAL | Status: DC
  Administered 2013-10-08 – 2013-10-10 (×3): 23 mg via ORAL
  Filled 2013-10-08 (×4): qty 1

## 2013-10-08 MED ORDER — POTASSIUM CHLORIDE 20 MEQ/15ML (10%) PO LIQD
40.0000 meq | Freq: Two times a day (BID) | ORAL | Status: AC
  Administered 2013-10-08 (×2): 40 meq via ORAL
  Filled 2013-10-08 (×2): qty 30

## 2013-10-08 MED ORDER — WARFARIN SODIUM 5 MG PO TABS
5.0000 mg | ORAL_TABLET | Freq: Once | ORAL | Status: AC
  Administered 2013-10-08: 18:00:00 5 mg via ORAL
  Filled 2013-10-08: qty 1

## 2013-10-08 MED ORDER — MORPHINE SULFATE 2 MG/ML IJ SOLN: 2.0000 mg | INTRAMUSCULAR | Status: DC | PRN

## 2013-10-08 MED ORDER — LORAZEPAM 2 MG/ML IJ SOLN
0.5000 mg | Freq: Four times a day (QID) | INTRAMUSCULAR | Status: DC | PRN
  Administered 2013-10-08: 06:00:00 0.5 mg via INTRAVENOUS
  Filled 2013-10-08: qty 1

## 2013-10-08 NOTE — ED Notes (Signed)
Pt son Krystiana Fornes at bedside states his mother is a full DNR and copy of Renette Butters DNR order in chart to be transported with pt on admission. Son further states he can be reached at cellphone [phone]

## 2013-10-08 NOTE — Progress Notes (Signed)
ANTICOAGULATION CONSULT NOTE - Initial Consult  Pharmacy Consult for Coumadin Indication: atrial fibrillation  No Known Allergies  Patient Measurements: Height: 5\' 2"  (157.5 cm) Weight: 113 lb 3.2 oz (51.347 kg) IBW/kg (Calculated) : 50.1  Vital Signs: Temp: 98.7 F (37.1 C) (11/09 0139) Temp src: Oral (11/09 0139) BP: 119/65 mmHg (11/09 0139) Pulse Rate: 68 (11/09 0139)  Labs:  Recent Labs  10/07/13 2103  HGB 11.6*  HCT 36.2  PLT 220  LABPROT 19.9*  INR 1.75*  CREATININE 0.90    Estimated Creatinine Clearance: 34.8 ml/min (by C-G formula based on Cr of 0.9).   Medical History: Past Medical History  Diagnosis Date  . Urinary tract infection   . Cancer     non-hodgkins lymphoma   . Lymphoma in remission     ca free 5 yrs  . Dementia   . Atrial fibrillation   . Hypertension   . Mitral valve prolapse   . Thyroid disease   . Memory loss   . Osteoporosis     Medications:  Prescriptions prior to admission  Medication Sig Dispense Refill  . alendronate (FOSAMAX) 70 MG tablet Take 70 mg by mouth every 7 (seven) days. Take with a full glass of water on an empty stomach.      Marland Kitchen amLODipine (NORVASC) 5 MG tablet Take 2.5 mg by mouth daily.       Marland Kitchen b complex vitamins tablet Take 1 tablet by mouth daily.      Marland Kitchen donepezil (ARICEPT) 23 MG TABS tablet Take 23 mg by mouth at bedtime.        Marland Kitchen escitalopram (LEXAPRO) 10 MG tablet Take 5 mg by mouth daily.        Marland Kitchen LEVOXYL 75 MCG tablet TAKE 1 TABLET BY MOUTH EVERY DAY  90 tablet  3  . losartan (COZAAR) 100 MG tablet Take 50 mg by mouth daily.       . memantine (NAMENDA) 10 MG tablet Take 10 mg by mouth 2 (two) times daily.        . metoprolol (LOPRESSOR) 50 MG tablet Take 50 mg by mouth 2 (two) times daily.      . silver sulfADIAZINE (SILVADENE) 1 % cream Apply 1 application topically 2 (two) times daily.      . Skin Protectants, Misc. (BAZA PROTECT EX) Apply topically.      . Vitamin D, Ergocalciferol, (DRISDOL) 50000  UNITS CAPS Take 50,000 Units by mouth every 7 (seven) days.        Marland Kitchen warfarin (COUMADIN) 2.5 MG tablet Take 1 tablet (2.5 mg total) by mouth daily.  30 tablet  1  . ferrous sulfate 325 (65 FE) MG tablet Take 1 tablet (325 mg total) by mouth 3 (three) times daily after meals.  90 tablet  0  . Flora-Q (FLORA-Q) CAPS Take 1 capsule by mouth daily.  30 capsule  0  . irbesartan (AVAPRO) 300 MG tablet Take 300 mg by mouth at bedtime.      . metoprolol tartrate (LOPRESSOR) 25 MG tablet Take 1 tablet (25 mg total) by mouth 3 (three) times daily.  90 tablet  1   Scheduled:  . sodium chloride   Intravenous STAT  . cefTRIAXone (ROCEPHIN) IVPB 1 gram/50 mL D5W  1 g Intravenous Q24H  . docusate sodium  100 mg Oral BID  . sodium chloride  3 mL Intravenous Q12H   Infusions:  . sodium chloride      Assessment: 79yo female c/o thigh  pain x2d w/ swelling to both ankles, assisted living staff deny known injury, CT reveals minimally displaced fracture, admitted for pain control, to continue Coumadin for Afib; INR currently slightly subtherapeutic. Goal of Therapy:  INR 2-3   Plan:  Will give boosted Coumadin dose of 5mg  po x1 today and monitor INR for further dose adjustments.  Vernard Gambles, PharmD, BCPS  10/08/2013,2:08 AM

## 2013-10-08 NOTE — Progress Notes (Signed)
ANTICOAGULATION CONSULT NOTE - Initial Consult  Pharmacy Consult for Coumadin Indication: atrial fibrillation  No Known Allergies  Patient Measurements: Height: 5\' 2"  (157.5 cm) Weight: 113 lb 3.2 oz (51.347 kg) IBW/kg (Calculated) : 50.1  Vital Signs: Temp: 98.6 F (37 C) (11/09 0500) Temp src: Oral (11/09 0500) BP: 124/72 mmHg (11/09 0500) Pulse Rate: 66 (11/09 0500)  Labs:  Recent Labs  10/07/13 2103 10/08/13 0640  HGB 11.6* 10.4*  HCT 36.2 31.7*  PLT 220 193  LABPROT 19.9*  --   INR 1.75*  --   CREATININE 0.90 0.74    Estimated Creatinine Clearance: 39.2 ml/min (by C-G formula based on Cr of 0.74).  Assessment: 78yo female c/o thigh pain x2d w/ swelling to both ankles, assisted living staff deny known injury, CT reveals minimally displaced fracture, admitted for pain control and to manage UTI, to continue Coumadin for Afib; INR currently slightly subtherapeutic.  Patient has not received any doses of warfarin while admitted (missed dose on 11/8).    Goal of Therapy:  INR 2-3   Plan:  1. Will give boost 5mg  x1 tonight 2. Monitor daily INR  Thank you, Piedad Climes, PharmD Clinical Pharmacist - Resident Pager: [phone] Pharmacy: [phone] 10/08/2013 11:08 AM

## 2013-10-08 NOTE — Progress Notes (Signed)
NURSING PROGRESS NOTE  Tasha Ball [id number] Admission Data: 10/08/2013 0139 Attending Provider: Therisa Doyne, MD WUJ:WJXBJY,NWGN A, MD Code Status: DNR  Tasha Ball is a 79 y.o. female patient admitted from ED:  -No acute distress noted.  -No complaints of shortness of breath.  -No complaints of chest pain.   Cardiac Monitoring: Box # [id number] in place. Cardiac monitor yields:atrial fibrillation, with ventricular rate of 73.  Blood pressure 119/65, pulse 68, temperature 98.7 F (37.1 C), temperature source Oral, resp. rate 18, height 5\' 2"  (1.575 m), weight 51.347 kg (113 lb 3.2 oz), SpO2 96.00%.   IV Fluids:  IV in place, occlusive dsg intact without redness, IV cath antecubital right, condition patent and no redness normal saline.   Allergies:  Review of patient's allergies indicates no known allergies.  Past Medical History:   has a past medical history of Urinary tract infection; Cancer; Lymphoma in remission; Dementia; Atrial fibrillation; Hypertension; Mitral valve prolapse; Thyroid disease; Memory loss; and Osteoporosis.  Past Surgical History:   has past surgical history that includes Eye surgery and Hip Arthroplasty (10/18/2011).  Social History:   reports that she has never smoked. She has never used smokeless tobacco. She reports that she does not drink alcohol or use illicit drugs.  Skin: WNL  Patient orientated to room.  Admission inpatient armband information verified with patient to include name and date of birth and placed on patient arm. Side rails up x 2, fall assessment and education completed with patient. Patient able to verbalize understanding of risk associated with falls and verbalized understanding to call for assistance before getting out of bed. Call light within reach. Patientable to voice and demonstrate understanding of unit orientation instructions.  Patient is alert and oriented x 1-3. At times she is aware of where she is, has remained  oriented to self, is unsure on time, and is aware of why she is in the hospital. Fall risk was discussed with patient but will need reinforcement due to orientation.

## 2013-10-08 NOTE — Evaluation (Signed)
Physical Therapy Evaluation Patient Details Name: Tasha Ball MRN: [id number] DOB: May 26, 1924 Today's Date: 10/08/2013 Time: 5284-1324 PT Time Calculation (min): 18 min  PT Assessment / Plan / Recommendation History of Present Illness  Pt is a 79 y.o. female adm secondary to multiple recurrent falls; pt with PMH UTIs , lymphoma (in remission), A-fib, Dementia, HTN, osteoporosis. Pt was found to have UTI and found to have mild pubic ramus fracture.    Clinical Impression  Pt adm due to the above and presents with limitations in mobility secondary to deficits listed below (see PT problem list). Pt to benefit from skilled PT to maximize functional independence with mobility. Pt will benefit from post acute rehab to address balance deficits, increase mobility and decrease caregiver burden.   PT Assessment  Patient needs continued PT services    Follow Up Recommendations  SNF;Supervision/Assistance - 24 hour    Does the patient have the potential to tolerate intense rehabilitation      Barriers to Discharge   pt will requries 24/7 (A)     Equipment Recommendations  Other (comment) (TBD)    Recommendations for Other Services     Frequency Min 3X/week    Precautions / Restrictions Precautions Precautions: Fall Precaution Comments: multiple recent falls; dementia  Required Braces or Orthoses: Other Brace/Splint Other Brace/Splint: Rt ankle brace  Restrictions Weight Bearing Restrictions: No   Pertinent Vitals/Pain Did not rate pain; c/o pain with activity in Rt hip. patient repositioned for comfort      Mobility  Bed Mobility Bed Mobility: Supine to Sit;Sitting - Scoot to Edge of Bed Supine to Sit: 4: Min assist;HOB flat;With rails Sitting - Scoot to Edge of Bed: 4: Min assist Details for Bed Mobility Assistance: (A) to bring hips to EOB with use of draw pad; pt c/o pain in Rt hip with mobility; max cues for hand placement and sequencing  Transfers Transfers: Sit to  Stand;Stand to Sit;Stand Pivot Transfers Sit to Stand: 3: Mod assist;From bed Stand to Sit: 3: Mod assist;To chair/3-in-1;With armrests;With upper extremity assist Stand Pivot Transfers: 3: Mod assist;From elevated surface;With armrests Details for Transfer Assistance: pt with constant posterior lean when standing; pt requires (A) to achieve upright standing position and control descent to chair; pt unsteady and demo decr safety awareness  Ambulation/Gait Ambulation/Gait Assistance: Not tested (comment) Stairs: No Wheelchair Mobility Wheelchair Mobility: No    Exercises General Exercises - Lower Extremity Ankle Circles/Pumps: AAROM;Both;10 reps;Supine   PT Diagnosis: Difficulty walking;Acute pain;Generalized weakness  PT Problem List: Decreased strength;Decreased activity tolerance;Decreased balance;Decreased mobility;Decreased knowledge of use of DME;Decreased safety awareness;Pain PT Treatment Interventions: DME instruction;Gait training;Functional mobility training;Therapeutic activities;Therapeutic exercise;Balance training;Neuromuscular re-education;Patient/family education     PT Goals(Current goals can be found in the care plan section) Acute Rehab PT Goals Patient Stated Goal: to go home and get a caregiver PT Goal Formulation: With patient Potential to Achieve Goals: Fair  Visit Information  Last PT Received On: 10/08/13 Assistance Needed: +2 (when ambulating for safety ) History of Present Illness: Pt is a 79 y.o. female adm secondary to multiple recurrent falls; pt with PMH UTIs        Prior Functioning  Home Living Family/patient expects to be discharged to:: Assisted living Home Equipment: Walker - 2 wheels Additional Comments: per son; pt has to be able to ambulate at mod I level to return to ALF  Prior Function Level of Independence: Needs assistance Gait / Transfers Assistance Needed: pt was ambulating with [facility] in  [facility]F  ADL's / Homemaking Assistance  Needed: Pt requries (A) for bathing and dressing  Communication Communication: HOH    Cognition  Cognition Arousal/Alertness: Awake/alert Behavior During Therapy: Anxious;Impulsive Overall Cognitive Status: History of cognitive impairments - at baseline Memory: Decreased short-term memory    Extremity/Trunk Assessment Upper Extremity Assessment Upper Extremity Assessment: Defer to OT evaluation Lower Extremity Assessment Lower Extremity Assessment: Generalized weakness Cervical / Trunk Assessment Cervical / Trunk Assessment: Normal   Balance Balance Balance Assessed: Yes Static Sitting Balance Static Sitting - Balance Support: Bilateral upper extremity supported;Feet unsupported Static Sitting - Level of Assistance: 5: Stand by assistance;4: Min assist Static Sitting - Comment/# of Minutes: pt tends to lose balance posteriorly spontaneously; cues for upright posture; tolerated sitting EOB ~3 min to prepare for transfers   End of Session PT - End of Session Equipment Utilized During Treatment: Gait belt;Other (comment) (Rt ankle brace ) Activity Tolerance: Patient tolerated treatment well Patient left: in chair;with call bell/phone within reach;with chair alarm set;with family/visitor present Nurse Communication: Mobility status  GP     Donell Sievert, [place] [phone] 10/08/2013, 2:14 PM

## 2013-10-08 NOTE — Progress Notes (Signed)
Orthopedic Tech Progress Note Patient Details:  Tasha Ball October 04, 1924 [id number] Right ankle brace applied to LE. Application tolerated well.  Ortho Devices Type of Ortho Device: Ankle splint Ortho Device/Splint Location: Right LE Ortho Device/Splint Interventions: Application   Asia R Thompson 10/08/2013, 11:59 AM

## 2013-10-08 NOTE — Progress Notes (Signed)
[facility]                                                                                Patient Demographics  Tasha Ball, is a 79 y.o. female, DOB - Oct 24, 1924, ZOX:[id number]  Admit date - 10/07/2013   Admitting Physician Therisa Doyne, MD  Outpatient Primary MD for the patient is Ezequiel Kayser, MD  LOS - 1   Chief Complaint  Patient presents with  . Leg Pain        Assessment & Plan    1. Fall-Pelvic Fracture with R.Ankle Injury - discussed case with orthopedic physician on call Dr. supple over the phone, supportive care with right ankle brace which will be ordered, pain control, PT eval, may require placement as fall risk remains high. And is having difficulty in ambulation.     2. History of atrial fibrillation. Continue beta blocker, on Coumadin which pharmacy is monitoring. Will defer long-term maintenance of anticoagulation and risk versus benefit stratification to primary care physician and primary cardiologist. Goal will be rate controll.  Lab Results  Component Value Date   INR 1.75* 10/07/2013   INR 2.79* 11/03/2011   INR 2.99* 11/02/2011   PROTIME 24.0* 01/21/2007      3. Dementia. Mildly delirious, remains at risk for more delirium. Continue supportive care. Aricept to be continued.     4. Essential hypertension. In good control on present regimen which includes Norvasc, beta blocker and ACE inhibitor. Continue present dose and monitor.     5. Hypothyroidism continue Synthroid supplementation at present dose.     6. UTI. Monitor cultures. For now continue empiric Rocephin and gentle IV fluids.      Code Status: DNR  Family Communication:    Disposition Plan: TBD   Procedures CT pelvis   Consults  PT, S Work   Medications  Scheduled Meds: . amLODipine  2.5 mg Oral Daily  . cefTRIAXone (ROCEPHIN) IVPB 1 gram/50 mL D5W  1 g Intravenous Q24H  . Chlorhexidine Gluconate Cloth  6 each Topical Q0600  . docusate sodium  100  mg Oral BID  . donepezil  23 mg Oral QHS  . escitalopram  5 mg Oral Daily  . ferrous sulfate  325 mg Oral TID PC  . levothyroxine  75 mcg Oral QAC breakfast  . losartan  50 mg Oral Daily  . memantine  10 mg Oral BID  . metoprolol  50 mg Oral BID  . mirtazapine  15 mg Oral QHS  . mupirocin ointment  1 application Nasal BID  . potassium chloride  40 mEq Oral BID  . sodium chloride  3 mL Intravenous Q12H  . Warfarin - Pharmacist Dosing Inpatient   Does not apply q1800   Continuous Infusions: . sodium chloride     PRN Meds:.acetaminophen, HYDROcodone-acetaminophen, LORazepam, morphine injection, ondansetron (ZOFRAN) IV  DVT Prophylaxis  Coumadin  Lab Results  Component Value Date   INR 1.75* 10/07/2013   INR 2.79* 11/03/2011   INR 2.99* 11/02/2011   PROTIME 24.0* 01/21/2007      Lab Results  Component Value Date   PLT 193 10/08/2013    Antibiotics     Anti-infectives  Start     Dose/Rate Route Frequency Ordered Stop   10/08/13 0400  cefTRIAXone (ROCEPHIN) 1 g in dextrose 5 % 50 mL IVPB     1 g 100 mL/hr over 30 Minutes Intravenous Every 24 hours 10/08/13 0206            Subjective:   Sandia Eissler today has, No headache, No chest pain, No abdominal pain - No Nausea, No new weakness tingling or numbness, No Cough - SOB.    Objective:   Filed Vitals:   10/07/13 2259 10/08/13 0037 10/08/13 0139 10/08/13 0500  BP: 131/79 116/73 119/65 124/72  Pulse: 76 80 68 66  Temp:  98.8 F (37.1 C) 98.7 F (37.1 C) 98.6 F (37 C)  TempSrc:   Oral Oral  Resp: 16 18 18 18   Height:   5\' 2"  (1.575 m)   Weight:   51.347 kg (113 lb 3.2 oz)   SpO2: 93% 96% 96% 96%    Wt Readings from Last 3 Encounters:  10/08/13 51.347 kg (113 lb 3.2 oz)  10/28/11 48.943 kg (107 lb 14.4 oz)  10/26/11 48.898 kg (107 lb 12.8 oz)     Intake/Output Summary (Last 24 hours) at 10/08/13 0945 Last data filed at 10/08/13 0650  Gross per 24 hour  Intake 395.83 ml  Output      0 ml  Net 395.83 ml     Exam Awake Alert, Oriented X 1, No new F.N deficits, Normal affect [place].AT,PERRAL Supple Neck,No JVD, No cervical lymphadenopathy appriciated.  Symmetrical Chest wall movement, Good air movement bilaterally, CTAB RRR,No Gallops,Rubs or new Murmurs, No Parasternal Heave +ve B.Sounds, Abd Soft, Non tender, No organomegaly appriciated, No rebound - guarding or rigidity. No Cyanosis, Clubbing or edema, No new Rash or bruise    Data Review   Micro Results Recent Results (from the past 240 hour(s))  MRSA PCR SCREENING     Status: Abnormal   Collection Time    10/08/13  1:44 AM      Result Value Range Status   MRSA by PCR POSITIVE (*) NEGATIVE Final   Comment:            The GeneXpert MRSA Assay (FDA     approved for NASAL specimens     only), is one component of a     comprehensive MRSA colonization     surveillance program. It is not     intended to diagnose MRSA     infection nor to guide or     monitor treatment for     MRSA infections.     RESULT CALLED TO, READ BACK BY AND VERIFIED WITH:     ABERNATHY,V RN [id number] AT [id number] [facility]    Radiology Reports Dg Hip Complete Right  10/07/2013   CLINICAL DATA:  Fall, right hip pain  EXAM: RIGHT HIP - COMPLETE 2+ VIEW  COMPARISON:  None.  FINDINGS: Three views of the right hip submitted. No acute fracture or subluxation. There is diffuse osteopenia. Left hip prosthesis in anatomic alignment.  IMPRESSION: No right hip acute fracture or subluxation. Diffuse osteopenia.   Electronically Signed   By: Natasha Mead M.D.   On: 10/07/2013 18:26   Dg Ankle Complete Right  10/07/2013   CLINICAL DATA:  Pain after fall  EXAM: RIGHT ANKLE - COMPLETE 3+ VIEW  COMPARISON:  None.  FINDINGS: Mortise is intact. There is a tiny ossicle off of the medial malleolus. There is a very subtle crescent of bone  off the tip of the lateral malleolus. A very subtle avulsion injury cannot be excluded. There are no displaced fractures. There is a small joint effusion.   IMPRESSION: Possible tiny subtle avulsion injury off of the tip of the lateral malleolus. Small joint effusion.   Electronically Signed   By: Esperanza Heir M.D.   On: 10/07/2013 22:24   Ct Hip Right Wo Contrast  10/07/2013   CLINICAL DATA:  Status post falls; right hip pain.  EXAM: CT OF THE RIGHT HIP WITHOUT CONTRAST  TECHNIQUE: Multidetector CT imaging was performed according to the standard protocol. Multiplanar CT image reconstructions were also generated.  COMPARISON:  Right hip radiographs performed earlier today at 6:06 p.m  FINDINGS: There is a minimally displaced fracture along the medial aspect of the right superior and inferior pubic rami, extending to the posterior aspect of the pubic symphysis. No additional fractures are seen. The right hip joint is grossly unremarkable in appearance. The proximal right femur remains grossly intact.  No significant degenerative change is seen. The visualized portions of the right sacroiliac joints are within normal limits. Scattered calcification is noted along the right common femoral artery. There is no evidence of significant soft tissue injury. Mild diverticulosis is noted along the sigmoid colon. Minimal likely vascular calcification is seen about the uterus.  IMPRESSION: 1. Minimally displaced fracture along the medial aspect of the right superior and inferior pubic rami, extending to the posterior aspect of the pubic symphysis. 2. Scattered calcification along the right common femoral artery. 3. Mild diverticulosis along the sigmoid colon.   Electronically Signed   By: Roanna Raider M.D.   On: 10/07/2013 21:36   Dg Knee Complete 4 Views Right  10/07/2013   CLINICAL DATA:  Right knee pain.  EXAM: RIGHT KNEE - COMPLETE 4+ VIEW  COMPARISON:  None.  FINDINGS: There is no evidence of fracture or dislocation. Chondrocalcinosis noted, with calcification along the menisci. There is mild narrowing of the medial compartment. No significant degenerative change is  seen; the patellofemoral joint is grossly unremarkable in appearance.  No significant joint effusion is seen. The visualized soft tissues are normal in appearance.  IMPRESSION: 1. No evidence of fracture or dislocation. 2. Chondrocalcinosis noted; mild narrowing of the medial compartment.   Electronically Signed   By: Roanna Raider M.D.   On: 10/07/2013 23:03    CBC  Recent Labs Lab 10/07/13 2103 10/08/13 0640  WBC 10.1 8.3  HGB 11.6* 10.4*  HCT 36.2 31.7*  PLT 220 193  MCV 95.3 94.6  MCH 30.5 31.0  MCHC 32.0 32.8  RDW 14.3 14.6  LYMPHSABS 1.4  --   MONOABS 1.4*  --   EOSABS 0.1  --   BASOSABS 0.1  --     Chemistries   Recent Labs Lab 10/07/13 2103 10/08/13 0640  NA 137 138  K 3.9 3.3*  CL 98 101  CO2 29 27  GLUCOSE 114* 85  BUN 16 13  CREATININE 0.90 0.74  CALCIUM 9.2 8.2*  MG  --  1.4*  AST 24 17  ALT 12 10  ALKPHOS 161* 135*  BILITOT 1.5* 1.1   ------------------------------------------------------------------------------------------------------------------ estimated creatinine clearance is 39.2 ml/min (by C-G formula based on Cr of 0.74). ------------------------------------------------------------------------------------------------------------------ No results found for this basename: HGBA1C,  in the last 72 hours ------------------------------------------------------------------------------------------------------------------ No results found for this basename: CHOL, HDL, LDLCALC, TRIG, CHOLHDL, LDLDIRECT,  in the last 72 hours ------------------------------------------------------------------------------------------------------------------ No results found for this basename: TSH, T4TOTAL, FREET3, T3FREE,  THYROIDAB,  in the last 72 hours ------------------------------------------------------------------------------------------------------------------ No results found for this basename: VITAMINB12, FOLATE, FERRITIN, TIBC, IRON, RETICCTPCT,  in the last 72  hours  Coagulation profile  Recent Labs Lab 10/07/13 2103  INR 1.75*    No results found for this basename: DDIMER,  in the last 72 hours  Cardiac Enzymes No results found for this basename: CK, CKMB, TROPONINI, MYOGLOBIN,  in the last 168 hours ------------------------------------------------------------------------------------------------------------------ No components found with this basename: POCBNP,      Time Spent in minutes  35   Susa Raring K M.D on 10/08/2013 at 9:45 AM  Between 7am to 7pm - Pager - [phone]  After 7pm go to [email or web address] - password [email or web address]  And look for the night coverage person covering for me after hours  [facility] Office  [phone]

## 2013-10-08 NOTE — H&P (Signed)
PCP:  Ezequiel Kayser, MD    Chief Complaint:  Pain, unable to get up  HPI: Tasha Ball is a 79 y.o. female   has a past medical history of Urinary tract infection; Cancer; Lymphoma in remission; Dementia; Atrial fibrillation; Hypertension; Mitral valve prolapse; Thyroid disease; Memory loss; and Osteoporosis.   Presented with  Patient was brought from assisted living after having right thigh pain with hx of recent falls. She has hx of dementia and have had trouble providing her own hx. Patient was evaluated at [facility] and CT scan showed evidence of mild pubic ramus fracture. Patient was found to have UTI she is being admitted for pain control. Patient has dementia and sundowning she is unable to provide any history at this point.   Review of Systems:  Unable to obtain  Past Medical History: Past Medical History  Diagnosis Date  . Urinary tract infection   . Cancer     non-hodgkins lymphoma   . Lymphoma in remission     ca free 5 yrs  . Dementia   . Atrial fibrillation   . Hypertension   . Mitral valve prolapse   . Thyroid disease   . Memory loss   . Osteoporosis    Past Surgical History  Procedure Laterality Date  . Eye surgery      bil.cataract with lens implants  . Hip arthroplasty  10/18/2011    Procedure: ARTHROPLASTY BIPOLAR HIP;  Surgeon: [name];  Location: [facility];  Service: Orthopedics;  Laterality: Left;  depuy     Medications: Prior to Admission medications   Medication Sig Start Date End Date Taking? Authorizing Provider  alendronate (FOSAMAX) 70 MG tablet Take 70 mg by mouth every 7 (seven) days. Take with a full glass of water on an empty stomach.   Yes Historical Provider, MD  amLODipine (NORVASC) 5 MG tablet Take 2.5 mg by mouth daily.    Yes Historical Provider, MD  b complex vitamins tablet Take 1 tablet by mouth daily.   Yes Historical Provider, MD  donepezil (ARICEPT) 23 MG TABS tablet Take 23 mg by mouth at bedtime.     Yes Historical Provider,  MD  escitalopram (LEXAPRO) 10 MG tablet Take 5 mg by mouth daily.     Yes Historical Provider, MD  LEVOXYL 75 MCG tablet TAKE 1 TABLET BY MOUTH EVERY DAY 04/30/12  Yes Josph Macho, MD  losartan (COZAAR) 100 MG tablet Take 50 mg by mouth daily.    Yes Historical Provider, MD  memantine (NAMENDA) 10 MG tablet Take 10 mg by mouth 2 (two) times daily.     Yes Historical Provider, MD  metoprolol (LOPRESSOR) 50 MG tablet Take 50 mg by mouth 2 (two) times daily.   Yes Historical Provider, MD  mirtazapine (REMERON) 15 MG tablet Take 15 mg by mouth at bedtime.   Yes Historical Provider, MD  silver sulfADIAZINE (SILVADENE) 1 % cream Apply 1 application topically 2 (two) times daily.   Yes Historical Provider, MD  Skin Protectants, Misc. (BAZA PROTECT EX) Apply topically.   Yes Historical Provider, MD  Vitamin D, Ergocalciferol, (DRISDOL) 50000 UNITS CAPS Take 50,000 Units by mouth every 7 (seven) days.     Yes Historical Provider, MD  warfarin (COUMADIN) 2.5 MG tablet Take 1 tablet (2.5 mg total) by mouth daily. 11/02/11 10/07/13 Yes Daniel J Angiulli, PA-C  ferrous sulfate 325 (65 FE) MG tablet Take 1 tablet (325 mg total) by mouth 3 (three) times daily after  meals. 11/02/11 11/01/12  Mcarthur Rossetti Angiulli, PA-C  Flora-[facility]) CAPS Take 1 capsule by mouth daily. 11/02/11   Mcarthur Rossetti Angiulli, PA-C  traMADol (ULTRAM) 50 MG tablet Take 1 tablet (50 mg total) by mouth every 6 (six) hours as needed. 10/07/13   Geoffery Lyons, MD    Allergies:  No Known Allergies  Social History:  Ambulatory   Lives at assisted living   reports that she has never smoked. She has never used smokeless tobacco. She reports that she does not drink alcohol or use illicit drugs.   Family History: family history includes Angina in her father; Coronary artery disease in her brother; Heart disease in her father; Stomach cancer (age of onset: 86) in her brother.    Physical Exam: Patient Vitals for the past 24 hrs:  BP Temp Temp  src Pulse Resp SpO2 Height Weight  10/08/13 0500 124/72 mmHg 98.6 F (37 C) Oral 66 18 96 % - -  10/08/13 0139 119/65 mmHg 98.7 F (37.1 C) Oral 68 18 96 % 5\' 2"  (1.575 m) 51.347 kg (113 lb 3.2 oz)  10/08/13 0037 116/73 mmHg 98.8 F (37.1 C) - 80 18 96 % - -  10/07/13 2259 131/79 mmHg - - 76 16 93 % - -  10/07/13 2057 122/75 mmHg - - 79 - 93 % - -  10/07/13 1914 130/83 mmHg - - 82 16 96 % - -  10/07/13 1911 130/83 mmHg - - 84 - 94 % - -  10/07/13 1713 137/78 mmHg 98.7 F (37.1 C) Oral 95 14 94 % - -    1. General:  in No Acute distress 2. Psychological: Alert but not Oriented, agitated, reaping off her leads and IV.  3. Head/ENT:   Moist  Mucous Membranes                          Head Non traumatic, neck supple                          Normal  Dentition 4. SKIN:  decreased Skin turgor,  Skin clean Dry and intact no rash 5. Heart: irregular rate and rhythm no Murmur, Rub or gallop 6. Lungs: Clear to auscultation bilaterally, no wheezes or crackles   7. Abdomen: Soft, non-tender, Non distended 8. Lower extremities: no clubbing, cyanosis, or edema 9. Neurologically Grossly intact, moving all 4 extremities equally 10. MSK: Normal range of motion  body mass index is 20.7 kg/(m^2).   Labs on Admission:   Recent Labs  10/07/13 2103  NA 137  K 3.9  CL 98  CO2 29  GLUCOSE 114*  BUN 16  CREATININE 0.90  CALCIUM 9.2    Recent Labs  10/07/13 2103  AST 24  ALT 12  ALKPHOS 161*  BILITOT 1.5*  PROT 6.7  ALBUMIN 3.2*   No results found for this basename: LIPASE, AMYLASE,  in the last 72 hours  Recent Labs  10/07/13 2103  WBC 10.1  NEUTROABS 7.2  HGB 11.6*  HCT 36.2  MCV 95.3  PLT 220   No results found for this basename: CKTOTAL, CKMB, CKMBINDEX, TROPONINI,  in the last 72 hours No results found for this basename: TSH, T4TOTAL, FREET3, T3FREE, THYROIDAB,  in the last 72 hours No results found for this basename: VITAMINB12, FOLATE, FERRITIN, TIBC, IRON,  RETICCTPCT,  in the last 72 hours No results found for this basename: HGBA1C  Estimated Creatinine Clearance: 34.8 ml/min (by C-G formula based on Cr of 0.9). ABG No results found for this basename: phart, pco2, po2, hco3, tco2, acidbasedef, o2sat     No results found for this basename: DDIMER     UA evidence of UTI  Cultures:    Component Value Date/Time   SDES URINE, CATHETERIZED 12/20/2012 1728   SPECREQUEST NONE 12/20/2012 1728   CULT NO GROWTH 12/20/2012 1728   REPTSTATUS 12/22/2012 FINAL 12/20/2012 1728       Radiological Exams on Admission: Dg Hip Complete Right  10/07/2013   CLINICAL DATA:  Fall, right hip pain  EXAM: RIGHT HIP - COMPLETE 2+ VIEW  COMPARISON:  None.  FINDINGS: Three views of the right hip submitted. No acute fracture or subluxation. There is diffuse osteopenia. Left hip prosthesis in anatomic alignment.  IMPRESSION: No right hip acute fracture or subluxation. Diffuse osteopenia.   Electronically Signed   By: Natasha Mead M.D.   On: 10/07/2013 18:26   Dg Ankle Complete Right  10/07/2013   CLINICAL DATA:  Pain after fall  EXAM: RIGHT ANKLE - COMPLETE 3+ VIEW  COMPARISON:  None.  FINDINGS: Mortise is intact. There is a tiny ossicle off of the medial malleolus. There is a very subtle crescent of bone off the tip of the lateral malleolus. A very subtle avulsion injury cannot be excluded. There are no displaced fractures. There is a small joint effusion.  IMPRESSION: Possible tiny subtle avulsion injury off of the tip of the lateral malleolus. Small joint effusion.   Electronically Signed   By: Esperanza Heir M.D.   On: 10/07/2013 22:24   Ct Hip Right Wo Contrast  10/07/2013   CLINICAL DATA:  Status post falls; right hip pain.  EXAM: CT OF THE RIGHT HIP WITHOUT CONTRAST  TECHNIQUE: Multidetector CT imaging was performed according to the standard protocol. Multiplanar CT image reconstructions were also generated.  COMPARISON:  Right hip radiographs performed earlier  today at 6:06 p.m  FINDINGS: There is a minimally displaced fracture along the medial aspect of the right superior and inferior pubic rami, extending to the posterior aspect of the pubic symphysis. No additional fractures are seen. The right hip joint is grossly unremarkable in appearance. The proximal right femur remains grossly intact.  No significant degenerative change is seen. The visualized portions of the right sacroiliac joints are within normal limits. Scattered calcification is noted along the right common femoral artery. There is no evidence of significant soft tissue injury. Mild diverticulosis is noted along the sigmoid colon. Minimal likely vascular calcification is seen about the uterus.  IMPRESSION: 1. Minimally displaced fracture along the medial aspect of the right superior and inferior pubic rami, extending to the posterior aspect of the pubic symphysis. 2. Scattered calcification along the right common femoral artery. 3. Mild diverticulosis along the sigmoid colon.   Electronically Signed   By: Roanna Raider M.D.   On: 10/07/2013 21:36   Dg Knee Complete 4 Views Right  10/07/2013   CLINICAL DATA:  Right knee pain.  EXAM: RIGHT KNEE - COMPLETE 4+ VIEW  COMPARISON:  None.  FINDINGS: There is no evidence of fracture or dislocation. Chondrocalcinosis noted, with calcification along the menisci. There is mild narrowing of the medial compartment. No significant degenerative change is seen; the patellofemoral joint is grossly unremarkable in appearance.  No significant joint effusion is seen. The visualized soft tissues are normal in appearance.  IMPRESSION: 1. No evidence of fracture or dislocation. 2. Chondrocalcinosis  noted; mild narrowing of the medial compartment.   Electronically Signed   By: Roanna Raider M.D.   On: 10/07/2013 23:03    Chart has been reviewed  Assessment/Plan  79 yo F with dementia here with UTI, confusion and pubic rami fracture  Present on Admission:  . Urinary  tract infection - treat with rocephin await results of urine culture . Dementia - continue Remeron, Aricept and nemenda. Ativan prn aggitation . Atrial fibrillation - continue coumadin per pharmacy and metoprolol . HTN (hypertension) - continue homemedications   Prophylaxis: coumadin   CODE STATUS: DNR/DNI as per her reccords  Other plan as per orders.  I have spent a total of 65 min on this admission, time taken to comfort agitated patinet  Merland Holness 10/08/2013, 5:58 AM

## 2013-10-09 LAB — PROTIME-INR
INR: 2.31 — ABNORMAL HIGH (ref 0.00–1.49)
Prothrombin Time: 24.6 seconds — ABNORMAL HIGH (ref 11.6–15.2)

## 2013-10-09 LAB — POTASSIUM: Potassium: 4.4 mEq/L (ref 3.5–5.1)

## 2013-10-09 MED ORDER — POTASSIUM CHLORIDE CRYS ER 20 MEQ PO TBCR
40.0000 meq | EXTENDED_RELEASE_TABLET | Freq: Once | ORAL | Status: AC
  Administered 2013-10-09: 09:00:00 40 meq via ORAL
  Filled 2013-10-09: qty 2

## 2013-10-09 MED ORDER — WARFARIN SODIUM 2.5 MG PO TABS
2.5000 mg | ORAL_TABLET | Freq: Every day | ORAL | Status: DC
  Administered 2013-10-09 – 2013-10-10 (×2): 2.5 mg via ORAL
  Filled 2013-10-09 (×3): qty 1

## 2013-10-09 NOTE — Progress Notes (Signed)
[facility]                                                                                Patient Demographics  Tasha Ball, is a 79 y.o. female, DOB - October 31, 1924, ZOX:[id number]  Admit date - 10/07/2013   Admitting Physician Therisa Doyne, MD  Outpatient Primary MD for the patient is Ezequiel Kayser, MD  LOS - 2   Chief Complaint  Patient presents with  . Leg Pain        Assessment & Plan    1. Fall-Pelvic Fracture with R.Ankle Injury - discussed case with orthopedic physician on call Dr. supple over the phone, supportive care with right ankle brace which will be ordered, pain control, PT eval, may require placement as fall risk remains high. And is having difficulty in ambulation.   2. History of atrial fibrillation. Continue beta blocker, on Coumadin which pharmacy is monitoring. Will defer long-term maintenance of anticoagulation and risk versus benefit stratification to primary care physician and primary cardiologist. Goal will be rate control.  Lab Results  Component Value Date   INR 2.31* 10/09/2013   INR 1.75* 10/07/2013   INR 2.79* 11/03/2011   PROTIME 24.0* 01/21/2007     3. Dementia. Alert and orientated at this point.  Mildly confused. remains at risk for more delirium. Continue supportive care. Aricept to be continued.    4. Essential hypertension. In good control on present regimen which includes Norvasc, beta blocker and ACE inhibitor. Continue present dose and monitor.    5. Hypothyroidism continue Synthroid supplementation at present dose.    6. UTI. Monitor cultures. For now continue empiric Rocephin.  Will d/c IVF.      Code Status: DNR  Family Communication:    Disposition Plan: SNF when appropriate   Procedures CT pelvis   Consults  PT, S Work   Medications  Scheduled Meds: . amLODipine  2.5 mg Oral Daily  . cefTRIAXone (ROCEPHIN) IVPB 1 gram/50 mL D5W  1 g Intravenous Q24H  . Chlorhexidine Gluconate Cloth  6 each  Topical Q0600  . docusate sodium  100 mg Oral BID  . donepezil  23 mg Oral QHS  . escitalopram  5 mg Oral Daily  . ferrous sulfate  325 mg Oral TID PC  . levothyroxine  75 mcg Oral QAC breakfast  . losartan  50 mg Oral Daily  . memantine  10 mg Oral BID  . metoprolol  50 mg Oral BID  . mirtazapine  15 mg Oral QHS  . mupirocin ointment  1 application Nasal BID  . sodium chloride  3 mL Intravenous Q12H  . Warfarin - Pharmacist Dosing Inpatient   Does not apply q1800   Continuous Infusions:   PRN Meds:.acetaminophen, HYDROcodone-acetaminophen, LORazepam, morphine injection, ondansetron (ZOFRAN) IV  DVT Prophylaxis  Coumadin  Lab Results  Component Value Date   INR 2.31* 10/09/2013   INR 1.75* 10/07/2013   INR 2.79* 11/03/2011   PROTIME 24.0* 01/21/2007      Lab Results  Component Value Date   PLT 193 10/08/2013    Antibiotics     Anti-infectives   Start     Dose/Rate Route Frequency Ordered Stop  10/08/13 0400  cefTRIAXone (ROCEPHIN) 1 g in dextrose 5 % 50 mL IVPB     1 g 100 mL/hr over 30 Minutes Intravenous Every 24 hours 10/08/13 0206            Subjective:   Tasha Ball today has, No headache, No chest pain, No abdominal pain - No Nausea, No new weakness tingling or numbness, No Cough - SOB.   She is very pleasant.  Repeatedly asking "Is Nedra Hai coming?"  Objective:   Filed Vitals:   10/08/13 1516 10/08/13 2119 10/09/13 0540 10/09/13 0747  BP: 120/75 121/75 126/68 124/72  Pulse: 78 87 76 72  Temp: 98.9 F (37.2 C) 98.8 F (37.1 C) 98 F (36.7 C) 97.9 F (36.6 C)  TempSrc: Oral Oral Oral Oral  Resp: 16 18 18 18   Height:      Weight:      SpO2: 97% 94% 96% 95%    Wt Readings from Last 3 Encounters:  10/08/13 51.347 kg (113 lb 3.2 oz)  10/28/11 48.943 kg (107 lb 14.4 oz)  10/26/11 48.898 kg (107 lb 12.8 oz)     Intake/Output Summary (Last 24 hours) at 10/09/13 1001 Last data filed at 10/09/13 0519  Gross per 24 hour  Intake 1124.17 ml  Output     650 ml  Net 474.17 ml    Exam Awake Alert, Oriented X 1, No new F.N deficits, Normal affect.  Very pleasant [place].AT,PERRAL Supple Neck,No JVD, No cervical lymphadenopathy appriciated.  Symmetrical Chest wall movement, Good air movement bilaterally, CTAB RRR,No Gallops,Rubs or new Murmurs, No Parasternal Heave +ve B.Sounds, Abd Soft, Non tender, No organomegaly appriciated, No rebound - guarding or rigidity. No Cyanosis, Clubbing or edema, No new Rash or bruise    Data Review   Micro Results Recent Results (from the past 240 hour(s))  MRSA PCR SCREENING     Status: Abnormal   Collection Time    10/08/13  1:44 AM      Result Value Range Status   MRSA by PCR POSITIVE (*) NEGATIVE Final   Comment:            The GeneXpert MRSA Assay (FDA     approved for NASAL specimens     only), is one component of a     comprehensive MRSA colonization     surveillance program. It is not     intended to diagnose MRSA     infection nor to guide or     monitor treatment for     MRSA infections.     RESULT CALLED TO, READ BACK BY AND VERIFIED WITH:     ABERNATHY,V RN [id number] AT [phone] [facility]    Radiology Reports Dg Hip Complete Right  10/07/2013   CLINICAL DATA:  Fall, right hip pain  EXAM: RIGHT HIP - COMPLETE 2+ VIEW  COMPARISON:  None.  FINDINGS: Three views of the right hip submitted. No acute fracture or subluxation. There is diffuse osteopenia. Left hip prosthesis in anatomic alignment.  IMPRESSION: No right hip acute fracture or subluxation. Diffuse osteopenia.   Electronically Signed   By: Natasha Mead M.D.   On: 10/07/2013 18:26   Dg Ankle Complete Right  10/07/2013   CLINICAL DATA:  Pain after fall  EXAM: RIGHT ANKLE - COMPLETE 3+ VIEW  COMPARISON:  None.  FINDINGS: Mortise is intact. There is a tiny ossicle off of the medial malleolus. There is a very subtle crescent of bone off the tip of the lateral  malleolus. A very subtle avulsion injury cannot be excluded. There are no displaced  fractures. There is a small joint effusion.  IMPRESSION: Possible tiny subtle avulsion injury off of the tip of the lateral malleolus. Small joint effusion.   Electronically Signed   By: Esperanza Heir M.D.   On: 10/07/2013 22:24   Ct Hip Right Wo Contrast  10/07/2013   CLINICAL DATA:  Status post falls; right hip pain.  EXAM: CT OF THE RIGHT HIP WITHOUT CONTRAST  TECHNIQUE: Multidetector CT imaging was performed according to the standard protocol. Multiplanar CT image reconstructions were also generated.  COMPARISON:  Right hip radiographs performed earlier today at 6:06 p.m  FINDINGS: There is a minimally displaced fracture along the medial aspect of the right superior and inferior pubic rami, extending to the posterior aspect of the pubic symphysis. No additional fractures are seen. The right hip joint is grossly unremarkable in appearance. The proximal right femur remains grossly intact.  No significant degenerative change is seen. The visualized portions of the right sacroiliac joints are within normal limits. Scattered calcification is noted along the right common femoral artery. There is no evidence of significant soft tissue injury. Mild diverticulosis is noted along the sigmoid colon. Minimal likely vascular calcification is seen about the uterus.  IMPRESSION: 1. Minimally displaced fracture along the medial aspect of the right superior and inferior pubic rami, extending to the posterior aspect of the pubic symphysis. 2. Scattered calcification along the right common femoral artery. 3. Mild diverticulosis along the sigmoid colon.   Electronically Signed   By: Roanna Raider M.D.   On: 10/07/2013 21:36   Dg Knee Complete 4 Views Right  10/07/2013   CLINICAL DATA:  Right knee pain.  EXAM: RIGHT KNEE - COMPLETE 4+ VIEW  COMPARISON:  None.  FINDINGS: There is no evidence of fracture or dislocation. Chondrocalcinosis noted, with calcification along the menisci. There is mild narrowing of the medial  compartment. No significant degenerative change is seen; the patellofemoral joint is grossly unremarkable in appearance.  No significant joint effusion is seen. The visualized soft tissues are normal in appearance.  IMPRESSION: 1. No evidence of fracture or dislocation. 2. Chondrocalcinosis noted; mild narrowing of the medial compartment.   Electronically Signed   By: Roanna Raider M.D.   On: 10/07/2013 23:03    CBC  Recent Labs Lab 10/07/13 2103 10/08/13 0640  WBC 10.1 8.3  HGB 11.6* 10.4*  HCT 36.2 31.7*  PLT 220 193  MCV 95.3 94.6  MCH 30.5 31.0  MCHC 32.0 32.8  RDW 14.3 14.6  LYMPHSABS 1.4  --   MONOABS 1.4*  --   EOSABS 0.1  --   BASOSABS 0.1  --     Chemistries   Recent Labs Lab 10/07/13 2103 10/08/13 0640 10/09/13 0620  NA 137 138  --   K 3.9 3.3* 4.4  CL 98 101  --   CO2 29 27  --   GLUCOSE 114* 85  --   BUN 16 13  --   CREATININE 0.90 0.74  --   CALCIUM 9.2 8.2*  --   MG  --  1.4*  --   AST 24 17  --   ALT 12 10  --   ALKPHOS 161* 135*  --   BILITOT 1.5* 1.1  --    ------------------------------------------------------------------------------------------------------------------ estimated creatinine clearance is 39.2 ml/min (by C-G formula based on Cr of 0.74). ------------------------------------------------------------------------------------------------------------------ No results found for this basename: HGBA1C,  in the  last 72 hours ------------------------------------------------------------------------------------------------------------------ No results found for this basename: CHOL, HDL, LDLCALC, TRIG, CHOLHDL, LDLDIRECT,  in the last 72 hours ------------------------------------------------------------------------------------------------------------------  Recent Labs  10/08/13 0640  TSH 2.652   ------------------------------------------------------------------------------------------------------------------ No results found for this  basename: VITAMINB12, FOLATE, FERRITIN, TIBC, IRON, RETICCTPCT,  in the last 72 hours  Coagulation profile  Recent Labs Lab 10/07/13 2103 10/09/13 0620  INR 1.75* 2.31*    [place], Daveah Varone L PA-C on 10/09/2013 at 10:01 AM  Between 7am to 7pm - Pager - [phone]  After 7pm go to [email or web address] - password [email or web address]  And look for the night coverage person covering for me after hours  [facility] Office  [phone]

## 2013-10-09 NOTE — Progress Notes (Signed)
  I have directly reviewed the clinical findings, lab, imaging studies and management of this patient in detail. I have interviewed and examined the patient and agree with the documentation,  as recorded by the Physician extender.  Leroy Sea M.D on 10/09/2013 at 10:32 AM  [facility] Office  [phone]

## 2013-10-09 NOTE — Progress Notes (Signed)
Nutrition Brief Note  Patient identified on the Malnutrition Screening Tool (MST) Report. Discussed nutrition hx with family at bedside. Pt with good appetite PTA and stable weight.  Wt Readings from Last 15 Encounters:  10/08/13 113 lb 3.2 oz (51.347 kg)  10/28/11 107 lb 14.4 oz (48.943 kg)  10/26/11 107 lb 12.8 oz (48.898 kg)  10/26/11 107 lb 12.8 oz (48.898 kg)    Body mass index is 20.7 kg/(m^2). Patient meets criteria for normal weight based on current BMI.   Current diet order is Heart Healthy, patient is consuming approximately 25-50% of meals at this time. Labs and medications reviewed.   No nutrition interventions warranted at this time. If nutrition issues arise, please consult RD.   Jarold Motto MS, RD, LDN Pager: [phone] After-hours pager: [phone]

## 2013-10-09 NOTE — Progress Notes (Signed)
ANTICOAGULATION CONSULT NOTE   Pharmacy Consult for Coumadin Indication: atrial fibrillation  No Known Allergies  Patient Measurements: Height: 5\' 2"  (157.5 cm) Weight: 113 lb 3.2 oz (51.347 kg) IBW/kg (Calculated) : 50.1  Vital Signs: Temp: 97.9 F (36.6 C) (11/10 0747) Temp src: Oral (11/10 0747) BP: 124/72 mmHg (11/10 0747) Pulse Rate: 72 (11/10 0747)  Labs:  Recent Labs  10/07/13 2103 10/08/13 0640 10/09/13 0620  HGB 11.6* 10.4*  --   HCT 36.2 31.7*  --   PLT 220 193  --   LABPROT 19.9*  --  24.6*  INR 1.75*  --  2.31*  CREATININE 0.90 0.74  --     Estimated Creatinine Clearance: 39.2 ml/min (by C-G formula based on Cr of 0.74).  Assessment: 79yo female c/o thigh pain x2d w/ swelling to both ankles, assisted living staff deny known injury, CT reveals minimally displaced fracture, admitted for pain control and to manage UTI, to continue Coumadin for Afib; INR currently slightly subtherapeutic on admission.  Patient has not received any doses of warfarin while admitted (missed dose on 11/8).    INR today = 2.31   Goal of Therapy:  INR 2-3   Plan:  1. Resume home dose of Coumadin - 2.5 mg po daily 2. Monitor daily INR  Thank you, Okey Regal, PharmD [phone]  10/09/2013 10:12 AM

## 2013-10-10 LAB — URINE CULTURE: Culture: >=100000

## 2013-10-10 MED ORDER — WARFARIN SODIUM 2.5 MG PO TABS: 2.5000 mg | ORAL_TABLET | Freq: Every day | ORAL | Status: DC

## 2013-10-10 MED ORDER — FERROUS SULFATE 325 (65 FE) MG PO TABS: 325.0000 mg | ORAL_TABLET | Freq: Two times a day (BID) | ORAL | Status: DC

## 2013-10-10 MED ORDER — CEFUROXIME AXETIL 500 MG PO TABS: 500.0000 mg | ORAL_TABLET | Freq: Two times a day (BID) | ORAL | Status: DC

## 2013-10-10 NOTE — Clinical Social Work Note (Signed)
CSW has made two calls and left two messages for son Machell Wirthlin (8:14AM, 10:22AM), in an attempt to assess for SNF placement. CSW unable to begin SNF search process without son's permission. CSW contacted [facility] to requested any additional contact information they may have.    Roddie Mc, [place], [place], [phone]

## 2013-10-10 NOTE — Progress Notes (Signed)
ANTICOAGULATION CONSULT NOTE   Pharmacy Consult for Coumadin Indication: atrial fibrillation  No Known Allergies  Patient Measurements: Height: 5\' 2"  (157.5 cm) Weight: 113 lb 3.2 oz (51.347 kg) IBW/kg (Calculated) : 50.1  Vital Signs: Temp: 98 F (36.7 C) (11/11 0440) Temp src: Oral (11/11 0440) BP: 137/71 mmHg (11/11 0639) Pulse Rate: 65 (11/11 0440)  Labs:  Recent Labs  10/07/13 2103 10/08/13 0640 10/09/13 0620 10/10/13 0610  HGB 11.6* 10.4*  --   --   HCT 36.2 31.7*  --   --   PLT 220 193  --   --   LABPROT 19.9*  --  24.6* 26.0*  INR 1.75*  --  2.31* 2.48*  CREATININE 0.90 0.74  --   --     Estimated Creatinine Clearance: 39.2 ml/min (by C-G formula based on Cr of 0.74).  Assessment: 79yo female c/o thigh pain x2d w/ swelling to both ankles, assisted living staff deny known injury, CT reveals minimally displaced fracture, admitted for pain control and to manage UTI, to continue Coumadin for Afib; INR currently slightly subtherapeutic on admission.  Patient has not received any doses of warfarin while admitted (missed dose on 11/8).    INR today = 2.48  Goal of Therapy:  INR 2-3   Plan:  1. Continue Coumadin 2.5 mg po daily 2. Monitor daily INR 3. Change to po antibiotics  Thank you, Okey Regal, PharmD [phone]  10/10/2013 8:53 AM

## 2013-10-10 NOTE — Clinical Social Work Placement (Signed)
Clinical Social Work Department CLINICAL SOCIAL WORK PLACEMENT NOTE 10/10/2013  Patient:  Tasha Ball, Tasha Ball  Account Number:  [id number] Admit date:  10/07/2013  Clinical Social Worker:  Cherre Blanc, [place]  Date/time:  10/10/2013 11:35 AM  Clinical Social Work is seeking post-discharge placement for this patient at the following level of care:   SKILLED NURSING   (*CSW will update this form in Epic as items are completed)   10/10/2013  Patient/family provided with [facility] Department of Clinical Social Work's list of facilities offering this level of care within the geographic area requested by the patient (or if unable, by the patient's family).  10/10/2013  Patient/family informed of their freedom to choose among providers that offer the needed level of care, that participate in Medicare, Medicaid or managed care program needed by the patient, have an available bed and are willing to accept the patient.  10/10/2013  Patient/family informed of [facility]' ownership interest in [facility], as well as of the fact that they are under no obligation to receive care at this facility.  PASARR submitted to EDS on 10/10/2013 PASARR number received from EDS on 10/10/2013  FL2 transmitted to all facilities in geographic area requested by pt/family on  10/10/2013 FL2 transmitted to all facilities within larger geographic area on 10/10/2013  Patient informed that his/her managed care company has contracts with or will negotiate with  certain facilities, including the following:     Patient/family informed of bed offers received:   Patient chooses bed at  Physician recommends and patient chooses bed at    Patient to be transferred to  on   Patient to be transferred to facility by   The following physician request were entered in Epic:   Additional Comments:   Roddie Mc, [place], [place], [phone]

## 2013-10-10 NOTE — Progress Notes (Signed)
Physical Therapy Treatment Patient Details Name: SENORA LACSON MRN: [id number] DOB: Dec 31, 1923 Today's Date: 10/10/2013 Time: 4098-1191 PT Time Calculation (min): 25 min  PT Assessment / Plan / Recommendation  History of Present Illness pt continues with dementia and pain with ambulation   PT Comments   Pt is able to ambulate some today, but continues to have pain.  She will benefit from continued PT at SNF  Follow Up Recommendations   SNF     Does the patient have the potential to tolerate intense rehabilitation     Barriers to Discharge        Equipment Recommendations       Recommendations for Other Services    Frequency     Progress towards PT Goals Progress towards PT goals: Progressing toward goals  Plan Current plan remains appropriate    Precautions / Restrictions Precautions Precautions: Fall Precaution Comments: multiple recent falls; dementia  Required Braces or Orthoses: Other Brace/Splint Other Brace/Splint: Rt ankle brace  Restrictions Weight Bearing Restrictions: No   Pertinent Vitals/Pain Pt with stepping and weight bearing    Mobility  Bed Mobility Bed Mobility: Supine to Sit;Sitting - Scoot to Edge of Bed Supine to Sit: 4: Min assist;With rails;HOB elevated Sitting - Scoot to [place] of Bed: 4: Min assist Details for Bed Mobility Assistance: assist to scoot due to uneveness of hospital mattress Transfers Transfers: Sit to Stand;Stand to [employer] Transfers Sit to Stand: From bed;4: Min assist;From chair/3-in-1;3: Mod assist Stand to Sit: To chair/3-in-1;With armrests;With upper extremity assist;4: Min assist;3: Mod assist Details for Transfer Assistance: pt able to repeat sit to stand several tmes for strengthening.  Pt with increased pain with increased repetitions and activity. decreased performance with increased pain Ambulation/Gait Ambulation/Gait Assistance: 4: Min assist Ambulation Distance (Feet): 15 Feet Assistive device: Rolling  walker Ambulation/Gait Assistance Details: encouragement to progress as pt initially limited by pain.  Gait Pattern: Antalgic;Wide base of support;Step-to pattern Gait velocity: decreased General Gait Details: Pt had painwith ambulation and was unable to take a second walk because of this Stairs: No Wheelchair Mobility Wheelchair Mobility: No    Exercises     PT Diagnosis:    PT Problem List:   PT Treatment Interventions:     PT Goals (current goals can now be found in the care plan section)    Visit Information  Last PT Received On: 10/10/13 Assistance Needed: +2 (safety) History of Present Illness: pt continues with dementia and pain with ambulation    Subjective Data      Cognition  Cognition Arousal/Alertness: Awake/alert Behavior During Therapy: Anxious;Impulsive Overall Cognitive Status: History of cognitive impairments - at baseline Memory: Decreased short-term memory    Balance  Balance Balance Assessed: Yes Static Sitting Balance Static Sitting - Balance Support: Bilateral upper extremity supported;Feet unsupported Static Sitting - Level of Assistance: 5: Stand by assistance Static Standing Balance Static Standing - Balance Support: Bilateral upper extremity supported Static Standing - Level of Assistance: 5: Stand by assistance Static Standing - Comment/# of Minutes: pt did not c/o pain with static standing, but pt increased with attmepts to step and shift weight  End of Session PT - End of Session Activity Tolerance: Patient tolerated treatment well Patient left: in chair;with chair alarm set;with call bell/phone within reach Nurse Communication: Mobility status   GP    Rosey Bath K. Manson Passey, [place] [phone] 10/10/2013, 11:25 AM

## 2013-10-10 NOTE — Clinical Social Work Psychosocial (Signed)
Clinical Social Work Department BRIEF PSYCHOSOCIAL ASSESSMENT 10/10/2013  Patient:  Tasha Ball, Tasha Ball     Account Number:  [id number]     Admit date:  10/07/2013  Clinical Social Worker:  Lavell Luster  Date/Time:  10/10/2013 11:21 AM  Referred by:  Physician  Date Referred:  10/10/2013 Referred for  SNF Placement   Other Referral:   Interview type:  Family Other interview type:   Patient's son Emonni Depasquale [phone] interviewed for assessment as patient is not oriented at this time.    PSYCHOSOCIAL DATA Living Status:  FACILITY Admitted from facility:  [facility] [address] Level of care:  Assisted Living Primary support name:  Findley Blankenbaker Primary support relationship to patient:  CHILD, ADULT Degree of support available:   Support is good.    CURRENT CONCERNS Current Concerns  Post-Acute Placement   Other Concerns:    SOCIAL WORK ASSESSMENT / PLAN CSW spoke with patient's son to discuss recommendation for SNF placement. Patient's son is agreeable to SNF placement in [facility]. Per son, patient has no previous SNF experience. Patient was admitted from [facility] and the plan is for patient to return once SNF stay is over.   Assessment/plan status:  Psychosocial Support/Ongoing Assessment of Needs Other assessment/ plan:   Complete FL2, Fax, Blue Medicare auth   Information/referral to community resources:   SNF list and CSW contact information left in patient's room.    PATIENT'S/FAMILY'S RESPONSE TO PLAN OF CARE: Son is agreeable to SNF placement in [place]. Son was pleasant and appreciative of CSW contact. Son awaits bed offers. CSW will continue to follow.       Roddie Mc, [place], [place], [phone]

## 2013-10-10 NOTE — Discharge Summary (Deleted)
Tasha Ball, is a 79 y.o. female  DOB [birth year]  MRN [id number].  Admission date:  10/07/2013  Admitting Physician  Therisa Doyne, MD  Discharge Date:  10/11/2013   Primary MD  Ezequiel Kayser, MD  Addendum:  Patient was medically stable for discharge on 11/11 but remained overnight awaiting SNF placement.  Recommendations for primary care physician for things to follow:   INR on 10/12/13  CBC in 1 week to monitor hgb./ anemia.  Closely monitor BP.  Medications were held on 11/12 due to soft pressure. At discharge metoprolol was decreased to 25 mg bid, amlodipine and Cozaar were discontinued.  These may need adjustment in the coming days.  Admission Diagnosis  Right thigh pain [729.5]  Discharge Diagnosis  Right pelvic fracture.  Principal Problem:   Pelvic fracture Active Problems:   Atrial fibrillation   Urinary tract infection   Dementia   HTN (hypertension)      Past Medical History  Diagnosis Date  . Urinary tract infection   . Cancer     non-hodgkins lymphoma   . Lymphoma in remission     ca free 5 yrs  . Dementia   . Atrial fibrillation   . Hypertension   . Mitral valve prolapse   . Thyroid disease   . Memory loss   . Osteoporosis     Past Surgical History  Procedure Laterality Date  . Eye surgery      bil.cataract with lens implants  . Hip arthroplasty  10/18/2011    Procedure: ARTHROPLASTY BIPOLAR HIP;  Surgeon: [name];  Location: [facility];  Service: Orthopedics;  Laterality: Left;  depuy     Discharge Condition:    Follow-up Information   Follow up with [facility]. (As needed if symptoms worsen)    Specialty:  Emergency Medicine   Contact information:   [address] [id number] [place] [address] [phone]       Discharge Medications      Medication List    STOP taking these medications       loperamide 2 MG capsule  Commonly  known as:  IMODIUM      TAKE these medications       alendronate 70 MG tablet  Commonly known as:  FOSAMAX  Take 70 mg by mouth every Friday. Take with a full glass of water on an empty stomach.     amLODipine 5 MG tablet  Commonly known as:  NORVASC  Take 2.5 mg by mouth daily.     ARICEPT 23 MG Tabs tablet  Generic drug:  donepezil  Take 23 mg by mouth at bedtime.     b complex vitamins capsule  Take 1 capsule by mouth daily.     BAZA PROTECT EX  Apply 1 application topically every morning.     bismuth subsalicylate 262 MG chewable tablet  Commonly known as:  PEPTO BISMOL  Chew 524 mg by mouth daily as needed.     cefUROXime 500 MG tablet  Commonly known as:  CEFTIN  Take 1 tablet (500 mg total) by mouth 2 (two) times daily with a meal.  Start taking on:  10/11/2013 and stop 10/14/13     escitalopram 10 MG tablet  Commonly known as:  LEXAPRO  Take 10 mg by mouth daily.     ferrous sulfate 325 (65 FE) MG tablet  Take 1 tablet (325 mg total) by mouth 2 (two) times daily with a  meal.     guaiFENesin-codeine 100-10 MG/5ML syrup  Commonly known as:  ROBITUSSIN AC  Take 5 mLs by mouth every 5 (five) hours as needed for cough.     levothyroxine 75 MCG tablet  Commonly known as:  SYNTHROID, LEVOTHROID  Take 75 mcg by mouth daily before breakfast.     losartan 100 MG tablet  Commonly known as:  COZAAR  Take 50 mg by mouth daily.     memantine 10 MG tablet  Commonly known as:  NAMENDA  Take 10 mg by mouth 2 (two) times daily.     metoprolol 50 MG tablet  Commonly known as:  LOPRESSOR  Take 50 mg by mouth 2 (two) times daily.     mirtazapine 15 MG tablet  Commonly known as:  REMERON  Take 15 mg by mouth at bedtime.     silver sulfADIAZINE 1 % cream  Commonly known as:  SILVADENE  Apply 1 application topically 2 (two) times daily.     traMADol 50 MG tablet  Commonly known as:  ULTRAM  Take 1 tablet (50 mg total) by mouth every 6 (six) hours as needed.      Vitamin D (Ergocalciferol) 50000 UNITS Caps capsule  Commonly known as:  DRISDOL  Take 50,000 Units by mouth every Friday.     warfarin 2.5 MG tablet  Commonly known as:  COUMADIN  Take 1 tablet (2.5 mg total) by mouth daily.         Diet and Activity recommendation: Heart Healthy Diet.  Activity per PT, no weight bearing restrictions.  Fall precautions.   Major procedures and Radiology Reports   Dg Hip Complete Right  10/07/2013   CLINICAL DATA:  Fall, right hip pain  EXAM: RIGHT HIP - COMPLETE 2+ VIEW  COMPARISON:  None.  FINDINGS: Three views of the right hip submitted. No acute fracture or subluxation. There is diffuse osteopenia. Left hip prosthesis in anatomic alignment.  IMPRESSION: No right hip acute fracture or subluxation. Diffuse osteopenia.   Electronically Signed   By: Natasha Mead M.D.   On: 10/07/2013 18:26   Dg Ankle Complete Right  10/07/2013   CLINICAL DATA:  Pain after fall  EXAM: RIGHT ANKLE - COMPLETE 3+ VIEW  COMPARISON:  None.  FINDINGS: Mortise is intact. There is a tiny ossicle off of the medial malleolus. There is a very subtle crescent of bone off the tip of the lateral malleolus. A very subtle avulsion injury cannot be excluded. There are no displaced fractures. There is a small joint effusion.  IMPRESSION: Possible tiny subtle avulsion injury off of the tip of the lateral malleolus. Small joint effusion.   Electronically Signed   By: Esperanza Heir M.D.   On: 10/07/2013 22:24   Ct Hip Right Wo Contrast  10/07/2013   CLINICAL DATA:  Status post falls; right hip pain.  EXAM: CT OF THE RIGHT HIP WITHOUT CONTRAST  TECHNIQUE: Multidetector CT imaging was performed according to the standard protocol. Multiplanar CT image reconstructions were also generated.  COMPARISON:  Right hip radiographs performed earlier today at 6:06 p.m  FINDINGS: There is a minimally displaced fracture along the medial aspect of the right superior and inferior pubic rami, extending to the  posterior aspect of the pubic symphysis. No additional fractures are seen. The right hip joint is grossly unremarkable in appearance. The proximal right femur remains grossly intact.  No significant degenerative change is seen. The visualized portions of the right sacroiliac joints are within  normal limits. Scattered calcification is noted along the right common femoral artery. There is no evidence of significant soft tissue injury. Mild diverticulosis is noted along the sigmoid colon. Minimal likely vascular calcification is seen about the uterus.  IMPRESSION: 1. Minimally displaced fracture along the medial aspect of the right superior and inferior pubic rami, extending to the posterior aspect of the pubic symphysis. 2. Scattered calcification along the right common femoral artery. 3. Mild diverticulosis along the sigmoid colon.   Electronically Signed   By: Roanna Raider M.D.   On: 10/07/2013 21:36   Dg Knee Complete 4 Views Right  10/07/2013   CLINICAL DATA:  Right knee pain.  EXAM: RIGHT KNEE - COMPLETE 4+ VIEW  COMPARISON:  None.  FINDINGS: There is no evidence of fracture or dislocation. Chondrocalcinosis noted, with calcification along the menisci. There is mild narrowing of the medial compartment. No significant degenerative change is seen; the patellofemoral joint is grossly unremarkable in appearance.  No significant joint effusion is seen. The visualized soft tissues are normal in appearance.  IMPRESSION: 1. No evidence of fracture or dislocation. 2. Chondrocalcinosis noted; mild narrowing of the medial compartment.   Electronically Signed   By: Roanna Raider M.D.   On: 10/07/2013 23:03    Micro Results    Recent Results (from the past 240 hour(s))  URINE CULTURE     Status: None   Collection Time    10/07/13 11:34 PM      Result Value Range Status   Specimen Description URINE, CLEAN CATCH   Final   Special Requests NONE   Final   Culture  Setup Time     Final   Value: 10/08/2013 16:08      Performed at [facility]   Culture     Final   Value: >=100,000 COLONIES/mL ESCHERICHIA COLI     Performed at [facility]   Report Status 10/10/2013 FINAL   Final   Organism ID, Bacteria ESCHERICHIA COLI   Final  MRSA PCR SCREENING     Status: Abnormal   Collection Time    10/08/13  1:44 AM      Result Value Range Status   MRSA by PCR POSITIVE (*) NEGATIVE Final   Comment:            The GeneXpert MRSA Assay (FDA     approved for NASAL specimens     only), is one component of a     comprehensive MRSA colonization     surveillance program. It is not     intended to diagnose MRSA     infection nor to guide or     monitor treatment for     MRSA infections.     RESULT CALLED TO, READ BACK BY AND VERIFIED WITH:     ABERNATHY,V RN [phone] AT [phone] SKEEN,P     History of present illness at the time of admission and  Hospital Course:     Tasha Ball is a 79 y.o. female with a past medical history of Urinary tract infection; Cancer; Lymphoma in remission; Dementia; Atrial fibrillation; Hypertension; Mitral valve prolapse; Thyroid disease; Memory loss; and Osteoporosis.  She presented from assisted living after having right thigh pain with hx of recent falls. She has hx of dementia and had trouble providing her own hx. Patient was evaluated at [facility] and CT scan showed evidence of mild pubic ramus fracture. Patient was found to have UTI she is being admitted for treatment  and pain control.     1. Fall-Pelvic Fracture with R.Ankle Injury - discussed case with orthopedic physician on call Dr. Rennis Chris over the phone, supportive care with right ankle brace was employed as an inpatient.  Pain control.  Physical Therapy evaluation was completed and recommended. SNF.  The patient will be discharged to a skilled nursing facility when a bed is available.    2. History of atrial fibrillation. Rate controlled.  Continue beta blocker, on Coumadin which pharmacy has monitored as an  inpatient.  Will defer long-term maintenance of anticoagulation and risk versus benefit stratification to primary care physician and primary cardiologist.   INR was 2.11 on 11/12.  Lab Results   Component  Value  Date    INR  2.31*  10/09/2013    INR  1.75*  10/07/2013    INR  2.79*  11/03/2011    PROTIME  24.0*  01/21/2007      3. Dementia. Alert and orientated at this point. Mildly confused. Very pleasant.  remains at risk for more delirium. Continue supportive care. Aricept and Namenda have been continued.     4. Essential hypertension. Patient's BP medications have been decreased.  Metoprolol dose has been decreased from 50 mg bid >> 25 mg bid.  Cozaar and amlodipine have been discontinued.  We request that her BP be closely monitored at the skilled nursing facility and BP med be adjusted as appropriate.    5. Hypothyroidism.  Recommend continue Synthroid supplementation at present dose.   Lab Results  Component Value Date   TSH 2.652 10/08/2013     6. UTI. E-coli, pan sensitive.  Treated with Rocephin for 4 days.  Will be discharged on Ceftin for another 2 days.         Today   Subjective:   Tanikka Guynes in bed, no headache, no chest-abd pain, no SOB  Objective:   Blood pressure 106/70, pulse 77, temperature 97.5 F (36.4 C), temperature source Oral, resp. rate 16, height 5\' 2"  (1.575 m), weight 51.347 kg (113 lb 3.2 oz), SpO2 94.00%.   Intake/Output Summary (Last 24 hours) at 10/11/13 1208 Last data filed at 10/10/13 1700  Gross per 24 hour  Intake    240 ml  Output      0 ml  Net    240 ml    Exam Awake Alert, Oriented to person and place, No new F.N deficits, Normal affect. Very pleasant, mildly confused. [place].AT,PERRAL  Supple Neck,No JVD, No cervical lymphadenopathy appreciated.  Symmetrical Chest wall movement, Good air movement bilaterally, CTAB  RRR,No Gallops,Rubs or new Murmurs, No Parasternal Heave  +ve B.Sounds, Abd Soft, slightly protuberant,  Non tender, No organomegaly appreciated, No rebound - guarding or rigidity.  No Cyanosis, Clubbing or edema, No new Rash or bruise    Data Review   CBC w Diff: Lab Results  Component Value Date   WBC 8.3 10/08/2013   WBC 7.4 11/03/2010   WBC 5.6 03/15/2008   HGB 10.4* 10/08/2013   HGB 13.9 11/03/2010   HGB 13.7 03/15/2008   HCT 31.7* 10/08/2013   HCT 41.9 11/03/2010   HCT 38.9 03/15/2008   PLT 193 10/08/2013   PLT 271 11/03/2010   PLT 210 03/15/2008   LYMPHOPCT 13 10/07/2013   LYMPHOPCT 18.6 11/03/2010   LYMPHOPCT 27.7 03/15/2008   MONOPCT 14* 10/07/2013   MONOPCT 8.3 11/03/2010   MONOPCT 11.5 03/15/2008   EOSPCT 1 10/07/2013   EOSPCT 1.6 11/03/2010   EOSPCT 1.9 03/15/2008  BASOPCT 1 10/07/2013   BASOPCT 0.6 11/03/2010   BASOPCT 0.9 03/15/2008    CMP: Lab Results  Component Value Date   NA 138 10/08/2013   K 4.4 10/09/2013   CL 101 10/08/2013   CO2 27 10/08/2013   BUN 13 10/08/2013   CREATININE 0.74 10/08/2013   PROT 5.6* 10/08/2013   ALBUMIN 2.6* 10/08/2013   BILITOT 1.1 10/08/2013   ALKPHOS 135* 10/08/2013   AST 17 10/08/2013   ALT 10 10/08/2013  .   Total Time in preparing paper work, data evaluation and todays exam - 60 minutes  Algis Downs, [place] [facility] Pager: [phone] 10/10/13   [facility] Office  [phone]

## 2013-10-11 DIAGNOSIS — S329XXA Fracture of unspecified parts of lumbosacral spine and pelvis, initial encounter for closed fracture: Secondary | ICD-10-CM

## 2013-10-11 LAB — PROTIME-INR: Prothrombin Time: 23 seconds — ABNORMAL HIGH (ref 11.6–15.2)

## 2013-10-11 MED ORDER — METOPROLOL TARTRATE 25 MG PO TABS: 25.0000 mg | ORAL_TABLET | Freq: Two times a day (BID) | ORAL | Status: DC

## 2013-10-11 MED ORDER — CEFUROXIME AXETIL 500 MG PO TABS: 500.0000 mg | ORAL_TABLET | Freq: Two times a day (BID) | ORAL | Status: AC

## 2013-10-11 MED ORDER — METOPROLOL TARTRATE 25 MG PO TABS
25.0000 mg | ORAL_TABLET | Freq: Two times a day (BID) | ORAL | Status: DC
  Filled 2013-10-11: qty 1

## 2013-10-11 MED ORDER — METOPROLOL TARTRATE 50 MG PO TABS: 25.0000 mg | ORAL_TABLET | Freq: Two times a day (BID) | ORAL | Status: DC

## 2013-10-11 NOTE — Progress Notes (Signed)
ANTICOAGULATION CONSULT NOTE   Pharmacy Consult for Coumadin Indication: atrial fibrillation  No Known Allergies  Patient Measurements: Height: 5\' 2"  (157.5 cm) Weight: 113 lb 3.2 oz (51.347 kg) IBW/kg (Calculated) : 50.1  Vital Signs: Temp: 97.5 F (36.4 C) (11/12 0616) Temp src: Oral (11/12 0616) BP: 106/70 mmHg (11/12 1104) Pulse Rate: 77 (11/12 1104)  Labs:  Recent Labs  10/09/13 0620 10/10/13 0610 10/11/13 0550  LABPROT 24.6* 26.0* 23.0*  INR 2.31* 2.48* 2.11*    Estimated Creatinine Clearance: 39.2 ml/min (by C-G formula based on Cr of 0.74).  Assessment: 79yo female c/o thigh pain x2d w/ swelling to both ankles, assisted living staff deny known injury, CT reveals minimally displaced fracture, admitted for pain control and to manage UTI, to continue Coumadin for Afib; INR currently slightly subtherapeutic on admission.  Currently on Coumadin home dose of 2.5 mg daily.    INR today = 2.11  Goal of Therapy:  INR 2-3   Plan:  1. Continue Coumadin 2.5 mg po daily 2. Monitor daily INR 3. Consider change to po antibiotics?  Tad Moore, BCPS  Clinical Pharmacist Pager [phone]  10/11/2013 2:13 PM

## 2013-10-11 NOTE — Progress Notes (Signed)
[facility]                                                                                Patient Demographics  Tasha Ball, is a 79 y.o. female, DOB - May 01, 1924, AVW:[id number]  Admit date - 10/07/2013   Admitting Physician Therisa Doyne, MD  Outpatient Primary MD for the patient is Ezequiel Kayser, MD  LOS - 4   Chief Complaint  Patient presents with  . Leg Pain        Assessment & Plan    1. Fall-Pelvic Fracture with R.Ankle Injury - discussed case with orthopedic physician on call Dr. supple over the phone, supportive care with right ankle brace which will be ordered, pain control.  SNF for rehab.  2. History of atrial fibrillation. Continue beta blocker, on Coumadin which pharmacy is monitoring. Will defer long-term maintenance of anticoagulation and risk versus benefit stratification to primary care physician and primary cardiologist.   Lab Results  Component Value Date   INR 2.11* 10/11/2013   INR 2.48* 10/10/2013   INR 2.31* 10/09/2013   PROTIME 24.0* 01/21/2007     3. Dementia. Alert and orientated at this point.  Mildly confused. remains at risk for more delirium. Continue supportive care. Aricept to be continued.    4. Essential hypertension. BP soft today.  Will decrease metoprolol and hold amlodipine and cozaar.   5. Hypothyroidism continue Synthroid supplementation at present dose.    6. UTI. E-Coli, pan sensitive.  Rocephin inpatient x 4 days.  D/C on Ceftin for 2 more days.     Code Status: DNR  Family Communication:    Disposition Plan: SNF when bed available.   Procedures CT pelvis   Consults  PT, S Work   Medications  Scheduled Meds: . cefTRIAXone (ROCEPHIN) IVPB 1 gram/50 mL D5W  1 g Intravenous Q24H  . Chlorhexidine Gluconate Cloth  6 each Topical Q0600  . donepezil  23 mg Oral QHS  . escitalopram  5 mg Oral Daily  . ferrous sulfate  325 mg Oral TID PC  . levothyroxine  75 mcg Oral QAC breakfast  . memantine  10 mg  Oral BID  . metoprolol  25 mg Oral BID  . mirtazapine  15 mg Oral QHS  . mupirocin ointment  1 application Nasal BID  . sodium chloride  3 mL Intravenous Q12H  . warfarin  2.5 mg Oral q1800  . Warfarin - Pharmacist Dosing Inpatient   Does not apply q1800   Continuous Infusions:   PRN Meds:.acetaminophen, HYDROcodone-acetaminophen, LORazepam, morphine injection, ondansetron (ZOFRAN) IV  DVT Prophylaxis  Coumadin  Lab Results  Component Value Date   INR 2.11* 10/11/2013   INR 2.48* 10/10/2013   INR 2.31* 10/09/2013   PROTIME 24.0* 01/21/2007      Lab Results  Component Value Date   PLT 193 10/08/2013    Antibiotics     Anti-infectives   Start     Dose/Rate Route Frequency Ordered Stop   10/11/13 0000  cefUROXime (CEFTIN) 500 MG tablet  Status:  Discontinued     500 mg Oral 2 times daily with meals 10/10/13 0734 10/11/13    10/11/13 0000  cefUROXime (  CEFTIN) 500 MG tablet     500 mg Oral 2 times daily with meals 10/11/13 1208 10/13/13 2359   10/08/13 0400  cefTRIAXone (ROCEPHIN) 1 g in dextrose 5 % 50 mL IVPB     1 g 100 mL/hr over 30 Minutes Intravenous Every 24 hours 10/08/13 0206            Subjective:   Amea Strubel today has, No headache, No chest pain, No abdominal pain - No Nausea, No new weakness tingling or numbness, No Cough - SOB.   She is very pleasant.  Discusses going to [facility] this afternoon.  Objective:   Filed Vitals:   10/10/13 1420 10/10/13 2008 10/11/13 0616 10/11/13 1104  BP: 130/87 124/76 117/68 106/70  Pulse: 75 74 66 77  Temp: 98.6 F (37 C) 98.6 F (37 C) 97.5 F (36.4 C)   TempSrc: Oral Oral Oral   Resp: 18 18 16    Height:      Weight:      SpO2: 96% 96% 94%     Wt Readings from Last 3 Encounters:  10/08/13 51.347 kg (113 lb 3.2 oz)  10/28/11 48.943 kg (107 lb 14.4 oz)  10/26/11 48.898 kg (107 lb 12.8 oz)     Intake/Output Summary (Last 24 hours) at 10/11/13 1212 Last data filed at 10/10/13 1700  Gross per 24 hour   Intake    240 ml  Output      0 ml  Net    240 ml    Exam Awake Alert, Oriented X 1, No new F.N deficits, Normal affect.  Very pleasant [place].AT,PERRAL Supple Neck,No JVD, No cervical lymphadenopathy appriciated.  Symmetrical Chest wall movement, Good air movement bilaterally, CTAB RRR,No Gallops,Rubs or new Murmurs, No Parasternal Heave +ve B.Sounds, Abd Soft, Non tender, No organomegaly appriciated, No rebound - guarding or rigidity. No Cyanosis, Clubbing or edema, No new Rash or bruise    Data Review   Micro Results Recent Results (from the past 240 hour(s))  URINE CULTURE     Status: None   Collection Time    10/07/13 11:34 PM      Result Value Range Status   Specimen Description URINE, CLEAN CATCH   Final   Special Requests NONE   Final   Culture  Setup Time     Final   Value: 10/08/2013 16:08     Performed at [facility]   Culture     Final   Value: >=100,000 COLONIES/mL ESCHERICHIA COLI     Performed at [facility]   Report Status 10/10/2013 FINAL   Final   Organism ID, Bacteria ESCHERICHIA COLI   Final  MRSA PCR SCREENING     Status: Abnormal   Collection Time    10/08/13  1:44 AM      Result Value Range Status   MRSA by PCR POSITIVE (*) NEGATIVE Final   Comment:            The GeneXpert MRSA Assay (FDA     approved for NASAL specimens     only), is one component of a     comprehensive MRSA colonization     surveillance program. It is not     intended to diagnose MRSA     infection nor to guide or     monitor treatment for     MRSA infections.     RESULT CALLED TO, READ BACK BY AND VERIFIED WITH:     ABERNATHY,V RN [phone]  AT [facility]    Radiology Reports Dg Hip Complete Right  10/07/2013   CLINICAL DATA:  Fall, right hip pain  EXAM: RIGHT HIP - COMPLETE 2+ VIEW  COMPARISON:  None.  FINDINGS: Three views of the right hip submitted. No acute fracture or subluxation. There is diffuse osteopenia. Left hip prosthesis in anatomic alignment.   IMPRESSION: No right hip acute fracture or subluxation. Diffuse osteopenia.   Electronically Signed   By: Natasha Mead M.D.   On: 10/07/2013 18:26   Dg Ankle Complete Right  10/07/2013   CLINICAL DATA:  Pain after fall  EXAM: RIGHT ANKLE - COMPLETE 3+ VIEW  COMPARISON:  None.  FINDINGS: Mortise is intact. There is a tiny ossicle off of the medial malleolus. There is a very subtle crescent of bone off the tip of the lateral malleolus. A very subtle avulsion injury cannot be excluded. There are no displaced fractures. There is a small joint effusion.  IMPRESSION: Possible tiny subtle avulsion injury off of the tip of the lateral malleolus. Small joint effusion.   Electronically Signed   By: Esperanza Heir M.D.   On: 10/07/2013 22:24   Ct Hip Right Wo Contrast  10/07/2013   CLINICAL DATA:  Status post falls; right hip pain.  EXAM: CT OF THE RIGHT HIP WITHOUT CONTRAST  TECHNIQUE: Multidetector CT imaging was performed according to the standard protocol. Multiplanar CT image reconstructions were also generated.  COMPARISON:  Right hip radiographs performed earlier today at 6:06 p.m  FINDINGS: There is a minimally displaced fracture along the medial aspect of the right superior and inferior pubic rami, extending to the posterior aspect of the pubic symphysis. No additional fractures are seen. The right hip joint is grossly unremarkable in appearance. The proximal right femur remains grossly intact.  No significant degenerative change is seen. The visualized portions of the right sacroiliac joints are within normal limits. Scattered calcification is noted along the right common femoral artery. There is no evidence of significant soft tissue injury. Mild diverticulosis is noted along the sigmoid colon. Minimal likely vascular calcification is seen about the uterus.  IMPRESSION: 1. Minimally displaced fracture along the medial aspect of the right superior and inferior pubic rami, extending to the posterior aspect of  the pubic symphysis. 2. Scattered calcification along the right common femoral artery. 3. Mild diverticulosis along the sigmoid colon.   Electronically Signed   By: Roanna Raider M.D.   On: 10/07/2013 21:36   Dg Knee Complete 4 Views Right  10/07/2013   CLINICAL DATA:  Right knee pain.  EXAM: RIGHT KNEE - COMPLETE 4+ VIEW  COMPARISON:  None.  FINDINGS: There is no evidence of fracture or dislocation. Chondrocalcinosis noted, with calcification along the menisci. There is mild narrowing of the medial compartment. No significant degenerative change is seen; the patellofemoral joint is grossly unremarkable in appearance.  No significant joint effusion is seen. The visualized soft tissues are normal in appearance.  IMPRESSION: 1. No evidence of fracture or dislocation. 2. Chondrocalcinosis noted; mild narrowing of the medial compartment.   Electronically Signed   By: Roanna Raider M.D.   On: 10/07/2013 23:03    CBC  Recent Labs Lab 10/07/13 2103 10/08/13 0640  WBC 10.1 8.3  HGB 11.6* 10.4*  HCT 36.2 31.7*  PLT 220 193  MCV 95.3 94.6  MCH 30.5 31.0  MCHC 32.0 32.8  RDW 14.3 14.6  LYMPHSABS 1.4  --   MONOABS 1.4*  --   EOSABS 0.1  --  BASOSABS 0.1  --     Chemistries   Recent Labs Lab 10/07/13 2103 10/08/13 0640 10/09/13 0620  NA 137 138  --   K 3.9 3.3* 4.4  CL 98 101  --   CO2 29 27  --   GLUCOSE 114* 85  --   BUN 16 13  --   CREATININE 0.90 0.74  --   CALCIUM 9.2 8.2*  --   MG  --  1.4*  --   AST 24 17  --   ALT 12 10  --   ALKPHOS 161* 135*  --   BILITOT 1.5* 1.1  --     Coagulation profile  Recent Labs Lab 10/07/13 2103 10/09/13 0620 10/10/13 0610 10/11/13 0550  INR 1.75* 2.31* 2.48* 2.11*    York, Mahina Salatino L PA-C on 10/11/2013 at 12:12 PM  Between 7am to 7pm - Pager - [phone]  After 7pm go to [email or web address] - password [email or web address]  And look for the night coverage person covering for me after hours  [facility] Office  [phone]

## 2013-10-11 NOTE — Clinical Social Work Placement (Signed)
Clinical Social Work Department CLINICAL SOCIAL WORK PLACEMENT NOTE 10/11/2013  Patient:  Tasha Ball, Tasha Ball  Account Number:  [id number] Admit date:  10/07/2013  Clinical Social Worker:  Cherre Blanc, [place]  Date/time:  10/10/2013 11:35 AM  Clinical Social Work is seeking post-discharge placement for this patient at the following level of care:   SKILLED NURSING   (*CSW will update this form in Epic as items are completed)   10/10/2013  Patient/family provided with [facility] Department of Clinical Social Work's list of facilities offering this level of care within the geographic area requested by the patient (or if unable, by the patient's family).  10/10/2013  Patient/family informed of their freedom to choose among providers that offer the needed level of care, that participate in Medicare, Medicaid or managed care program needed by the patient, have an available bed and are willing to accept the patient.  10/10/2013  Patient/family informed of [facility]' ownership interest in [facility], as well as of the fact that they are under no obligation to receive care at this facility.  PASARR submitted to EDS on 10/10/2013 PASARR number received from EDS on 10/10/2013  FL2 transmitted to all facilities in geographic area requested by pt/family on  10/10/2013 FL2 transmitted to all facilities within larger geographic area on 10/10/2013  Patient informed that his/her managed care company has contracts with or will negotiate with  certain facilities, including the following:     Patient/family informed of bed offers received:  10/11/2013 Patient chooses bed at [facility] Physician recommends and patient chooses bed at    Patient to be transferred to [facility] on  10/11/2013 Patient to be transferred to facility by Ambulance  The following physician request were entered in Epic:   Additional Comments: Per MD patient ready to transport to [facility]  10/11/13. Patient, son, RN, and facility notified of DC. RN given number for report. RN instructed to call son Nedra Hai when patient is being picked up by EMS. DC packet left with patient's chart. CSW signing off.  Roddie Mc, [place], [place], [phone]

## 2013-10-11 NOTE — Discharge Summary (Signed)
Tasha Ball, is a 79 y.o. female  DOB [birth year]  MRN [id number].  Admission date:  10/07/2013  Admitting Physician  Therisa Doyne, MD  Discharge Date:  10/11/2013   Primary MD  Ezequiel Kayser, MD  Addendum:  Patient was medically stable for discharge on 11/11 but remained overnight awaiting SNF placement.  Recommendations for primary care physician for things to follow:   INR on 10/12/13  CBC in 1 week to monitor hgb./ anemia.  Closely monitor BP.  Medications were held on 11/12 due to soft pressure. At discharge metoprolol was decreased to 25 mg bid, amlodipine and Cozaar were discontinued.  These may need adjustment in the coming days.  Admission Diagnosis  Right thigh pain [729.5]  Discharge Diagnosis  Right pelvic fracture.  Principal Problem:   Pelvic fracture Active Problems:   Atrial fibrillation   Urinary tract infection   Dementia   HTN (hypertension)      Past Medical History  Diagnosis Date  . Urinary tract infection   . Cancer     non-hodgkins lymphoma   . Lymphoma in remission     ca free 5 yrs  . Dementia   . Atrial fibrillation   . Hypertension   . Mitral valve prolapse   . Thyroid disease   . Memory loss   . Osteoporosis     Past Surgical History  Procedure Laterality Date  . Eye surgery      bil.cataract with lens implants  . Hip arthroplasty  10/18/2011    Procedure: ARTHROPLASTY BIPOLAR HIP;  Surgeon: [name];  Location: [facility];  Service: Orthopedics;  Laterality: Left;  depuy     Discharge Condition:    Follow-up Information   Follow up with [facility]. (As needed if symptoms worsen)    Specialty:  Emergency Medicine   Contact information:   [address] [id number] [place] [address] [phone]       Discharge Medications      Medication List    STOP taking these medications       loperamide 2 MG capsule  Commonly  known as:  IMODIUM      TAKE these medications       alendronate 70 MG tablet  Commonly known as:  FOSAMAX  Take 70 mg by mouth every Friday. Take with a full glass of water on an empty stomach.     amLODipine 5 MG tablet  Commonly known as:  NORVASC  Take 2.5 mg by mouth daily.     ARICEPT 23 MG Tabs tablet  Generic drug:  donepezil  Take 23 mg by mouth at bedtime.     b complex vitamins capsule  Take 1 capsule by mouth daily.     BAZA PROTECT EX  Apply 1 application topically every morning.     bismuth subsalicylate 262 MG chewable tablet  Commonly known as:  PEPTO BISMOL  Chew 524 mg by mouth daily as needed.     cefUROXime 500 MG tablet  Commonly known as:  CEFTIN  Take 1 tablet (500 mg total) by mouth 2 (two) times daily with a meal.  Start taking on:  10/11/2013 and stop 10/14/13     escitalopram 10 MG tablet  Commonly known as:  LEXAPRO  Take 10 mg by mouth daily.     ferrous sulfate 325 (65 FE) MG tablet  Take 1 tablet (325 mg total) by mouth 2 (two) times daily with a  meal.     guaiFENesin-codeine 100-10 MG/5ML syrup  Commonly known as:  ROBITUSSIN AC  Take 5 mLs by mouth every 5 (five) hours as needed for cough.     levothyroxine 75 MCG tablet  Commonly known as:  SYNTHROID, LEVOTHROID  Take 75 mcg by mouth daily before breakfast.     losartan 100 MG tablet  Commonly known as:  COZAAR  Take 50 mg by mouth daily.     memantine 10 MG tablet  Commonly known as:  NAMENDA  Take 10 mg by mouth 2 (two) times daily.     metoprolol 50 MG tablet  Commonly known as:  LOPRESSOR  Take 50 mg by mouth 2 (two) times daily.     mirtazapine 15 MG tablet  Commonly known as:  REMERON  Take 15 mg by mouth at bedtime.     silver sulfADIAZINE 1 % cream  Commonly known as:  SILVADENE  Apply 1 application topically 2 (two) times daily.     traMADol 50 MG tablet  Commonly known as:  ULTRAM  Take 1 tablet (50 mg total) by mouth every 6 (six) hours as needed.      Vitamin D (Ergocalciferol) 50000 UNITS Caps capsule  Commonly known as:  DRISDOL  Take 50,000 Units by mouth every Friday.     warfarin 2.5 MG tablet  Commonly known as:  COUMADIN  Take 1 tablet (2.5 mg total) by mouth daily.         Diet and Activity recommendation: Heart Healthy Diet.  Activity per PT, no weight bearing restrictions.  Fall precautions.   Major procedures and Radiology Reports   Dg Hip Complete Right  10/07/2013   CLINICAL DATA:  Fall, right hip pain  EXAM: RIGHT HIP - COMPLETE 2+ VIEW  COMPARISON:  None.  FINDINGS: Three views of the right hip submitted. No acute fracture or subluxation. There is diffuse osteopenia. Left hip prosthesis in anatomic alignment.  IMPRESSION: No right hip acute fracture or subluxation. Diffuse osteopenia.   Electronically Signed   By: Natasha Mead M.D.   On: 10/07/2013 18:26   Dg Ankle Complete Right  10/07/2013   CLINICAL DATA:  Pain after fall  EXAM: RIGHT ANKLE - COMPLETE 3+ VIEW  COMPARISON:  None.  FINDINGS: Mortise is intact. There is a tiny ossicle off of the medial malleolus. There is a very subtle crescent of bone off the tip of the lateral malleolus. A very subtle avulsion injury cannot be excluded. There are no displaced fractures. There is a small joint effusion.  IMPRESSION: Possible tiny subtle avulsion injury off of the tip of the lateral malleolus. Small joint effusion.   Electronically Signed   By: Esperanza Heir M.D.   On: 10/07/2013 22:24   Ct Hip Right Wo Contrast  10/07/2013   CLINICAL DATA:  Status post falls; right hip pain.  EXAM: CT OF THE RIGHT HIP WITHOUT CONTRAST  TECHNIQUE: Multidetector CT imaging was performed according to the standard protocol. Multiplanar CT image reconstructions were also generated.  COMPARISON:  Right hip radiographs performed earlier today at 6:06 p.m  FINDINGS: There is a minimally displaced fracture along the medial aspect of the right superior and inferior pubic rami, extending to the  posterior aspect of the pubic symphysis. No additional fractures are seen. The right hip joint is grossly unremarkable in appearance. The proximal right femur remains grossly intact.  No significant degenerative change is seen. The visualized portions of the right sacroiliac joints are within  normal limits. Scattered calcification is noted along the right common femoral artery. There is no evidence of significant soft tissue injury. Mild diverticulosis is noted along the sigmoid colon. Minimal likely vascular calcification is seen about the uterus.  IMPRESSION: 1. Minimally displaced fracture along the medial aspect of the right superior and inferior pubic rami, extending to the posterior aspect of the pubic symphysis. 2. Scattered calcification along the right common femoral artery. 3. Mild diverticulosis along the sigmoid colon.   Electronically Signed   By: Roanna Raider M.D.   On: 10/07/2013 21:36   Dg Knee Complete 4 Views Right  10/07/2013   CLINICAL DATA:  Right knee pain.  EXAM: RIGHT KNEE - COMPLETE 4+ VIEW  COMPARISON:  None.  FINDINGS: There is no evidence of fracture or dislocation. Chondrocalcinosis noted, with calcification along the menisci. There is mild narrowing of the medial compartment. No significant degenerative change is seen; the patellofemoral joint is grossly unremarkable in appearance.  No significant joint effusion is seen. The visualized soft tissues are normal in appearance.  IMPRESSION: 1. No evidence of fracture or dislocation. 2. Chondrocalcinosis noted; mild narrowing of the medial compartment.   Electronically Signed   By: Roanna Raider M.D.   On: 10/07/2013 23:03    Micro Results    Recent Results (from the past 240 hour(s))  URINE CULTURE     Status: None   Collection Time    10/07/13 11:34 PM      Result Value Range Status   Specimen Description URINE, CLEAN CATCH   Final   Special Requests NONE   Final   Culture  Setup Time     Final   Value: 10/08/2013 16:08      Performed at [facility]   Culture     Final   Value: >=100,000 COLONIES/mL ESCHERICHIA COLI     Performed at [facility]   Report Status 10/10/2013 FINAL   Final   Organism ID, Bacteria ESCHERICHIA COLI   Final  MRSA PCR SCREENING     Status: Abnormal   Collection Time    10/08/13  1:44 AM      Result Value Range Status   MRSA by PCR POSITIVE (*) NEGATIVE Final   Comment:            The GeneXpert MRSA Assay (FDA     approved for NASAL specimens     only), is one component of a     comprehensive MRSA colonization     surveillance program. It is not     intended to diagnose MRSA     infection nor to guide or     monitor treatment for     MRSA infections.     RESULT CALLED TO, READ BACK BY AND VERIFIED WITH:     ABERNATHY,V RN [phone] AT [phone] SKEEN,P     History of present illness at the time of admission and  Hospital Course:     KIMRA KANTOR is a 79 y.o. female with a past medical history of Urinary tract infection; Cancer; Lymphoma in remission; Dementia; Atrial fibrillation; Hypertension; Mitral valve prolapse; Thyroid disease; Memory loss; and Osteoporosis.  She presented from assisted living after having right thigh pain with hx of recent falls. She has hx of dementia and had trouble providing her own hx. Patient was evaluated at [facility] and CT scan showed evidence of mild pubic ramus fracture. Patient was found to have UTI she is being admitted for treatment  and pain control.     1. Fall-Pelvic Fracture with R.Ankle Injury - discussed case with orthopedic physician on call Dr. Rennis Chris over the phone, supportive care with right ankle brace was employed as an inpatient.  Pain control.  Physical Therapy evaluation was completed and recommended. SNF.  The patient will be discharged to a skilled nursing facility when a bed is available.    2. History of atrial fibrillation. Rate controlled.  Continue beta blocker, on Coumadin which pharmacy has monitored as an  inpatient.  Will defer long-term maintenance of anticoagulation and risk versus benefit stratification to primary care physician and primary cardiologist.   INR was 2.11 on 11/12.  Lab Results   Component  Value  Date    INR  2.31*  10/09/2013    INR  1.75*  10/07/2013    INR  2.79*  11/03/2011    PROTIME  24.0*  01/21/2007      3. Dementia. Alert and orientated at this point. Mildly confused. Very pleasant.  remains at risk for more delirium. Continue supportive care. Aricept and Namenda have been continued.     4. Essential hypertension. Patient's BP medications have been decreased.  Metoprolol dose has been decreased from 50 mg bid >> 25 mg bid.  Cozaar and amlodipine have been discontinued.  We request that her BP be closely monitored at the skilled nursing facility and BP med be adjusted as appropriate.    5. Hypothyroidism.  Recommend continue Synthroid supplementation at present dose.   Lab Results  Component Value Date   TSH 2.652 10/08/2013     6. UTI. E-coli, pan sensitive.  Treated with Rocephin for 4 days.  Will be discharged on Ceftin for another 2 days.         Today   Subjective:   Shandie Pickering in bed, no headache, no chest-abd pain, no SOB  Objective:   Blood pressure 106/70, pulse 77, temperature 97.5 F (36.4 C), temperature source Oral, resp. rate 16, height 5\' 2"  (1.575 m), weight 51.347 kg (113 lb 3.2 oz), SpO2 94.00%.   Intake/Output Summary (Last 24 hours) at 10/11/13 1447 Last data filed at 10/10/13 1700  Gross per 24 hour  Intake    240 ml  Output      0 ml  Net    240 ml    Exam Awake Alert, Oriented to person and place, No new F.N deficits, Normal affect. Very pleasant, mildly confused. [place].AT,PERRAL  Supple Neck,No JVD, No cervical lymphadenopathy appreciated.  Symmetrical Chest wall movement, Good air movement bilaterally, CTAB  RRR,No Gallops,Rubs or new Murmurs, No Parasternal Heave  +ve B.Sounds, Abd Soft, slightly protuberant,  Non tender, No organomegaly appreciated, No rebound - guarding or rigidity.  No Cyanosis, Clubbing or edema, No new Rash or bruise    Data Review   CBC w Diff: Lab Results  Component Value Date   WBC 8.3 10/08/2013   WBC 7.4 11/03/2010   WBC 5.6 03/15/2008   HGB 10.4* 10/08/2013   HGB 13.9 11/03/2010   HGB 13.7 03/15/2008   HCT 31.7* 10/08/2013   HCT 41.9 11/03/2010   HCT 38.9 03/15/2008   PLT 193 10/08/2013   PLT 271 11/03/2010   PLT 210 03/15/2008   LYMPHOPCT 13 10/07/2013   LYMPHOPCT 18.6 11/03/2010   LYMPHOPCT 27.7 03/15/2008   MONOPCT 14* 10/07/2013   MONOPCT 8.3 11/03/2010   MONOPCT 11.5 03/15/2008   EOSPCT 1 10/07/2013   EOSPCT 1.6 11/03/2010   EOSPCT 1.9 03/15/2008  BASOPCT 1 10/07/2013   BASOPCT 0.6 11/03/2010   BASOPCT 0.9 03/15/2008    CMP: Lab Results  Component Value Date   NA 138 10/08/2013   K 4.4 10/09/2013   CL 101 10/08/2013   CO2 27 10/08/2013   BUN 13 10/08/2013   CREATININE 0.74 10/08/2013   PROT 5.6* 10/08/2013   ALBUMIN 2.6* 10/08/2013   BILITOT 1.1 10/08/2013   ALKPHOS 135* 10/08/2013   AST 17 10/08/2013   ALT 10 10/08/2013  .   Total Time in preparing paper work, data evaluation and todays exam - 60 minutes  Algis Downs, [place] [facility] Pager: [phone] 10/10/13   [facility] Office  [phone]   Addendum  Patient seen and examined, chart and data base reviewed.  I agree with the above assessment and plan.  For full details please see Mrs. Algis Downs PA note.   Clint Lipps, MD [facility] Pager: [phone] 10/11/2013, 2:47 PM ]

## 2013-10-11 NOTE — Progress Notes (Signed)
Addendum  Patient seen and examined, chart and data base reviewed.  I agree with the above assessment and plan.  For full details please see Mrs. Algis Downs PA note.  Fall with resultant pelvic fracture and right ankle injury.  Patient also has UTI.   Clint Lipps, MD [facility] Pager: [phone] 10/11/2013, 2:49 PM

## 2013-10-12 ENCOUNTER — Other Ambulatory Visit: Payer: Self-pay | Admitting: *Deleted

## 2013-10-12 ENCOUNTER — Non-Acute Institutional Stay (SKILLED_NURSING_FACILITY): Payer: Medicare Other | Admitting: Adult Health

## 2013-10-12 DIAGNOSIS — E039 Hypothyroidism, unspecified: Secondary | ICD-10-CM | POA: Insufficient documentation

## 2013-10-12 DIAGNOSIS — S99911A Unspecified injury of right ankle, initial encounter: Secondary | ICD-10-CM

## 2013-10-12 DIAGNOSIS — I10 Essential (primary) hypertension: Secondary | ICD-10-CM

## 2013-10-12 DIAGNOSIS — F329 Major depressive disorder, single episode, unspecified: Secondary | ICD-10-CM | POA: Insufficient documentation

## 2013-10-12 DIAGNOSIS — N390 Urinary tract infection, site not specified: Secondary | ICD-10-CM

## 2013-10-12 DIAGNOSIS — I4891 Unspecified atrial fibrillation: Secondary | ICD-10-CM

## 2013-10-12 DIAGNOSIS — S8990XA Unspecified injury of unspecified lower leg, initial encounter: Secondary | ICD-10-CM

## 2013-10-12 DIAGNOSIS — S329XXA Fracture of unspecified parts of lumbosacral spine and pelvis, initial encounter for closed fracture: Secondary | ICD-10-CM

## 2013-10-12 DIAGNOSIS — F0390 Unspecified dementia without behavioral disturbance: Secondary | ICD-10-CM

## 2013-10-12 DIAGNOSIS — D62 Acute posthemorrhagic anemia: Secondary | ICD-10-CM

## 2013-10-12 MED ORDER — GUAIFENESIN-CODEINE 100-10 MG/5ML PO SYRP: 5.0000 mL | ORAL_SOLUTION | ORAL | Status: DC | PRN

## 2013-10-12 MED ORDER — TRAMADOL HCL 50 MG PO TABS: 50.0000 mg | ORAL_TABLET | Freq: Four times a day (QID) | ORAL | Status: DC | PRN

## 2013-10-12 NOTE — Care Management Note (Signed)
    Page 1 of 1   10/12/2013     2:45:47 PM   CARE MANAGEMENT NOTE 10/12/2013  Patient:  Tasha Ball, Tasha Ball   Account Number:  [id number]  Date Initiated:  10/12/2013  Documentation initiated by:  Letha Cape  Subjective/Objective Assessment:   dx pelvic fx, uti  admit- from [facility].     Action/Plan:   pt eval rec snf   Anticipated DC Date:  10/11/2013   Anticipated DC Plan:  SKILLED NURSING FACILITY  In-house referral  Clinical Social Worker      DC Planning Services  CM consult      Choice offered to / List presented to:             Status of service:  Completed, signed off Medicare Important Message given?   (If response is "NO", the following Medicare IM given date fields will be blank) Date Medicare IM given:   Date Additional Medicare IM given:    Discharge Disposition:  SKILLED NURSING FACILITY  Per UR Regulation:  Reviewed for med. necessity/level of care/duration of stay  If discussed at Long Length of Stay Meetings, dates discussed:    Comments:

## 2013-10-12 NOTE — Progress Notes (Signed)
Patient ID: Tasha Ball, female   DOB: November 13, 1924, 79 y.o.   MRN: [id number]       PROGRESS NOTE  DATE: 10/12/2013  FACILITY: Nursing Home Location: [facility]  LEVEL OF CARE: SNF (31)  Acute Visit  CHIEF COMPLAINT:  Follow-up hospitalization  HISTORY OF PRESENT ILLNESS: This is an 79 year old female who has been admitted to [facility] on 10/11/13 from [facility]. She had a fall and sustained right pelvic fracture and right ankle injury. Right ankle brace applied. She has been admitted for a short-term rehabilitation.  REASSESSMENT OF ONGOING PROBLEM(S):  HTN: Pt 's HTN remains stable.  Denies CP, sob, DOE, pedal edema, headaches, dizziness or visual disturbances.  No complications from the medications currently being used.  Last BP : 119/79  ATRIAL FIBRILLATION: the patients atrial fibrillation remains stable.  The patient denies DOE, tachycardia, orthopnea, transient neurological sx, pedal edema, palpitations, & PNDs.  No complications noted from the medications currently being used.  UTI: The UTI remains stable.  The patient denies ongoing suprapubic pain, flank pain, dysuria, urinary frequency, urinary hesitancy or hematuria.  No complications reported from the current antibiotic being used.  PAST MEDICAL HISTORY : Reviewed.  No changes.  CURRENT MEDICATIONS: Reviewed per [facility]  REVIEW OF SYSTEMS:  GENERAL: no change in appetite, no fatigue, no weight changes, no fever, chills or weakness RESPIRATORY: no cough, SOB, DOE, wheezing, hemoptysis CARDIAC: no chest pain, edema or palpitations GI: no abdominal pain, diarrhea, constipation, heart burn, nausea or vomiting  PHYSICAL EXAMINATION  VS:  T 97.2      P88      RR18      BP119/79     POX 97%     WT113 (Lb)  GENERAL: no acute distress, normal body habitus EYES: conjunctivae normal, sclerae normal, normal eye lids NECK: supple, trachea midline, no neck masses, no thyroid tenderness, no  thyromegaly LYMPHATICS: no LAN in the neck, no supraclavicular LAN RESPIRATORY: breathing is even & unlabored, BS CTAB CARDIAC: RRR, no murmur,no extra heart sounds, no edema GI: abdomen soft, normal BS, no masses, no tenderness, no hepatomegaly, no splenomegaly EXTREMITIES:  + right ankle brace PSYCHIATRIC: the patient is alert & oriented to person, affect & behavior appropriate  LABS/RADIOLOGY: 10/09/13  potassium 4.4 10/08/13  magnesium 1.4 phosphorus 3.0 TSH 2.652 sodium 138 potassium 3.3 glucose 85 BUN 13 creatinine 0.74 calcium 8.2 total protein 5.6 albumin 2.6 WBC 8.3 hemoglobin 10.4 hematocrit 31.7   ASSESSMENT/PLAN:  Right pelvic fracture  - for rehabilitation  Right ankle injury - continue ankle brace  Hypertension - well controlled  Atrial fibrillation - rate controlled  UTI - continue Ceftin  Dementia - stable  Anemia - continue Iron supplementation  Depression - continue Remeron and Lexapro  Hypothyroidism - continue Synthroid     CPT CODE: 86578

## 2013-10-13 ENCOUNTER — Non-Acute Institutional Stay (SKILLED_NURSING_FACILITY): Payer: Medicare Other | Admitting: Adult Health

## 2013-10-13 DIAGNOSIS — I4891 Unspecified atrial fibrillation: Secondary | ICD-10-CM

## 2013-10-13 DIAGNOSIS — IMO0001 Reserved for inherently not codable concepts without codable children: Secondary | ICD-10-CM

## 2013-10-13 DIAGNOSIS — S329XXD Fracture of unspecified parts of lumbosacral spine and pelvis, subsequent encounter for fracture with routine healing: Secondary | ICD-10-CM

## 2013-10-13 DIAGNOSIS — Z7901 Long term (current) use of anticoagulants: Secondary | ICD-10-CM

## 2013-10-17 ENCOUNTER — Non-Acute Institutional Stay (SKILLED_NURSING_FACILITY): Payer: Medicare Other | Admitting: Adult Health

## 2013-10-17 DIAGNOSIS — IMO0001 Reserved for inherently not codable concepts without codable children: Secondary | ICD-10-CM

## 2013-10-17 DIAGNOSIS — S329XXD Fracture of unspecified parts of lumbosacral spine and pelvis, subsequent encounter for fracture with routine healing: Secondary | ICD-10-CM

## 2013-10-17 DIAGNOSIS — Z7901 Long term (current) use of anticoagulants: Secondary | ICD-10-CM

## 2013-10-17 DIAGNOSIS — I4891 Unspecified atrial fibrillation: Secondary | ICD-10-CM

## 2013-10-18 ENCOUNTER — Non-Acute Institutional Stay (SKILLED_NURSING_FACILITY): Payer: Medicare Other | Admitting: Internal Medicine

## 2013-10-18 DIAGNOSIS — S329XXS Fracture of unspecified parts of lumbosacral spine and pelvis, sequela: Secondary | ICD-10-CM

## 2013-10-18 DIAGNOSIS — IMO0002 Reserved for concepts with insufficient information to code with codable children: Secondary | ICD-10-CM

## 2013-10-18 DIAGNOSIS — I4891 Unspecified atrial fibrillation: Secondary | ICD-10-CM

## 2013-10-18 DIAGNOSIS — I10 Essential (primary) hypertension: Secondary | ICD-10-CM

## 2013-10-18 DIAGNOSIS — E039 Hypothyroidism, unspecified: Secondary | ICD-10-CM

## 2013-10-26 ENCOUNTER — Non-Acute Institutional Stay (SKILLED_NURSING_FACILITY): Payer: Medicare Other | Admitting: Adult Health

## 2013-10-26 DIAGNOSIS — E039 Hypothyroidism, unspecified: Secondary | ICD-10-CM

## 2013-10-26 DIAGNOSIS — S99911A Unspecified injury of right ankle, initial encounter: Secondary | ICD-10-CM

## 2013-10-26 DIAGNOSIS — S329XXA Fracture of unspecified parts of lumbosacral spine and pelvis, initial encounter for closed fracture: Secondary | ICD-10-CM

## 2013-10-26 DIAGNOSIS — I10 Essential (primary) hypertension: Secondary | ICD-10-CM

## 2013-10-26 DIAGNOSIS — S8990XA Unspecified injury of unspecified lower leg, initial encounter: Secondary | ICD-10-CM

## 2013-10-26 DIAGNOSIS — F329 Major depressive disorder, single episode, unspecified: Secondary | ICD-10-CM

## 2013-10-26 DIAGNOSIS — D62 Acute posthemorrhagic anemia: Secondary | ICD-10-CM

## 2013-10-26 DIAGNOSIS — F3289 Other specified depressive episodes: Secondary | ICD-10-CM

## 2013-10-26 DIAGNOSIS — Z7901 Long term (current) use of anticoagulants: Secondary | ICD-10-CM | POA: Insufficient documentation

## 2013-10-26 DIAGNOSIS — F0390 Unspecified dementia without behavioral disturbance: Secondary | ICD-10-CM

## 2013-10-26 DIAGNOSIS — S99929A Unspecified injury of unspecified foot, initial encounter: Secondary | ICD-10-CM

## 2013-10-26 DIAGNOSIS — I4891 Unspecified atrial fibrillation: Secondary | ICD-10-CM

## 2013-10-26 NOTE — Progress Notes (Signed)
Patient ID: Tasha Ball, female   DOB: 09-24-1924, 79 y.o.   MRN: [id number]  Subjective:     Indication: atrial fibrillation Pelvic fracture Bleeding signs/symptoms: None Thromboembolic signs/symptoms: None  Missed Coumadin doses: None Medication changes: no Dietary changes: no Bacterial/viral infection: no Other concerns: no   Review of Systems A comprehensive review of systems was negative.   Objective:    INR Today: 1.5 Current dose: Coumadin 3 mg daily     Assessment:    Subtherapeutic INR for goal of 2-3   Plan:    1. New dose: Coumadin 5 mg PO X 1 today then increase Coumadin to 4 mg PO Q D   2. Next INR:  10/20/13

## 2013-10-26 NOTE — Progress Notes (Signed)
Patient ID: Tasha Ball, female   DOB: 07-14-1924, 79 y.o.   MRN: [id number] Subjective:     Indication: atrial fibrillation Bleeding signs/symptoms: None Thromboembolic signs/symptoms: None  Missed Coumadin doses: None Medication changes: no Dietary changes: no Bacterial/viral infection: no Other concerns: no   Review of Systems A comprehensive review of systems was negative.   Objective:    INR Today: 1.8 Current dose: Coumadin 2.5 mg daily     Assessment:    Subtherapeutic INR for goal of 2-3   Plan:    1. New dose: increase Coumadin to 3 mg PO Q D   2. Next INR:  10/17/13

## 2013-10-26 NOTE — Progress Notes (Signed)
Patient ID: Tasha Ball, female   DOB: 1924/11/12, 79 y.o.   MRN: [id number]              PROGRESS NOTE  DATE: 10/26/2013   FACILITY: [facility]  LEVEL OF CARE: SNF (31)  Acute Visit  CHIEF COMPLAINT:  Discharge Notes  HISTORY OF PRESENT ILLNESS:This is an 79 year old female who is for discharge to an assisted living facility with PT, OT and Nursing. She has been admitted to [facility] on 10/11/13 from [facility]. She had a fall and sustained right pelvic fracture and right ankle injury. Right ankle brace applied. Patient was admitted to this facility for short-term rehabilitation after the patient's recent hospitalization.  Patient has completed SNF rehabilitation and therapy has cleared the patient for discharge.  Reassessment of ongoing problem(s):  HTN: Pt 's HTN remains stable.  Denies CP, sob, DOE, pedal edema, headaches, dizziness or visual disturbances.  No complications from the medications currently being used.  Last BP : 139/76  DEMENTIA: The dementia remaines stable and continues to function adequately in the current living environment with supervision.  The patient has had little changes in behavior. No complications noted from the medications presently being used.  ANEMIA: The anemia has been stable. The patient denies fatigue, melena or hematochezia. No complications from the medications currently being used. 11/14 hemoglobin 10.4    PAST MEDICAL HISTORY : Reviewed.  No changes.  CURRENT MEDICATIONS: Reviewed per [facility]  REVIEW OF SYSTEMS:  GENERAL: no change in appetite, no fatigue, no weight changes, no fever, chills or weakness RESPIRATORY: no cough, SOB, DOE, wheezing, hemoptysis CARDIAC: no chest pain, edema or palpitations GI: no abdominal pain, diarrhea, constipation, heart burn, nausea or vomiting  PHYSICAL EXAMINATION  VS:  T97.9       P86       RR18      BP139/76      POX93 %       WT111.4 (Lb)  GENERAL: no acute distress,  normal body habitus NECK: supple, trachea midline, no neck masses, no thyroid tenderness, no thyromegaly LYMPHATICS: no LAN in the neck, no supraclavicular LAN RESPIRATORY: breathing is even & unlabored, BS CTAB CARDIAC: RRR, no murmur,no extra heart sounds, no edema GI: abdomen soft, normal BS, no masses, no tenderness, no hepatomegaly, no splenomegaly EXTREMITIES:  + right ankle brace PSYCHIATRIC: the patient is alert & oriented to person, affect & behavior appropriate  LABS/RADIOLOGY: 10/09/13  potassium 4.4 10/08/13  magnesium 1.4 phosphorus 3.0 TSH 2.652 sodium 138 potassium 3.3 glucose 85 BUN 13 creatinine 0.74 calcium 8.2 total protein 5.6 albumin 2.6 WBC 8.3 hemoglobin 10.4 hematocrit 31.7   ASSESSMENT/PLAN:  Right pelvic fracture  - for Home health PT, OT and Nursing   Right ankle injury - continue ankle brace  Hypertension - well controlled; continue Norvasc and Cozaar  Atrial fibrillation - rate controlled; continue Lopressor; schedule for cardiology appointment regarding Coumadin length of therapy   Dementia - stable  Anemia - continue Iron supplementation  Depression - continue Remeron and Lexapro  Hypothyroidism - continue Synthroid   I have filled out patient's discharge paperwork and written prescriptions.  Patient will receive home health PT, OT, ST and Nursing.   Total discharge time: Less than 30 minutes Discharge time involved coordination of the discharge process with Child psychotherapist, nursing staff and therapy department. Medical justification for home health services verified.  CPT CODE: 08657

## 2013-12-05 ENCOUNTER — Encounter: Payer: Self-pay | Admitting: Internal Medicine

## 2013-12-05 NOTE — Progress Notes (Signed)
Patient ID: Tasha Ball, female   DOB: November 04, 1924, 80 y.o.   MRN: [id number]        HISTORY & PHYSICAL  DATE: 10/18/2013     FACILITY: [facility]  LEVEL OF CARE: SNF (31)  ALLERGIES:  No Known Allergies  CHIEF COMPLAINT:  Manage right pelvic fracture, atrial fibrillation, and hypothyroidism.    HISTORY OF PRESENT ILLNESS:  The patient is an 80 year-old, Caucasian female.    RIGHT PELVIC FRACTURE:  Patient was admitted with right thigh pain and history of falls.  CT of the hip showed evidence of pubic ramus fracture.  Orthopedics recommended conservative care.  She is admitted to this facility for short-term rehabilitation.  She denies ongoing pelvic pain.    ATRIAL FIBRILLATION: the patients atrial fibrillation remains stable.  The patient denies DOE, tachycardia, orthopnea, transient neurological sx, pedal edema, palpitations, & PNDs.  No complications noted from the medications currently being used.    HYPOTHYROIDISM: The hypothyroidism remains stable. No complications noted from the medications presently being used.  The patient denies fatigue or constipation.  Last TSH:  2.652.     PAST MEDICAL HISTORY :  Past Medical History  Diagnosis Date  . Urinary tract infection   . Cancer     non-hodgkins lymphoma   . Lymphoma in remission     ca free 5 yrs  . Dementia   . Atrial fibrillation   . Hypertension   . Mitral valve prolapse   . Thyroid disease   . Memory loss   . Osteoporosis     PAST SURGICAL HISTORY: Past Surgical History  Procedure Laterality Date  . Eye surgery      bil.cataract with lens implants  . Hip arthroplasty  10/18/2011    Procedure: ARTHROPLASTY BIPOLAR HIP;  Surgeon: Mauri Pole;  Location: [facility];  Service: Orthopedics;  Laterality: Left;  depuy    SOCIAL HISTORY:  reports that she has never smoked. She has never used smokeless tobacco. She reports that she does not drink alcohol or use illicit drugs.  FAMILY HISTORY:   Family History  Problem Relation Age of Onset  . Heart disease Father   . Angina Father   . Stomach cancer Brother 27  . Coronary artery disease Brother     CURRENT MEDICATIONS: Reviewed per [facility]  REVIEW OF SYSTEMS:  See HPI otherwise 14 point ROS is negative.  PHYSICAL EXAMINATION  VS:  T 98.2       P 77      RR 20      BP 111/73        GENERAL: no acute distress, normal body habitus EYES: conjunctivae normal, sclerae normal, normal eye lids MOUTH/THROAT: lips without lesions,no lesions in the mouth,tongue is without lesions,uvula elevates in midline NECK: supple, trachea midline, no neck masses, no thyroid tenderness, no thyromegaly LYMPHATICS: no LAN in the neck, no supraclavicular LAN RESPIRATORY: breathing is even & unlabored, BS CTAB CARDIAC: heart rate is irregular irregular, no murmur,no extra heart sounds, no edema GI:  ABDOMEN: abdomen soft, normal BS, no masses, no tenderness  LIVER/SPLEEN: no hepatomegaly, no splenomegaly MUSCULOSKELETAL: HEAD: normal to inspection & palpation BACK: no kyphosis, scoliosis or spinal processes tenderness EXTREMITIES: LEFT UPPER EXTREMITY: full range of motion, normal strength & tone RIGHT UPPER EXTREMITY:  full range of motion, normal strength & tone LEFT LOWER EXTREMITY:  full range of motion, normal strength & tone RIGHT LOWER EXTREMITY: strength intact, range of motion moderate  PSYCHIATRIC: the patient is alert & oriented to person, affect & behavior appropriate  LABS/RADIOLOGY: Right hip x-ray:  No acute fracture or subluxation.    Right ankle x-ray:  Tiny avulsion injury of the tip of the malleolus.  Small joint effusion.    CT of the right hip:  Showed minimally displaced fracture along the medial aspect of the right superior and inferior pubic rami, extending to the posterior aspect of the pubic surfaces.    Right knee x-ray:  Did not show fracture or dislocation.    Urine culture grew E.coli.    MRSA by PCR  positive.    Labs reviewed: Basic Metabolic Panel:  Recent Labs  10/07/13 2103 10/08/13 0640 10/09/13 0620  NA 137 138  --   K 3.9 3.3* 4.4  CL 98 101  --   CO2 29 27  --   GLUCOSE 114* 85  --   BUN 16 13  --   CREATININE 0.90 0.74  --   CALCIUM 9.2 8.2*  --   MG  --  1.4*  --   PHOS  --  3.0  --    Liver Function Tests:  Recent Labs  10/07/13 2103 10/08/13 0640  AST 24 17  ALT 12 10  ALKPHOS 161* 135*  BILITOT 1.5* 1.1  PROT 6.7 5.6*  ALBUMIN 3.2* 2.6*    CBC:  Recent Labs  10/07/13 2103 10/08/13 0640  WBC 10.1 8.3  NEUTROABS 7.2  --   HGB 11.6* 10.4*  HCT 36.2 31.7*  MCV 95.3 94.6  PLT 220 193     ASSESSMENT/PLAN:  Right pelvic fracture.  Continue conservative management.    Atrial fibrillation.  Rate controlled.    Hypothyroidism.  Well controlled.      Hypertension.  Well controlled.     Dementia.  Stable.    UTI.  Patient was treated.    Check CBC and BMP.    THN Metrics:   Not on aspirin.    I have reviewed patient's medical records received at admission/from hospitalization.  CPT CODE: 60737

## 2013-12-11 ENCOUNTER — Encounter: Payer: Self-pay | Admitting: Internal Medicine

## 2014-02-07 ENCOUNTER — Other Ambulatory Visit: Payer: Self-pay | Admitting: *Deleted

## 2014-02-07 MED ORDER — TRAMADOL HCL 50 MG PO TABS: ORAL_TABLET | ORAL | Status: DC

## 2014-02-07 NOTE — Telephone Encounter (Signed)
[facility] 

## 2014-02-09 ENCOUNTER — Non-Acute Institutional Stay (SKILLED_NURSING_FACILITY): Payer: Medicare Other | Admitting: Adult Health

## 2014-02-09 ENCOUNTER — Encounter: Payer: Self-pay | Admitting: Adult Health

## 2014-02-09 DIAGNOSIS — I10 Essential (primary) hypertension: Secondary | ICD-10-CM

## 2014-02-09 DIAGNOSIS — F0390 Unspecified dementia without behavioral disturbance: Secondary | ICD-10-CM

## 2014-02-09 DIAGNOSIS — E559 Vitamin D deficiency, unspecified: Secondary | ICD-10-CM

## 2014-02-09 DIAGNOSIS — E039 Hypothyroidism, unspecified: Secondary | ICD-10-CM

## 2014-02-09 DIAGNOSIS — I4891 Unspecified atrial fibrillation: Secondary | ICD-10-CM

## 2014-02-09 DIAGNOSIS — F32A Depression, unspecified: Secondary | ICD-10-CM

## 2014-02-09 DIAGNOSIS — D62 Acute posthemorrhagic anemia: Secondary | ICD-10-CM

## 2014-02-09 DIAGNOSIS — F3289 Other specified depressive episodes: Secondary | ICD-10-CM

## 2014-02-09 DIAGNOSIS — F329 Major depressive disorder, single episode, unspecified: Secondary | ICD-10-CM

## 2014-02-09 DIAGNOSIS — M810 Age-related osteoporosis without current pathological fracture: Secondary | ICD-10-CM

## 2014-02-09 MED ORDER — LEVOTHYROXINE SODIUM 100 MCG PO TABS: 100.0000 ug | ORAL_TABLET | Freq: Every day | ORAL | Status: AC

## 2014-02-09 MED ORDER — VITAMIN D 1000 UNITS PO CAPS: 1000.0000 [IU] | ORAL_CAPSULE | Freq: Every day | ORAL | Status: AC

## 2014-02-09 NOTE — Progress Notes (Signed)
Patient ID: Tasha Ball, female   DOB: 1924/07/03, 80 y.o.   MRN: [id number]     [facility]  No Known Allergies   Chief Complaint  Patient presents with  . Acute Visit    follow up transfer     HPI:  She has been transferred from an assisted living facility. This does represent a long term placement for her. She is wandering; has had a wonder guard placed for her safety.  There are no concerns being voiced by the nursing staff at this time. She is unable to fully participate in the hpi or ros.    Past Medical History  Diagnosis Date  . Urinary tract infection   . Cancer     non-hodgkins lymphoma   . Lymphoma in remission     ca free 5 yrs  . Dementia   . Atrial fibrillation   . Hypertension   . Mitral valve prolapse   . Thyroid disease   . Memory loss   . Osteoporosis   . 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 Hip fracture, left 10/16/2011  . Femoral fracture 10/27/2011  . Pelvic fracture 10/08/2013  . HTN (hypertension) 10/20/2011    Past Surgical History  Procedure Laterality Date  . Eye surgery      bil.cataract with lens implants  . Hip arthroplasty  10/18/2011    Procedure: ARTHROPLASTY BIPOLAR HIP;  Surgeon: Mauri Pole;  Location: [facility];  Service: Orthopedics;  Laterality: Left;  depuy    VITAL SIGNS BP 139/74  Pulse 65  Wt 102 lb (46.267 kg)   Patient's Medications  New Prescriptions   No medications on file  Previous Medications   ALENDRONATE (FOSAMAX) 70 MG TABLET    Take 70 mg by mouth every Friday. Take with a full glass of water on an empty stomach.   AMLODIPINE (NORVASC) 2.5 MG TABLET    Take 2.5 mg by mouth daily.   B COMPLEX VITAMINS CAPSULE    Take 1 capsule by mouth daily.   DONEPEZIL (ARICEPT) 23 MG TABS TABLET    Take 23 mg by mouth at bedtime.    ESCITALOPRAM (LEXAPRO) 10 MG TABLET    Take 10 mg by mouth daily.   FERROUS SULFATE 325 (65 FE) MG TABLET    Take 1 tablet (325 mg total) by mouth 2 (two) times daily with a meal.   LEVOTHYROXINE (SYNTHROID, LEVOTHROID) 75 MCG TABLET    Take 75 mcg by mouth daily before breakfast.   LOSARTAN (COZAAR) 50 MG TABLET    Take 50 mg by mouth daily.   MEMANTINE HCL ER (NAMENDA XR) 28 MG CP24    Take 28 mg by mouth daily.   MIRTAZAPINE (REMERON) 15 MG TABLET    Take 15 mg by mouth at bedtime.   TRAMADOL (ULTRAM) 50 MG TABLET    Take one tablet by mouth every 6 hours as needed for pain   VITAMIN D, ERGOCALCIFEROL, (DRISDOL) 50000 UNITS CAPS CAPSULE    Take 50,000 Units by mouth every Friday.  Modified Medications   Modified Medication Previous Medication   METOPROLOL TARTRATE (LOPRESSOR) 25 MG TABLET metoprolol tartrate (LOPRESSOR) 25 MG tablet      Take 50 mg by mouth 2 (two) times daily.    Take 1 tablet (25 mg total) by mouth 2 (two) times daily.   WARFARIN (COUMADIN) 2.5 MG TABLET warfarin (COUMADIN) 2.5 MG tablet      Take 1.5-3 mg by mouth daily. Alternating dose of 1.5  mg and 3 mg    Take 1 tablet (2.5 mg total) by mouth daily.  Discontinued Medications   BISMUTH SUBSALICYLATE (PEPTO BISMOL) 262 MG CHEWABLE TABLET    Chew 524 mg by mouth daily as needed.   GUAIFENESIN-CODEINE (ROBITUSSIN AC) 100-10 MG/5ML SYRUP    Take 5 mLs by mouth every 5 (five) hours as needed for cough.   MEMANTINE (NAMENDA) 10 MG TABLET    Take 10 mg by mouth 2 (two) times daily.    SILVER SULFADIAZINE (SILVADENE) 1 % CREAM    Apply 1 application topically 2 (two) times daily.   SKIN PROTECTANTS, MISC. (BAZA PROTECT EX)    Apply 1 application topically every morning.    SIGNIFICANT DIAGNOSTIC EXAMS  None recent   LABS REVIEWED:   02-09-14: wbc 7.5; hgb 12.8; hct 39.9; mcv 95; plt 228; glucose 86; bun 11; creat 0.8; k+3.8; na++133 Liver normal albumin 3.2; vit d 35.17; tsh 5.772    Review of Systems  Constitutional: Negative for malaise/fatigue.  Respiratory: Negative for cough and shortness of breath.   Cardiovascular: Negative for chest pain, palpitations and leg swelling.    Gastrointestinal: Negative for heartburn, abdominal pain and constipation.  Musculoskeletal: Negative for joint pain and myalgias.  Skin: Negative.   Neurological: Negative for dizziness and headaches.  Psychiatric/Behavioral: Negative for depression. The patient is not nervous/anxious.     Physical Exam  Constitutional: No distress.  frail  Neck: Neck supple. No JVD present.  Cardiovascular: Normal rate and intact distal pulses.   Heart rate slightly irregular  Respiratory: Effort normal and breath sounds normal. No respiratory distress. She has no wheezes.  GI: Soft. Bowel sounds are normal. She exhibits no distension. There is no tenderness.  Musculoskeletal: Normal range of motion. She exhibits no edema.  Neurological: She is alert.  Skin: Skin is warm and dry. She is not diaphoretic.  Psychiatric: She has a normal mood and affect.       ASSESSMENT/ PLAN:   1. Afib: heart rate is controlled; will continue coumadin on alternating dose of 1.5/3 mg daily and will monitor her status.   2. Hypertension: is stable will continue cozaar 50 mg daily; norvasc 2.5 mg daily  and lopressor 50 mg twice daily and will monitor her status   3. Anemia: her hgb is normal at 12.8; will stop her iron at this time; will recheck cbc in 3 months and will monitor  4. Vit d deficiency: her level is 35.17; is presently on vit d 50,000 units weekly; will lower her vit d to 1000 units daily and will monitor  5. Osteoporosis: is stable will continue fosamax 70 mg weekly; will continue ca++ 1 gm daily   6. Hypothyroidism: her tsh is 5.772; will increase her synthroid to 100 mcg daily and will check tsh in 6 weeks.   7. Dementia: is without change will continue namenda xr 28 mg daily and will continue aricept 23 mg daily and will monitor  8. Depression: she is emotionally stable; will continue lexapro 10 mg daily and remeron 15 mg nightly and will monitor    Time spent with patient 50 minutes.         Ok Edwards NP [facility]  Contact [phone] Monday through Friday 8am- 5pm  After hours call [phone]

## 2014-02-12 ENCOUNTER — Non-Acute Institutional Stay (SKILLED_NURSING_FACILITY): Payer: Medicare Other | Admitting: Adult Health

## 2014-02-12 ENCOUNTER — Encounter: Payer: Self-pay | Admitting: Adult Health

## 2014-02-12 DIAGNOSIS — I4891 Unspecified atrial fibrillation: Secondary | ICD-10-CM

## 2014-02-12 DIAGNOSIS — Z7901 Long term (current) use of anticoagulants: Secondary | ICD-10-CM

## 2014-02-12 NOTE — Progress Notes (Signed)
Patient ID: Tasha Ball, female   DOB: [birth year], 80 y.o.   MRN: [id number]     [facility]  No Known Allergies   Chief Complaint  Patient presents with  . Acute Visit    coumadin management     HPI:  She is on long term coumadin therapy for her afib. Her heart rate is under control at this time. She is tolerating her coumadin therapy. There are no concerns being voiced by the nursing staff at this time. Her inr today is 1.9 and is taking coumadin 3 mg daily.    Past Medical History  Diagnosis Date  . Urinary tract infection   . Cancer     non-hodgkins lymphoma   . Lymphoma in remission     ca free 5 yrs  . Dementia   . Atrial fibrillation   . Hypertension   . Mitral valve prolapse   . Thyroid disease   . Memory loss   . Osteoporosis   . 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 Hip fracture, left 10/16/2011  . Femoral fracture 10/27/2011  . Pelvic fracture 10/08/2013  . HTN (hypertension) 10/20/2011    Past Surgical History  Procedure Laterality Date  . Eye surgery      bil.cataract with lens implants  . Hip arthroplasty  10/18/2011    Procedure: ARTHROPLASTY BIPOLAR HIP;  Surgeon: Mauri Pole;  Location: [facility];  Service: Orthopedics;  Laterality: Left;  depuy    VITAL SIGNS BP 134/84  Pulse 70  Ht 5\' 4"  (1.626 m)  Wt 102 lb (46.267 kg)  BMI 17.50 kg/m2   Patient's Medications  New Prescriptions   No medications on file  Previous Medications   ALENDRONATE (FOSAMAX) 70 MG TABLET    Take 70 mg by mouth every Friday. Take with a full glass of water on an empty stomach.   AMLODIPINE (NORVASC) 2.5 MG TABLET    Take 2.5 mg by mouth daily.   B COMPLEX VITAMINS CAPSULE    Take 1 capsule by mouth daily.   CHOLECALCIFEROL (VITAMIN D) 1000 UNITS CAPSULE    Take 1 capsule (1,000 Units total) by mouth daily.   DONEPEZIL (ARICEPT) 23 MG TABS TABLET    Take 23 mg by mouth at bedtime.    ESCITALOPRAM (LEXAPRO) 10 MG TABLET    Take 10 mg by mouth daily.   LEVOTHYROXINE  (SYNTHROID, LEVOTHROID) 100 MCG TABLET    Take 1 tablet (100 mcg total) by mouth daily.   LOSARTAN (COZAAR) 50 MG TABLET    Take 50 mg by mouth daily.   MEMANTINE HCL ER (NAMENDA XR) 28 MG CP24    Take 28 mg by mouth daily.   METOPROLOL TARTRATE (LOPRESSOR) 25 MG TABLET    Take 50 mg by mouth 2 (two) times daily.   MIRTAZAPINE (REMERON) 15 MG TABLET    Take 15 mg by mouth at bedtime.   TRAMADOL (ULTRAM) 50 MG TABLET    Take one tablet by mouth every 6 hours as needed for pain   WARFARIN (COUMADIN) 2.5 MG TABLET    Take 3 mg by mouth daily. Alternating dose of 1.5 mg and 3 mg  Modified Medications   No medications on file  Discontinued Medications   No medications on file    SIGNIFICANT DIAGNOSTIC EXAMS   None recent   LABS REVIEWED:   02-09-14: wbc 7.5; hgb 12.8; hct 39.9; mcv 95; plt 228; glucose 86; bun 11; creat 0.8; k+3.8; na++133 Liver normal albumin  3.2; vit d 35.17; tsh 5.772    Review of Systems  Constitutional: Negative for malaise/fatigue.  Respiratory: Negative for cough and shortness of breath.   Cardiovascular: Negative for chest pain, palpitations and leg swelling.  Gastrointestinal: Negative for heartburn, abdominal pain and constipation.  Musculoskeletal: Negative for joint pain and myalgias.  Skin: Negative.   Neurological: Negative for dizziness and headaches.  Psychiatric/Behavioral: Negative for depression. The patient is not nervous/anxious.     Physical Exam  Constitutional: No distress.  frail  Neck: Neck supple. No JVD present.  Cardiovascular: Normal rate and intact distal pulses.   Heart rate slightly irregular  Respiratory: Effort normal and breath sounds normal. No respiratory distress. She has no wheezes.  GI: Soft. Bowel sounds are normal. She exhibits no distension. There is no tenderness.  Musculoskeletal: Normal range of motion. She exhibits no edema.  Neurological: She is alert.  Skin: Skin is warm and dry. She is not diaphoretic.    Psychiatric: She has a normal mood and affect.       ASSESSMENT/ PLAN:  1. Afib/anticoagulation management: her her inr of 1.9; will continue her coumadin 3 mg daily and will check inr in one week and will monitor her status.     Ok Edwards NP [facility]  Contact [phone] Monday through Friday 8am- 5pm  After hours call [phone]

## 2014-02-16 ENCOUNTER — Non-Acute Institutional Stay (SKILLED_NURSING_FACILITY): Payer: Medicare Other | Admitting: Internal Medicine

## 2014-02-16 ENCOUNTER — Encounter: Payer: Self-pay | Admitting: Internal Medicine

## 2014-02-16 DIAGNOSIS — F0390 Unspecified dementia without behavioral disturbance: Secondary | ICD-10-CM

## 2014-02-16 DIAGNOSIS — F0280 Dementia in other diseases classified elsewhere without behavioral disturbance: Secondary | ICD-10-CM

## 2014-02-16 DIAGNOSIS — M810 Age-related osteoporosis without current pathological fracture: Secondary | ICD-10-CM

## 2014-02-16 DIAGNOSIS — F329 Major depressive disorder, single episode, unspecified: Secondary | ICD-10-CM

## 2014-02-16 DIAGNOSIS — I10 Essential (primary) hypertension: Secondary | ICD-10-CM

## 2014-02-16 DIAGNOSIS — E039 Hypothyroidism, unspecified: Secondary | ICD-10-CM

## 2014-02-16 DIAGNOSIS — F0393 Unspecified dementia, unspecified severity, with mood disturbance: Secondary | ICD-10-CM

## 2014-02-16 DIAGNOSIS — I4891 Unspecified atrial fibrillation: Secondary | ICD-10-CM

## 2014-02-16 DIAGNOSIS — F3289 Other specified depressive episodes: Secondary | ICD-10-CM

## 2014-02-16 NOTE — Progress Notes (Signed)
Patient ID: Tasha Ball, female   DOB: October 01, 1924, 80 y.o.   MRN: [id number]     [facility]   PCP: Jerlyn Ly, MD  Code Status: DNR  No Known Allergies  Chief Complaint: new admission  HPI:  81 y/o female patient was transferred here from another facility for long term care. She was residing in assisted living facility prior to this. She has history of dementia, mitral valve prolapse, afib, hypothyroidisim. She was seen in her room today. She has memroy loss and is confused but able to carry out a conversation. She mentions having her breakfast and it being the most important meal of the day. She is in no distress and denies any complaints. As per speech therapy who is present in the room, she lacks motivation and has clear confusion. She has a wander guard on her leg.  Review of Systems: limited with her memory loss Constitutional: Negative for fever, chills, diaphoresis.  HENT: Negative for congestion Eyes: Negative for eye pain, blurred vision, double vision and discharge.  Respiratory: Negative for cough, sputum production, shortness of breath and wheezing.   Cardiovascular: Negative for chest pain, palpitations, orthopnea and leg swelling.  Gastrointestinal: Negative for heartburn, nausea, vomiting, abdominal pain, diarrhea and constipation.  Genitourinary: Negative for dysuria Musculoskeletal: Negative for back pain Skin: Negative for itching and rash.  Neurological: Negative for dizziness, tingling, focal weakness and headaches.  Psychiatric/Behavioral: The patient is not nervous/anxious.     Past Medical History  Diagnosis Date  . Urinary tract infection   . Cancer     non-hodgkins lymphoma   . Lymphoma in remission     ca free 5 yrs  . Dementia   . Atrial fibrillation   . Hypertension   . Mitral valve prolapse   . Thyroid disease   . Memory loss   . Osteoporosis   . 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 Hip fracture, left 10/16/2011  . Femoral fracture  10/27/2011  . Pelvic fracture 10/08/2013  . HTN (hypertension) 10/20/2011   Past Surgical History  Procedure Laterality Date  . Eye surgery      bil.cataract with lens implants  . Hip arthroplasty  10/18/2011    Procedure: ARTHROPLASTY BIPOLAR HIP;  Surgeon: Mauri Pole;  Location: [facility];  Service: Orthopedics;  Laterality: Left;  depuy   Social History:   reports that she has never smoked. She has never used smokeless tobacco. She reports that she does not drink alcohol or use illicit drugs.  Family History  Problem Relation Age of Onset  . Heart disease Father   . Angina Father   . Stomach cancer Brother 60  . Coronary artery disease Brother     Medications: Patient's Medications  New Prescriptions   No medications on file  Previous Medications   ALENDRONATE (FOSAMAX) 70 MG TABLET    Take 70 mg by mouth every Friday. Take with a full glass of water on an empty stomach.   AMLODIPINE (NORVASC) 2.5 MG TABLET    Take 2.5 mg by mouth daily.   B COMPLEX VITAMINS CAPSULE    Take 1 capsule by mouth daily.   CHOLECALCIFEROL (VITAMIN D) 1000 UNITS CAPSULE    Take 1 capsule (1,000 Units total) by mouth daily.   DONEPEZIL (ARICEPT) 23 MG TABS TABLET    Take 23 mg by mouth at bedtime.    ESCITALOPRAM (LEXAPRO) 10 MG TABLET    Take 10 mg by mouth daily.   LEVOTHYROXINE (SYNTHROID,  LEVOTHROID) 100 MCG TABLET    Take 1 tablet (100 mcg total) by mouth daily.   LOSARTAN (COZAAR) 50 MG TABLET    Take 50 mg by mouth daily.   MEMANTINE HCL ER (NAMENDA XR) 28 MG CP24    Take 28 mg by mouth daily.   METOPROLOL TARTRATE (LOPRESSOR) 25 MG TABLET    Take 50 mg by mouth 2 (two) times daily.   MIRTAZAPINE (REMERON) 15 MG TABLET    Take 15 mg by mouth at bedtime.   TRAMADOL (ULTRAM) 50 MG TABLET    Take one tablet by mouth every 6 hours as needed for pain   WARFARIN (COUMADIN) 2.5 MG TABLET    Take 3 mg by mouth daily. Alternating dose of 1.5 mg and 3 mg  Modified Medications   No medications on  file  Discontinued Medications   No medications on file     Physical Exam: BP 137/60  Pulse 60  Temp(Src) 97.1 F (36.2 C)  Resp 18  SpO2 96%  General- elderly female in no acute distress Head- atraumatic, normocephalic Eyes- PERRLA, EOMI, no pallor, no icterus, no discharge Neck- no lymphadenopathy, no thyromegaly, no jugular vein distension, no carotid bruit Throat- moist mucus membrane, normal oropharynx Nose- normal nasal mucosa, no maxillary or frontal sinus tenderness Cardiovascular- normal s1,s2, no murmurs/ rubs/ gallops Respiratory- bilateral clear to auscultation, no wheeze, no rhonchi, no crackles, no use of accessory muscles Abdomen- bowel sounds present, soft, non tender Musculoskeletal- able to move all 4 extremities, no leg edema, using a wheel chair, has wander guard in her left leg Neurological- no focal deficit Skin- warm and dry Psychiatry- alert and normal mood and affect    Labs reviewed: Basic Metabolic Panel:  Recent Labs  10/07/13 2103 10/08/13 0640 10/09/13 0620  NA 137 138  --   K 3.9 3.3* 4.4  CL 98 101  --   CO2 29 27  --   GLUCOSE 114* 85  --   BUN 16 13  --   CREATININE 0.90 0.74  --   CALCIUM 9.2 8.2*  --   MG  --  1.4*  --   PHOS  --  3.0  --    Liver Function Tests:  Recent Labs  10/07/13 2103 10/08/13 0640  AST 24 17  ALT 12 10  ALKPHOS 161* 135*  BILITOT 1.5* 1.1  PROT 6.7 5.6*  ALBUMIN 3.2* 2.6*   No results found for this basename: LIPASE, AMYLASE,  in the last 8760 hours No results found for this basename: AMMONIA,  in the last 8760 hours CBC:  Recent Labs  10/07/13 2103 10/08/13 0640  WBC 10.1 8.3  NEUTROABS 7.2  --   HGB 11.6* 10.4*  HCT 36.2 31.7*  MCV 95.3 94.6  PLT 220 193   02/09/14  na 133, k 3.8, bun 11, cr 0.8, lft wnl, tsh 5.77, vit d 35.17, wbc 7.5, hb 12.8, plt 228  Assessment/Plan  Osteoporosis continue fosamax 70 mg weekly with calcium and vitamin d. Fall  precautions  Hypertension continue cozaar 50 mg daily, norvasc 2.5 mg daily  and lopressor 50 mg twice daily and monitor bp, check bmp periodically  Dementia with depression Continue namenda and aricept for memory. continue lexapro 10 mg daily for now for her mood with remeron. Monitor po intake. Fall precautions, skin care  Hypothyroidism Her synthroid was recently adjusted to 100 mcg daily. Continue this and monitor   Afib rate is controlled. Continue lopressor and  coumadin with goal inr 2-3  Anemia Stable h/h. Monitor periodically  Family/ staff Communication: reviewed care plan with patient and nursing supervisor  Goals of care: long term care   Labs/tests ordered: cbc, tsh, bmp in 3 months    Blanchie Serve, MD  [place] (Monday-Friday 8 am - 5 pm) [phone] (afterhours)

## 2014-02-26 ENCOUNTER — Non-Acute Institutional Stay: Payer: Medicare Other | Admitting: Adult Health

## 2014-02-26 ENCOUNTER — Encounter: Payer: Self-pay | Admitting: Adult Health

## 2014-02-26 DIAGNOSIS — F0390 Unspecified dementia without behavioral disturbance: Secondary | ICD-10-CM

## 2014-02-26 DIAGNOSIS — Z7901 Long term (current) use of anticoagulants: Secondary | ICD-10-CM

## 2014-02-26 DIAGNOSIS — I10 Essential (primary) hypertension: Secondary | ICD-10-CM

## 2014-02-26 DIAGNOSIS — I4891 Unspecified atrial fibrillation: Secondary | ICD-10-CM

## 2014-02-26 NOTE — Progress Notes (Signed)
Patient ID: Tasha Ball, female   DOB: 07-23-1924, 80 y.o.   MRN: [id number]     [facility]  No Known Allergies   Chief Complaint  Patient presents with  . Discharge Note    HPI:  She is being discharged to [facility] assisted living. She will need home health for pt/ot/rn for coumadin management. She is presently taking coumadin 3 mg daily with an inr of 2.1 and will need her inr check on 03-05-14. She will not need dme. She will need prescriptions to be written.    Past Medical History  Diagnosis Date  . Urinary tract infection   . Cancer     non-hodgkins lymphoma   . Lymphoma in remission     ca free 5 yrs  . Dementia   . Atrial fibrillation   . Hypertension   . Mitral valve prolapse   . Thyroid disease   . Memory loss   . Osteoporosis   . 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 Hip fracture, left 10/16/2011  . Femoral fracture 10/27/2011  . Pelvic fracture 10/08/2013  . HTN (hypertension) 10/20/2011    Past Surgical History  Procedure Laterality Date  . Eye surgery      bil.cataract with lens implants  . Hip arthroplasty  10/18/2011    Procedure: ARTHROPLASTY BIPOLAR HIP;  Surgeon: Mauri Pole;  Location: [facility];  Service: Orthopedics;  Laterality: Left;  depuy    VITAL SIGNS BP 137/89  Pulse 80  Ht 5\' 4"  (1.626 m)  Wt 102 lb (46.267 kg)  BMI 17.50 kg/m2   Patient's Medications  New Prescriptions   No medications on file  Previous Medications   ALENDRONATE (FOSAMAX) 70 MG TABLET    Take 70 mg by mouth every Friday. Take with a full glass of water on an empty stomach.   AMLODIPINE (NORVASC) 2.5 MG TABLET    Take 2.5 mg by mouth daily.   B COMPLEX VITAMINS CAPSULE    Take 1 capsule by mouth daily.   CHOLECALCIFEROL (VITAMIN D) 1000 UNITS CAPSULE    Take 1 capsule (1,000 Units total) by mouth daily.   DONEPEZIL (ARICEPT) 23 MG TABS TABLET    Take 23 mg by mouth at bedtime.    ESCITALOPRAM (LEXAPRO) 10 MG TABLET    Take 10 mg by mouth daily.   LEVOTHYROXINE  (SYNTHROID, LEVOTHROID) 100 MCG TABLET    Take 1 tablet (100 mcg total) by mouth daily.   LOSARTAN (COZAAR) 50 MG TABLET    Take 50 mg by mouth daily.   MEMANTINE HCL ER (NAMENDA XR) 28 MG CP24    Take 28 mg by mouth daily.   METOPROLOL TARTRATE (LOPRESSOR) 25 MG TABLET    Take 50 mg by mouth 2 (two) times daily.   MIRTAZAPINE (REMERON) 15 MG TABLET    Take 15 mg by mouth at bedtime.   TRAMADOL (ULTRAM) 50 MG TABLET    Take one tablet by mouth every 6 hours as needed for pain   WARFARIN (COUMADIN) 2.5 MG TABLET    Take 3 mg by mouth daily. Alternating dose of 1.5 mg and 3 mg  Modified Medications   No medications on file  Discontinued Medications   No medications on file    SIGNIFICANT DIAGNOSTIC EXAMS   None recent   LABS REVIEWED:   02-09-14: wbc 7.5; hgb 12.8; hct 39.9; mcv 95; plt 228; glucose 86; bun 11; creat 0.8; k+3.8; na++133 Liver normal albumin 3.2; vit d 35.17;  tsh 5.772    Review of Systems  Constitutional: Negative for malaise/fatigue.  Respiratory: Negative for cough and shortness of breath.   Cardiovascular: Negative for chest pain, palpitations and leg swelling.  Gastrointestinal: Negative for heartburn, abdominal pain and constipation.  Musculoskeletal: Negative for joint pain and myalgias.  Skin: Negative.   Neurological: Negative for dizziness and headaches.  Psychiatric/Behavioral: Negative for depression. The patient is not nervous/anxious.     Physical Exam  Constitutional: No distress.  frail  Neck: Neck supple. No JVD present.  Cardiovascular: Normal rate and intact distal pulses.   Heart rate slightly irregular  Respiratory: Effort normal and breath sounds normal. No respiratory distress. She has no wheezes.  GI: Soft. Bowel sounds are normal. She exhibits no distension. There is no tenderness.  Musculoskeletal: Normal range of motion. She exhibits no edema.  Neurological: She is alert.  Skin: Skin is warm and dry. She is not diaphoretic.    Psychiatric: She has a normal mood and affect.      ASSESSMENT/ PLAN:  Will discharge her to [facility] assisted living. She will need home health for pt/ot/nursing for coumadin management she is presently taking 3 mg for her inr of 2.1 and will need her inr checked on 03-05-14. Her prescriptions have been written.    Time spent with patient 40 minutes.         Ok Edwards NP [facility]  Contact [phone] Monday through Friday 8am- 5pm  After hours call [phone]

## 2014-03-09 ENCOUNTER — Telehealth: Payer: Self-pay | Admitting: *Deleted

## 2014-03-09 NOTE — Telephone Encounter (Signed)
lft message to rtn call @ [phone]

## 2014-03-10 ENCOUNTER — Encounter (HOSPITAL_COMMUNITY): Payer: Self-pay | Admitting: Emergency Medicine

## 2014-03-10 ENCOUNTER — Emergency Department (HOSPITAL_COMMUNITY)
Admission: EM | Admit: 2014-03-10 | Discharge: 2014-03-10 | Disposition: A | Discharge status: Home / Self Care | Payer: Medicare Other | Source: Home / Self Care | Attending: Family Medicine | Admitting: Family Medicine

## 2014-03-10 ENCOUNTER — Emergency Department (INDEPENDENT_AMBULATORY_CARE_PROVIDER_SITE_OTHER): Payer: Medicare Other

## 2014-03-10 DIAGNOSIS — R05 Cough: Secondary | ICD-10-CM

## 2014-03-10 DIAGNOSIS — R059 Cough, unspecified: Secondary | ICD-10-CM

## 2014-03-10 HISTORY — DX: Acute embolism and thrombosis of unspecified deep veins of unspecified lower extremity: I82.409

## 2014-03-10 LAB — CBC
HCT: 42.1 % (ref 36.0–46.0)
HEMOGLOBIN: 14.2 g/dL (ref 12.0–15.0)
MCH: 31.7 pg (ref 26.0–34.0)
MCHC: 33.7 g/dL (ref 30.0–36.0)
MCV: 94 fL (ref 78.0–100.0)
Platelets: 212 10*3/uL (ref 150–400)
RBC: 4.48 MIL/uL (ref 3.87–5.11)
RDW: 13.4 % (ref 11.5–15.5)
WBC: 8.5 10*3/uL (ref 4.0–10.5)

## 2014-03-10 LAB — POCT I-STAT, CHEM 8
BUN: 7 mg/dL (ref 6–23)
CALCIUM ION: 1.17 mmol/L (ref 1.13–1.30)
Chloride: 94 mEq/L — ABNORMAL LOW (ref 96–112)
Creatinine, Ser: 1 mg/dL (ref 0.50–1.10)
GLUCOSE: 89 mg/dL (ref 70–99)
HEMATOCRIT: 46 % (ref 36.0–46.0)
HEMOGLOBIN: 15.6 g/dL — AB (ref 12.0–15.0)
Potassium: 3.9 mEq/L (ref 3.7–5.3)
Sodium: 138 mEq/L (ref 137–147)
TCO2: 33 mmol/L (ref 0–100)

## 2014-03-10 LAB — PROTIME-INR
INR: 2.48 — AB (ref 0.00–1.49)
Prothrombin Time: 26 seconds — ABNORMAL HIGH (ref 11.6–15.2)

## 2014-03-10 MED ORDER — BENZONATATE 100 MG PO CAPS: 100.0000 mg | ORAL_CAPSULE | Freq: Three times a day (TID) | ORAL | Status: DC

## 2014-03-10 MED ORDER — FLUTICASONE PROPIONATE 50 MCG/ACT NA SUSP: 2.0000 | Freq: Every day | NASAL | Status: DC

## 2014-03-10 NOTE — ED Provider Notes (Signed)
Tasha Ball is a 80 y.o. female who presents to Urgent Care today for cough for the past one week. She denies any shortness of breath fevers chills chest pain; no medications have been tried. She feels well otherwise. She is a pertinent past medical history for osteoporosis with multiple pelvic and femur fractures in the past several years, DVT, atrial fibrillation, and dementia. She currently is taking anticoagulation and typically is within range.  She has a DO NOT RESUSCITATE order but no advanced directive or living will.   Past Medical History  Diagnosis Date  . Urinary tract infection   . Cancer     non-hodgkins lymphoma   . Lymphoma in remission     ca free 5 yrs  . Dementia   . Atrial fibrillation   . Hypertension   . Mitral valve prolapse   . Thyroid disease   . Memory loss   . Osteoporosis   . 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 Hip fracture, left 10/16/2011  . Femoral fracture 10/27/2011  . Pelvic fracture 10/08/2013  . HTN (hypertension) 10/20/2011  . DVT (deep venous thrombosis)    History  Substance Use Topics  . Smoking status: Never Smoker   . Smokeless tobacco: Never Used  . Alcohol Use: No   ROS as above Medications: No current facility-administered medications for this encounter.   Current Outpatient Prescriptions  Medication Sig Dispense Refill  . alendronate (FOSAMAX) 70 MG tablet Take 70 mg by mouth every Friday. Take with a full glass of water on an empty stomach.      Marland Kitchen amLODipine (NORVASC) 2.5 MG tablet Take 2.5 mg by mouth daily.      Marland Kitchen b complex vitamins capsule Take 1 capsule by mouth daily.      . Cholecalciferol (VITAMIN D) 1000 UNITS capsule Take 1 capsule (1,000 Units total) by mouth daily.  30 capsule  11  . donepezil (ARICEPT) 23 MG TABS tablet Take 23 mg by mouth at bedtime.       Marland Kitchen escitalopram (LEXAPRO) 10 MG tablet Take 10 mg by mouth daily.      Marland Kitchen levothyroxine (SYNTHROID, LEVOTHROID) 100 MCG tablet Take 1 tablet (100 mcg total) by mouth daily.   90 tablet  3  . losartan (COZAAR) 50 MG tablet Take 50 mg by mouth daily.      . Memantine HCl ER (NAMENDA XR) 28 MG CP24 Take 28 mg by mouth daily.      . metoprolol tartrate (LOPRESSOR) 25 MG tablet Take 50 mg by mouth 2 (two) times daily.      . mirtazapine (REMERON) 15 MG tablet Take 15 mg by mouth at bedtime.      . traMADol (ULTRAM) 50 MG tablet Take one tablet by mouth every 6 hours as needed for pain  120 tablet  5  . warfarin (COUMADIN) 2.5 MG tablet Take 3 mg by mouth daily. Alternating dose of 1.5 mg and 3 mg      . benzonatate (TESSALON) 100 MG capsule Take 1 capsule (100 mg total) by mouth every 8 (eight) hours.  21 capsule  0  . fluticasone (FLONASE) 50 MCG/ACT nasal spray Place 2 sprays into both nostrils daily.  16 g  2    Exam:  BP 106/76  Pulse 65  Temp(Src) 97.9 F (36.6 C) (Oral)  Resp 16  SpO2 95% Gen: Well NAD frail and elderly appearing HEENT: EOMI,  MMM Lungs: Normal work of breathing. CTABL  Heart: RRR no MRG Abd:  NABS, Soft. NT, ND Exts: Brisk capillary refill, warm and well perfused. No leg swelling. Calves appear equal in diameter.   Results for orders placed during the hospital encounter of 03/10/14 (from the past 24 hour(s))  CBC     Status: None   Collection Time    03/10/14  9:51 AM      Result Value Ref Range   WBC 8.5  4.0 - 10.5 K/uL   RBC 4.48  3.87 - 5.11 MIL/uL   Hemoglobin 14.2  12.0 - 15.0 g/dL   HCT 42.1  36.0 - 46.0 %   MCV 94.0  78.0 - 100.0 fL   MCH 31.7  26.0 - 34.0 pg   MCHC 33.7  30.0 - 36.0 g/dL   RDW 13.4  11.5 - 15.5 %   Platelets 212  150 - 400 K/uL  PROTIME-INR     Status: Abnormal   Collection Time    03/10/14  9:51 AM      Result Value Ref Range   Prothrombin Time 26.0 (*) 11.6 - 15.2 seconds   INR 2.48 (*) 0.00 - 1.49  POCT I-STAT, CHEM 8     Status: Abnormal   Collection Time    03/10/14 10:50 AM      Result Value Ref Range   Sodium 138  137 - 147 mEq/L   Potassium 3.9  3.7 - 5.3 mEq/L   Chloride 94 (*) 96 -  112 mEq/L   BUN 7  6 - 23 mg/dL   Creatinine, Ser 1.00  0.50 - 1.10 mg/dL   Glucose, Bld 89  70 - 99 mg/dL   Calcium, Ion 1.17  1.13 - 1.30 mmol/L   TCO2 33  0 - 100 mmol/L   Hemoglobin 15.6 (*) 12.0 - 15.0 g/dL   HCT 46.0  36.0 - 46.0 %   Dg Chest 2 View  03/10/2014   CLINICAL DATA:  Cough.  EXAM: CHEST  2 VIEW  COMPARISON:  October 16, 2011.  FINDINGS: Stable moderate cardiomegaly is noted. Degenerative changes of lower thoracic spine are noted. No pleural effusion or pneumothorax is noted. No acute pulmonary disease is noted.  IMPRESSION: Stable cardiomegaly.  No acute cardiopulmonary abnormality seen.   Electronically Signed   By: Sabino Dick M.D.   On: 03/10/2014 10:29    Assessment and Plan: 80 y.o. female with cough likely due to seasonal allergies. Patient does not have a pneumonia that is visible on chest x-ray, nor fever or elevated white count. I am doubtful that she has a PE given that she has no signs of a DVT currently and her INR is within therapeutic limits. She appears to be well. Plan to use Tessalon Perles, Zyrtec, and Flonase nasal spray. I hope to avoid using medications on the [place] as she already has had multiple falls. Encourage prompt followup with primary care provider  Discussed warning signs or symptoms. Please see discharge instructions. Patient expresses understanding.    Gregor Hams, MD 03/10/14 [phone]

## 2014-03-10 NOTE — ED Notes (Signed)
Pt is here with her son.  She lives in a memory care facility.  He reports a wet cough the past week. He is unaware of a fever

## 2014-03-10 NOTE — Discharge Instructions (Signed)
Thank you for coming in today. Take over-the-counter Zyrtec daily.  Use Tessalon as needed for cough Use Flonase nasal spray Followup with your primary care Dr. Soon Call or go to the emergency room if you get worse, have trouble breathing, have chest pains, or palpitations.   Cough, Adult  A cough is a reflex that helps clear your throat and airways. It can help heal the body or may be a reaction to an irritated airway. A cough may only last 2 or 3 weeks (acute) or may last more than 8 weeks (chronic).  CAUSES Acute cough:  Viral or bacterial infections. Chronic cough:  Infections.  Allergies.  Asthma.  Post-nasal drip.  Smoking.  Heartburn or acid reflux.  Some medicines.  Chronic lung problems (COPD).  Cancer. SYMPTOMS   Cough.  Fever.  Chest pain.  Increased breathing rate.  High-pitched whistling sound when breathing (wheezing).  Colored mucus that you cough up (sputum). TREATMENT   A bacterial cough may be treated with antibiotic medicine.  A viral cough must run its course and will not respond to antibiotics.  Your caregiver may recommend other treatments if you have a chronic cough. HOME CARE INSTRUCTIONS   Only take over-the-counter or prescription medicines for pain, discomfort, or fever as directed by your caregiver. Use cough suppressants only as directed by your caregiver.  Use a cold steam vaporizer or humidifier in your bedroom or home to help loosen secretions.  Sleep in a semi-upright position if your cough is worse at night.  Rest as needed.  Stop smoking if you smoke. SEEK IMMEDIATE MEDICAL CARE IF:   You have pus in your sputum.  Your cough starts to worsen.  You cannot control your cough with suppressants and are losing sleep.  You begin coughing up blood.  You have difficulty breathing.  You develop pain which is getting worse or is uncontrolled with medicine.  You have a fever. MAKE SURE YOU:   Understand these  instructions.  Will watch your condition.  Will get help right away if you are not doing well or get worse. Document Released: 05/15/2011 Document Revised: 02/08/2012 Document Reviewed: 05/15/2011 [facility] Patient Information 2014 [place].

## 2014-03-27 NOTE — Telephone Encounter (Signed)
Can we send an Rx for a new Wheelchair to [facility]?   She was d/c from nursing home 02/26/14 but per PT therapist is only using a loner wheelchair. Please advise. Thanks

## 2014-03-27 NOTE — Telephone Encounter (Signed)
Please call Bard Herbert our NP at [facility] to decide on this. thanks

## 2014-03-28 NOTE — Telephone Encounter (Signed)
Debbie, Please advise on RX for wheelchair order. thanks

## 2014-03-28 NOTE — Telephone Encounter (Signed)
Per Jackelyn Poling pt needs to get this from her PCP.  Spoke to pt's son, [name] and pt is now in another facility and [facility] is taking care of this for her.

## 2014-08-16 ENCOUNTER — Other Ambulatory Visit: Payer: Self-pay | Admitting: Oncology

## 2014-08-16 ENCOUNTER — Emergency Department (HOSPITAL_COMMUNITY): Payer: Medicare Other

## 2014-08-16 ENCOUNTER — Inpatient Hospital Stay (HOSPITAL_COMMUNITY)
Admission: EM | Admit: 2014-08-16 | Discharge: 2014-08-18 | DRG: 301 | Disposition: A | Discharge status: Nursing Home (Includes ICF) | Payer: Medicare Other | Attending: Internal Medicine | Admitting: Internal Medicine

## 2014-08-16 ENCOUNTER — Encounter (HOSPITAL_COMMUNITY): Payer: Self-pay | Admitting: Emergency Medicine

## 2014-08-16 DIAGNOSIS — Z993 Dependence on wheelchair: Secondary | ICD-10-CM

## 2014-08-16 DIAGNOSIS — M7989 Other specified soft tissue disorders: Secondary | ICD-10-CM

## 2014-08-16 DIAGNOSIS — Z87898 Personal history of other specified conditions: Secondary | ICD-10-CM

## 2014-08-16 DIAGNOSIS — I10 Essential (primary) hypertension: Secondary | ICD-10-CM | POA: Diagnosis present

## 2014-08-16 DIAGNOSIS — Z86718 Personal history of other venous thrombosis and embolism: Secondary | ICD-10-CM

## 2014-08-16 DIAGNOSIS — O2230 Deep phlebothrombosis in pregnancy, unspecified trimester: Secondary | ICD-10-CM | POA: Diagnosis present

## 2014-08-16 DIAGNOSIS — M79609 Pain in unspecified limb: Secondary | ICD-10-CM

## 2014-08-16 DIAGNOSIS — D649 Anemia, unspecified: Secondary | ICD-10-CM | POA: Diagnosis present

## 2014-08-16 DIAGNOSIS — E039 Hypothyroidism, unspecified: Secondary | ICD-10-CM | POA: Diagnosis present

## 2014-08-16 DIAGNOSIS — F0390 Unspecified dementia without behavioral disturbance: Secondary | ICD-10-CM | POA: Diagnosis present

## 2014-08-16 DIAGNOSIS — I4891 Unspecified atrial fibrillation: Secondary | ICD-10-CM | POA: Diagnosis present

## 2014-08-16 DIAGNOSIS — Z7901 Long term (current) use of anticoagulants: Secondary | ICD-10-CM

## 2014-08-16 DIAGNOSIS — I82629 Acute embolism and thrombosis of deep veins of unspecified upper extremity: Secondary | ICD-10-CM | POA: Diagnosis not present

## 2014-08-16 DIAGNOSIS — M79641 Pain in right hand: Secondary | ICD-10-CM

## 2014-08-16 DIAGNOSIS — I82621 Acute embolism and thrombosis of deep veins of right upper extremity: Secondary | ICD-10-CM

## 2014-08-16 DIAGNOSIS — Z853 Personal history of malignant neoplasm of breast: Secondary | ICD-10-CM

## 2014-08-16 DIAGNOSIS — I059 Rheumatic mitral valve disease, unspecified: Secondary | ICD-10-CM | POA: Diagnosis present

## 2014-08-16 DIAGNOSIS — M810 Age-related osteoporosis without current pathological fracture: Secondary | ICD-10-CM | POA: Diagnosis present

## 2014-08-16 DIAGNOSIS — Z79899 Other long term (current) drug therapy: Secondary | ICD-10-CM

## 2014-08-16 LAB — BASIC METABOLIC PANEL
Anion gap: 12 (ref 5–15)
BUN: 15 mg/dL (ref 6–23)
CO2: 26 mEq/L (ref 19–32)
CREATININE: 0.66 mg/dL (ref 0.50–1.10)
Calcium: 9 mg/dL (ref 8.4–10.5)
Chloride: 102 mEq/L (ref 96–112)
GFR, EST AFRICAN AMERICAN: 89 mL/min — AB (ref >90)
GFR, EST NON AFRICAN AMERICAN: 77 mL/min — AB (ref >90)
Glucose, Bld: 96 mg/dL (ref 70–99)
Potassium: 3.9 mEq/L (ref 3.7–5.3)
Sodium: 140 mEq/L (ref 137–147)

## 2014-08-16 LAB — CBC WITH DIFFERENTIAL/PLATELET
BASOS ABS: 0 10*3/uL (ref 0.0–0.1)
BASOS PCT: 0 % (ref 0–1)
Eosinophils Absolute: 0.2 10*3/uL (ref 0.0–0.7)
Eosinophils Relative: 2 % (ref 0–5)
HEMATOCRIT: 35.8 % — AB (ref 36.0–46.0)
Hemoglobin: 11.7 g/dL — ABNORMAL LOW (ref 12.0–15.0)
Lymphocytes Relative: 19 % (ref 12–46)
Lymphs Abs: 1.9 10*3/uL (ref 0.7–4.0)
MCH: 31.2 pg (ref 26.0–34.0)
MCHC: 32.7 g/dL (ref 30.0–36.0)
MCV: 95.5 fL (ref 78.0–100.0)
MONO ABS: 1.5 10*3/uL — AB (ref 0.1–1.0)
Monocytes Relative: 15 % — ABNORMAL HIGH (ref 3–12)
NEUTROS PCT: 64 % (ref 43–77)
Neutro Abs: 6.5 10*3/uL (ref 1.7–7.7)
Platelets: 190 10*3/uL (ref 150–400)
RBC: 3.75 MIL/uL — ABNORMAL LOW (ref 3.87–5.11)
RDW: 13.9 % (ref 11.5–15.5)
WBC: 10 10*3/uL (ref 4.0–10.5)

## 2014-08-16 LAB — PROTIME-INR
INR: 3 — ABNORMAL HIGH (ref 0.00–1.49)
Prothrombin Time: 31.1 seconds — ABNORMAL HIGH (ref 11.6–15.2)

## 2014-08-16 MED ORDER — MIRTAZAPINE 15 MG PO TABS
15.0000 mg | ORAL_TABLET | Freq: Every day | ORAL | Status: DC
  Administered 2014-08-16 – 2014-08-17 (×2): 15 mg via ORAL
  Filled 2014-08-16 (×3): qty 1

## 2014-08-16 MED ORDER — MEMANTINE HCL ER 7 MG PO CP24
28.0000 mg | ORAL_CAPSULE | Freq: Every day | ORAL | Status: DC
  Administered 2014-08-17 – 2014-08-18 (×2): 28 mg via ORAL
  Filled 2014-08-16 (×2): qty 4

## 2014-08-16 MED ORDER — METOPROLOL TARTRATE 50 MG PO TABS
50.0000 mg | ORAL_TABLET | Freq: Two times a day (BID) | ORAL | Status: DC
  Administered 2014-08-16 – 2014-08-18 (×4): 50 mg via ORAL
  Filled 2014-08-16 (×5): qty 1

## 2014-08-16 MED ORDER — LOSARTAN POTASSIUM 50 MG PO TABS
50.0000 mg | ORAL_TABLET | Freq: Every day | ORAL | Status: DC
  Administered 2014-08-17 – 2014-08-18 (×2): 50 mg via ORAL
  Filled 2014-08-16 (×2): qty 1

## 2014-08-16 MED ORDER — ALBUTEROL SULFATE (2.5 MG/3ML) 0.083% IN NEBU: 2.5000 mg | INHALATION_SOLUTION | RESPIRATORY_TRACT | Status: DC | PRN

## 2014-08-16 MED ORDER — ACETAMINOPHEN 325 MG PO TABS: 650.0000 mg | ORAL_TABLET | Freq: Four times a day (QID) | ORAL | Status: DC | PRN

## 2014-08-16 MED ORDER — IOHEXOL 350 MG/ML SOLN
100.0000 mL | Freq: Once | INTRAVENOUS | Status: AC | PRN
  Administered 2014-08-16: 80 mL via INTRAVENOUS

## 2014-08-16 MED ORDER — LEVOTHYROXINE SODIUM 100 MCG PO TABS
100.0000 ug | ORAL_TABLET | Freq: Every day | ORAL | Status: DC
  Administered 2014-08-17 – 2014-08-18 (×2): 100 ug via ORAL
  Filled 2014-08-16 (×3): qty 1

## 2014-08-16 MED ORDER — ESCITALOPRAM OXALATE 10 MG PO TABS
10.0000 mg | ORAL_TABLET | Freq: Every day | ORAL | Status: DC
  Administered 2014-08-17 – 2014-08-18 (×2): 10 mg via ORAL
  Filled 2014-08-16 (×2): qty 1

## 2014-08-16 MED ORDER — VITAMIN D3 25 MCG (1000 UNIT) PO TABS
1000.0000 [IU] | ORAL_TABLET | Freq: Every day | ORAL | Status: DC
Start: 2014-08-17 — End: 2014-08-18
  Administered 2014-08-17 – 2014-08-18 (×2): 1000 [IU] via ORAL
  Filled 2014-08-16 (×2): qty 1

## 2014-08-16 MED ORDER — ONDANSETRON HCL 4 MG PO TABS: 4.0000 mg | ORAL_TABLET | Freq: Four times a day (QID) | ORAL | Status: DC | PRN

## 2014-08-16 MED ORDER — DONEPEZIL HCL 23 MG PO TABS
23.0000 mg | ORAL_TABLET | Freq: Every day | ORAL | Status: DC
  Administered 2014-08-16 – 2014-08-17 (×2): 23 mg via ORAL
  Filled 2014-08-16 (×3): qty 1

## 2014-08-16 MED ORDER — ONDANSETRON HCL 4 MG/2ML IJ SOLN: 4.0000 mg | Freq: Four times a day (QID) | INTRAMUSCULAR | Status: DC | PRN

## 2014-08-16 MED ORDER — TRAMADOL HCL 50 MG PO TABS: 50.0000 mg | ORAL_TABLET | Freq: Four times a day (QID) | ORAL | Status: DC | PRN

## 2014-08-16 MED ORDER — HEPARIN (PORCINE) IN NACL 100-0.45 UNIT/ML-% IJ SOLN
700.0000 [IU]/h | INTRAMUSCULAR | Status: DC
  Administered 2014-08-16: 700 [IU]/h via INTRAVENOUS
  Filled 2014-08-16: qty 250

## 2014-08-16 MED ORDER — ACETAMINOPHEN 650 MG RE SUPP
650.0000 mg | Freq: Four times a day (QID) | RECTAL | Status: DC | PRN
Start: 2014-08-16 — End: 2014-08-18

## 2014-08-16 MED ORDER — PSYLLIUM 0.52 G PO CAPS
0.5200 g | ORAL_CAPSULE | Freq: Every day | ORAL | Status: DC
  Filled 2014-08-16: qty 1

## 2014-08-16 NOTE — Progress Notes (Signed)
*  Preliminary Results* Right upper extremity venous duplex completed. Right upper extremity is positive for deep vein thrombosis involving a single right brachial vein in its proximal segment.  Preliminary results discussed with Montine Circle, PA-C.  08/16/2014 4:24 PM  Maudry Mayhew, RVT, RDCS, RDMS

## 2014-08-16 NOTE — ED Notes (Addendum)
Pt from [facility] with c/o RT hand pain/cool digits x 3 days.  Per EMS, Pt had a fall and xray to RT hand/wrist done this past Monday and sent back.  EMS reports no cap refill to the RT hand but radial pulse present.  Pt able to move fingers.  Pt is on coumadin and is wheelchair bound.

## 2014-08-16 NOTE — ED Provider Notes (Signed)
Medical screening examination/treatment/procedure(s) were conducted as a shared visit with non-physician practitioner(s) and myself.  I personally evaluated the patient during the encounter.   EKG Interpretation None     Symptomatic DVT right arm while therapeutic on Coumadin for Afib; no ability to dose Lovenox or heparin at memory care center so Med to be called.  Babette Relic, MD 08/29/14 1340

## 2014-08-16 NOTE — ED Notes (Signed)
Attempted to call report, RN unavailable.

## 2014-08-16 NOTE — H&P (Signed)
History and Physical  Tasha Ball:[id number] DOB: 03-Jun-1924 DOA: 08/16/2014  Referring physician: Lorre Munroe, ED PA PCP: Jerlyn Ly, MD   Chief Complaint: Right hand pain.  HPI: Tasha Ball is a 80 y.o. female , resident of [facility] assisted living facility, extensive PMH-non-Hodgkin's lymphoma in remission, advanced dementia, A. fib on chronic Coumadin, HTN, MVP, hypothyroid,and DVT, sent from ALF due to right hand pain. Patient unable to provide history secondary to advanced dementia. She thinks that she is in the hospital to get blood tests. No family at bedside. History obtained by discussing with the ED PA. Patient apparently had a fall 2 days ago and x-rays of right hand and wrist were done and patient was sent back. Patient apparently had continued right hand pain-details unavailable and facility staff noticed cool digits for last 3 days. EMS reported no capillary refill to the right hand but radial pulse present and patient able to move fingers. She is wheelchair bound. Patient however denied falls or injuries to this M.D. And to ED PA. In the ED, x-rays of right arrest were negative for fractures. Right upper extremity venous Doppler was positive for DVT involving single right brachial vein in its proximal segment. ED PA and discussed patient's care with a hematologist on call who recommended blood tests (CEA, CA 125, CA 27.9), CTA chest to rule out PE and IV heparin infusion. ED to consult hematology. Hospitalist admission was requested.   Review of Systems: All systems reviewed and apart from history of presenting illness, are negative.  Past Medical History  Diagnosis Date  . Urinary tract infection   . Cancer     non-hodgkins lymphoma   . Lymphoma in remission     ca free 5 yrs  . Dementia   . Atrial fibrillation   . Hypertension   . Mitral valve prolapse   . Thyroid disease   . Memory loss   . Osteoporosis   . 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 Hip fracture,  left 10/16/2011  . Femoral fracture 10/27/2011  . Pelvic fracture 10/08/2013  . HTN (hypertension) 10/20/2011  . DVT (deep venous thrombosis)    Past Surgical History  Procedure Laterality Date  . Eye surgery      bil.cataract with lens implants  . Hip arthroplasty  10/18/2011    Procedure: ARTHROPLASTY BIPOLAR HIP;  Surgeon: Mauri Pole;  Location: [facility];  Service: Orthopedics;  Laterality: Left;  depuy   Social History:  reports that she has never smoked. She has never used smokeless tobacco. She reports that she does not drink alcohol or use illicit drugs. Unable to obtain secondary to advanced dementia and mental status changes.  No Known Allergies  Family History  Problem Relation Age of Onset  . Heart disease Father   . Angina Father   . Stomach cancer Brother 71  . Coronary artery disease Brother    unable to obtain secondary to reasons above.  Prior to Admission medications   Medication Sig Start Date End Date Taking? Authorizing Provider  b complex vitamins capsule Take 1 capsule by mouth daily.   Yes Historical Provider, MD  Cholecalciferol (VITAMIN D) 1000 UNITS capsule Take 1 capsule (1,000 Units total) by mouth daily. 02/09/14  Yes Gerlene Fee, NP  donepezil (ARICEPT) 23 MG TABS tablet Take 23 mg by mouth at bedtime.    Yes Historical Provider, MD  escitalopram (LEXAPRO) 10 MG tablet Take 10 mg by mouth daily.   Yes  Historical Provider, MD  levothyroxine (SYNTHROID, LEVOTHROID) 100 MCG tablet Take 1 tablet (100 mcg total) by mouth daily. 02/09/14  Yes Gerlene Fee, NP  losartan (COZAAR) 50 MG tablet Take 50 mg by mouth daily.   Yes Historical Provider, MD  Memantine HCl ER (NAMENDA XR) 28 MG CP24 Take 28 mg by mouth daily.   Yes Historical Provider, MD  metoprolol tartrate (LOPRESSOR) 25 MG tablet Take 50 mg by mouth 2 (two) times daily. 10/11/13  Yes Marianne L York, PA-C  mirtazapine (REMERON) 15 MG tablet Take 15 mg by mouth at bedtime.   Yes Historical  Provider, MD  Nutritional Supplements (NUTRITIONAL SUPPLEMENT PO) Take 1 Bottle by mouth.   Yes Historical Provider, MD  psyllium (REGULOID) 0.52 G capsule Take 0.52 g by mouth daily.   Yes Historical Provider, MD  Skin Protectants, Misc. (DIMETHICONE-ZINC OXIDE) cream Apply 1 application topically 3 (three) times daily as needed for dry skin (redeness or open areas).   Yes Historical Provider, MD  traMADol (ULTRAM) 50 MG tablet 50 mg every 6 (six) hours as needed (pain in right hand). Take one tablet by mouth every 6 hours as needed for pain 02/07/14  Yes Estill Dooms, MD  warfarin (COUMADIN) 3 MG tablet Take 3-4.5 mg by mouth daily. Taking 4.5 mg on Mon. & 3 mg on (Tues, Wed, Thurs, Fri, Sat, Sun)   Yes Historical Provider, MD   Physical Exam: Filed Vitals:   08/16/14 1338 08/16/14 1537 08/16/14 1751 08/16/14 1754  BP: 139/81 131/72  146/86  Pulse: 74 78  71  Temp: 98.2 F (36.8 C)   98.4 F (36.9 C)  TempSrc: Oral   Oral  Resp: 18 24  21   Height:   5\' 2"  (1.575 m)   Weight:   47.628 kg (105 lb)   SpO2: 97% 96%  97%     General exam: Small built and thinly nourished frail elderly female, lying comfortably supine on the gurney in no obvious distress.  Head, eyes and ENT: Nontraumatic and normocephalic. Pupils equally reacting to light and accommodation. Oral mucosa moist.  Neck: Supple. No JVD, carotid bruit or thyromegaly.  Lymphatics: No lymphadenopathy.  Respiratory system: Occasional basal crackles but otherwise clear to auscultation. No increased work of breathing.  Cardiovascular system: S1 and S2 heard, RRR. No JVD, murmurs, gallops, clicks or pedal edema.  Gastrointestinal system: Abdomen is nondistended, soft and nontender. Normal bowel sounds heard. No organomegaly or masses appreciated.  Central nervous system: Alert and oriented to self and place but not to time. No focal neurological deficits.  Extremities: Symmetric 5 x 5 power. Peripheral pulses symmetrically  felt. Right wrist mildly swollen, warm and minimal erythema around right wrist and dorsum of right hand. No tenderness and? Mild pain full range of movements. Able to move fingers freely. Although all fingers are slightly cool to touch-the findings are symmetrical compared to left hand. There is good capillary refill. Arthritic deformities-chronic.  Skin: No rashes or acute findings.  Musculoskeletal system: Negative exam.  Psychiatry: Pleasant and cooperative.   Labs on Admission:  Basic Metabolic Panel:  Recent Labs Lab 08/16/14 1534  NA 140  K 3.9  CL 102  CO2 26  GLUCOSE 96  BUN 15  CREATININE 0.66  CALCIUM 9.0   Liver Function Tests: No results found for this basename: AST, ALT, ALKPHOS, BILITOT, PROT, ALBUMIN,  in the last 168 hours No results found for this basename: LIPASE, AMYLASE,  in the last 168 hours  No results found for this basename: AMMONIA,  in the last 168 hours CBC:  Recent Labs Lab 08/16/14 1534  WBC 10.0  NEUTROABS 6.5  HGB 11.7*  HCT 35.8*  MCV 95.5  PLT 190   Cardiac Enzymes: No results found for this basename: CKTOTAL, CKMB, CKMBINDEX, TROPONINI,  in the last 168 hours  BNP (last 3 results) No results found for this basename: PROBNP,  in the last 8760 hours CBG: No results found for this basename: GLUCAP,  in the last 168 hours  Radiological Exams on Admission: Dg Wrist Complete Right  08/16/2014   CLINICAL DATA:  Swelling of the right radius.  No trauma.  EXAM: RIGHT WRIST - COMPLETE 3+ VIEW  COMPARISON:  None.  FINDINGS: There is no evidence of fracture or dislocation. There is chondrocalcinosis at the ulna carpal joint. There are degenerative joint changes of the first metacarpal carpal joint. There is no acute fracture or dislocation.  IMPRESSION: No acute fracture or dislocation. Degenerative joint changes as described.   Electronically Signed   By: Abelardo Diesel M.D.   On: 08/16/2014 15:30    Assessment/Plan Principal Problem:   DVT  of upper extremity (deep vein thrombosis) Active Problems:   Atrial fibrillation   HTN (hypertension)   Hypothyroidism   Long term (current) use of anticoagulants   Dementia without behavioral disturbance   1. Right upper extremity DVT/single right brachial vein: This is in the context of therapeutic INR. Not clear if this is responsible for patient's symptoms of right hand pain or just an incidental finding. As stated above, ED PA has discussed patient's case with hematologist on call who recommends above blood tests, CTA chest and IV heparin infusion. We'll await formal consultation by hematologist. 2. Right wrist pain:?s/p fall. No fractures on x-ray. As stated above, not sure if her symptoms and findings are related to DVT. Pain management. May consider orthopedic consultation if not improving. No reported history of gout. 3. Hypertension: Reasonable control. Continue ARB and beta blockers. 4. History of A. fib: Seems to be in sinus rhythm. Continue metoprolol and is therapeutically anticoagulated. 5. Hypothyroid: Continue Synthroid. 6. Anemia: May be at baseline. Follow CBCs 7. Dementia: No agitation. Continue home medication regimen.  8. History of lymphoma: Said to be in remission.    Code Status: Full code by default. We'll need to address further with family.  Family Communication: None at bedside.  Disposition Plan: Return to assisted living facility when medically stable.  Time spent: 105 minutes  Shaliah Wann, MD, FACP, FHM. [facility] Pager [phone]  If 7PM-7AM, please contact night-coverage [email or web address] Password [facility] 08/16/2014, 5:58 PM

## 2014-08-16 NOTE — ED Provider Notes (Signed)
CSN: [id number]     Arrival date & time 08/16/14  69 History   First MD Initiated Contact with Patient 08/16/14 1458     Chief Complaint  Patient presents with  . Hand Pain     (Consider location/radiation/quality/duration/timing/severity/associated sxs/prior Treatment) HPI Comments: Patient with history of dementia, and DVT, presents emergency department with chief complaint of right wrist swelling x3-4 days. She is accompanied by her son, who provides the history. She stays in then assisted living facility. She denies any falls or recent injuries. Complains of moderate swelling. Right wrist pain, which is worsened with palpation. She denies fevers chills. She takes warfarin for A. fib.   The history is provided by the patient. No language interpreter was used.    Past Medical History  Diagnosis Date  . Urinary tract infection   . Cancer     non-hodgkins lymphoma   . Lymphoma in remission     ca free 5 yrs  . Dementia   . Atrial fibrillation   . Hypertension   . Mitral valve prolapse   . Thyroid disease   . Memory loss   . Osteoporosis   . 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 Hip fracture, left 10/16/2011  . Femoral fracture 10/27/2011  . Pelvic fracture 10/08/2013  . HTN (hypertension) 10/20/2011  . DVT (deep venous thrombosis)    Past Surgical History  Procedure Laterality Date  . Eye surgery      bil.cataract with lens implants  . Hip arthroplasty  10/18/2011    Procedure: ARTHROPLASTY BIPOLAR HIP;  Surgeon: Mauri Pole;  Location: [facility];  Service: Orthopedics;  Laterality: Left;  depuy   Family History  Problem Relation Age of Onset  . Heart disease Father   . Angina Father   . Stomach cancer Brother 58  . Coronary artery disease Brother    History  Substance Use Topics  . Smoking status: Never Smoker   . Smokeless tobacco: Never Used  . Alcohol Use: No   OB History   Grav Para Term Preterm Abortions TAB SAB Ect Mult Living                 Review of  Systems  Constitutional: Negative for fever and chills.  Respiratory: Negative for shortness of breath.   Cardiovascular: Negative for chest pain.  Gastrointestinal: Negative for nausea, vomiting, diarrhea and constipation.  Genitourinary: Negative for dysuria.  Musculoskeletal: Positive for arthralgias and joint swelling.  Skin: Positive for color change.  All other systems reviewed and are negative.     Allergies  Review of patient's allergies indicates no known allergies.  Home Medications   Prior to Admission medications   Medication Sig Start Date End Date Taking? Authorizing Provider  b complex vitamins capsule Take 1 capsule by mouth daily.   Yes Historical Provider, MD  Cholecalciferol (VITAMIN D) 1000 UNITS capsule Take 1 capsule (1,000 Units total) by mouth daily. 02/09/14  Yes Gerlene Fee, NP  donepezil (ARICEPT) 23 MG TABS tablet Take 23 mg by mouth at bedtime.    Yes Historical Provider, MD  escitalopram (LEXAPRO) 10 MG tablet Take 10 mg by mouth daily.   Yes Historical Provider, MD  levothyroxine (SYNTHROID, LEVOTHROID) 100 MCG tablet Take 1 tablet (100 mcg total) by mouth daily. 02/09/14  Yes Gerlene Fee, NP  losartan (COZAAR) 50 MG tablet Take 50 mg by mouth daily.   Yes Historical Provider, MD  Memantine HCl ER (NAMENDA XR) 28 MG CP24 Take 28  mg by mouth daily.   Yes Historical Provider, MD  metoprolol tartrate (LOPRESSOR) 25 MG tablet Take 50 mg by mouth 2 (two) times daily. 10/11/13  Yes Marianne L York, PA-C  mirtazapine (REMERON) 15 MG tablet Take 15 mg by mouth at bedtime.   Yes Historical Provider, MD  Nutritional Supplements (NUTRITIONAL SUPPLEMENT PO) Take 1 Bottle by mouth.   Yes Historical Provider, MD  psyllium (REGULOID) 0.52 G capsule Take 0.52 g by mouth daily.   Yes Historical Provider, MD  Skin Protectants, Misc. (DIMETHICONE-ZINC OXIDE) cream Apply 1 application topically 3 (three) times daily as needed for dry skin (redeness or open areas).    Yes Historical Provider, MD  traMADol (ULTRAM) 50 MG tablet 50 mg every 6 (six) hours as needed (pain in right hand). Take one tablet by mouth every 6 hours as needed for pain 02/07/14  Yes Estill Dooms, MD  warfarin (COUMADIN) 3 MG tablet Take 3-4.5 mg by mouth daily. Taking 4.5 mg on Mon. & 3 mg on (Tues, Wed, Thurs, Fri, Sat, Sun)   Yes Historical Provider, MD   BP 139/81  Pulse 74  Temp(Src) 98.2 F (36.8 C) (Oral)  Resp 18  SpO2 97% Physical Exam  Nursing note and vitals reviewed. Constitutional: She is oriented to person, place, and time. She appears well-developed and well-nourished.  HENT:  Head: Normocephalic and atraumatic.  Eyes: Conjunctivae and EOM are normal. Pupils are equal, round, and reactive to light.  Neck: Normal range of motion. Neck supple.  Cardiovascular: Normal rate, regular rhythm and intact distal pulses.  Exam reveals no gallop and no friction rub.   No murmur heard. Intact distal pulses with normal to slightly delayed capillary refill  Pulmonary/Chest: Effort normal and breath sounds normal. No respiratory distress. She has no wheezes. She has no rales. She exhibits no tenderness.  Abdominal: Soft. Bowel sounds are normal. She exhibits no distension and no mass. There is no tenderness. There is no rebound and no guarding.  Musculoskeletal: Normal range of motion. She exhibits no edema and no tenderness.  Moderate right wrist swelling, tenderness to palpation  Neurological: She is alert and oriented to person, place, and time.  Skin: Skin is warm and dry.  Erythema is noted circumferentially around the right wrist extending partially up the forearm  Psychiatric: She has a normal mood and affect. Her behavior is normal. Judgment and thought content normal.    ED Course  Procedures (including critical care time) Labs Review Labs Reviewed  CBC WITH DIFFERENTIAL  BASIC METABOLIC PANEL  PROTIME-INR    Imaging Review Dg Wrist Complete Right  08/16/2014    CLINICAL DATA:  Swelling of the right radius.  No trauma.  EXAM: RIGHT WRIST - COMPLETE 3+ VIEW  COMPARISON:  None.  FINDINGS: There is no evidence of fracture or dislocation. There is chondrocalcinosis at the ulna carpal joint. There are degenerative joint changes of the first metacarpal carpal joint. There is no acute fracture or dislocation.  IMPRESSION: No acute fracture or dislocation. Degenerative joint changes as described.   Electronically Signed   By: Abelardo Diesel M.D.   On: 08/16/2014 15:30     EKG Interpretation None      MDM   Final diagnoses:  DVT of upper extremity (deep vein thrombosis), right    Patient with upper extremity DVT, therapeutic on coumadin.  Plain films negative.  No MOI.  Patient seen by and discussed with Dr. Stevie Kern.  Will consult hematology.  Discussed the  patient with [facility], Dr. Algis Liming, who will admit the patient.  5:52 PM Dr. Jana Hakim from Heme/onc will consult on the patient this evening.   Recommends CAE, CA125, CA 27.29 and CT chest.  Will follow-up on labs and CT.    Montine Circle, PA-C 08/16/14 1826

## 2014-08-16 NOTE — ED Notes (Signed)
US at bedside

## 2014-08-16 NOTE — ED Notes (Signed)
MD at bedside. 

## 2014-08-16 NOTE — Progress Notes (Addendum)
ANTICOAGULATION CONSULT NOTE - Initial Consult  Pharmacy Consult for Heparin Indication: DVT  No Known Allergies  Patient Measurements: Height: 5\' 2"  (157.5 cm) Weight: 105 lb (47.628 kg) IBW/kg (Calculated) : 50.1 Heparin Dosing Weight: actual weight  Vital Signs: Temp: 98.2 F (36.8 C) (09/17 1338) Temp src: Oral (09/17 1338) BP: 131/72 mmHg (09/17 1537) Pulse Rate: 78 (09/17 1537)  Labs:  Recent Labs  08/16/14 1534  HGB 11.7*  HCT 35.8*  PLT 190  LABPROT 31.1*  INR 3.00*  CREATININE 0.66    Estimated Creatinine Clearance: 36.5 ml/min (by C-G formula based on Cr of 0.66).   Medical History: Past Medical History  Diagnosis Date  . Urinary tract infection   . Cancer     non-hodgkins lymphoma   . Lymphoma in remission     ca free 5 yrs  . Dementia   . Atrial fibrillation   . Hypertension   . Mitral valve prolapse   . Thyroid disease   . Memory loss   . Osteoporosis   . 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 Hip fracture, left 10/16/2011  . Femoral fracture 10/27/2011  . Pelvic fracture 10/08/2013  . HTN (hypertension) 10/20/2011  . DVT (deep venous thrombosis)     Medications:  Scheduled:  Infusions:   Assessment: Tasha Ball presented to ED on 9/17 from assisted living with hand pain and cool digits w/o cap refill x 3 days.  PMH includes chronic warfarin anticoagulation for history of afib and DVT, with therapeutic INR = 3.  PTA warfarin dosing: 3mg  daily except 4.5 mg on Monday; last dose 9/16.  Despite warfarin therapy, [place] is positive for acute DVT in RUE at right brachial vein.    Today, 9/17  INR = 3, therapeutic  SCr 0.66, CrCl ~ 36 ml/min  CBC:  Hgb 11.7, Plt 190  Goal of Therapy:  Heparin level 0.3-0.7 units/ml Monitor platelets by anticoagulation protocol: Yes   Plan:   No heparin bolus d/t therapeutic INR  Start heparin IV infusion at 700 units/hr (7 ml/hr)  Start with low dose (~15 units/kg/hr) d/t therapeutic INR  Per EDP, hematology has  recommended starting heparin immediately, despite INR  Heparin level 8 hours after starting  Daily heparin level and CBC  Continue to monitor H&H and platelets   Gretta Arab PharmD, BCPS Pager [phone] 08/16/2014 5:39 PM    Addendum: For long-term anticoagulation, instead of warfarin, Hematology recommends Lovenox 1.5 mg/kg/d SQ to be continued indefinitely.  F/U when patient is stable to make this transition from IV heparin to Lovenox.  Gretta Arab PharmD, BCPS Pager [phone] 08/16/2014 10:02 PM

## 2014-08-16 NOTE — Progress Notes (Signed)
INITIAL NOTE:  This pleasantly demented 80 y/o [place] resident has a new RUE DVT in the setting of chronic coumadinization for A fib. Her current INR is 3.0 and all her INRs on recoed are in the therapeutic range. .Accordingly I do believe this is a coumadin failure. At this point I suggest:  D/c coumadin Start IV unfractionated heparin per pharmacy CT/angio to r/o PE CA 125, CA 27-29 and CEA (written) When patient stable, transition to lovenox at 1.5 mg/kg/d sQ -- to be continued indefinitely  (I am less comfortable using Xarelto or Eliquis in this very elderly coumadin failure case)  I will be out of town as of 7 AM 08/17/2014. I will follow up on results of CT and labs as appropriate and will see the patient 08/20/2014 if still in the hospital. Otherwise please contact my patients for further input as needed.  Full note to follow.

## 2014-08-16 NOTE — ED Notes (Signed)
Patient transported to CT 

## 2014-08-16 NOTE — Consult Note (Signed)
[place]  Telephone:([phone]) [phone] Fax:(336) [phone]   This is an incomplete, preliminary note. Please refer to the definitive note dictated later the same day  ID: JASMINEMARIE SHERRARD DOB: 01/21/1924  MR#: [id number]  IRJ#:[id number]  Patient Care Team: Jerlyn Ly, MD as PCP - General (Internal Medicine) PCP: Jerlyn Ly, MD GYN: SU:  OTHER MD:  CHIEF COMPLAINT:   CURRENT TREATMENT:    BREAST CANCER HISTORY:  INTERVAL HISTORY:  REVIEW OF SYSTEMS:  PAST MEDICAL HISTORY: Past Medical History  Diagnosis Date  . Urinary tract infection   . Cancer     non-hodgkins lymphoma   . Lymphoma in remission     ca free 5 yrs  . Dementia   . Atrial fibrillation   . Hypertension   . Mitral valve prolapse   . Thyroid disease   . Memory loss   . Osteoporosis   . 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 Hip fracture, left 10/16/2011  . Femoral fracture 10/27/2011  . Pelvic fracture 10/08/2013  . HTN (hypertension) 10/20/2011  . DVT (deep venous thrombosis)     PAST SURGICAL HISTORY: Past Surgical History  Procedure Laterality Date  . Eye surgery      bil.cataract with lens implants  . Hip arthroplasty  10/18/2011    Procedure: ARTHROPLASTY BIPOLAR HIP;  Surgeon: Mauri Pole;  Location: [facility];  Service: Orthopedics;  Laterality: Left;  depuy    FAMILY HISTORY Family History  Problem Relation Age of Onset  . Heart disease Father   . Angina Father   . Stomach cancer Brother 41  . Coronary artery disease Brother     GYNECOLOGIC HISTORY:  No LMP recorded. Patient is postmenopausal.  SOCIAL HISTORY:      ADVANCED DIRECTIVES:    HEALTH MAINTENANCE: History  Substance Use Topics  . Smoking status: Never Smoker   . Smokeless tobacco: Never Used  . Alcohol Use: No     Colonoscopy:  PAP:  Bone density:  Lipid panel:  No Known Allergies  Current Facility-Administered Medications  Medication Dose Route Frequency Provider Last Rate Last Dose  . heparin  ADULT infusion 100 units/mL (25000 units/250 mL)  700 Units/hr Intravenous Continuous Randa Spike, RPH 7 mL/hr at 08/16/14 1824 700 Units/hr at 08/16/14 1824   Current Outpatient Prescriptions  Medication Sig Dispense Refill  . b complex vitamins capsule Take 1 capsule by mouth daily.      . Cholecalciferol (VITAMIN D) 1000 UNITS capsule Take 1 capsule (1,000 Units total) by mouth daily.  30 capsule  11  . donepezil (ARICEPT) 23 MG TABS tablet Take 23 mg by mouth at bedtime.       Marland Kitchen escitalopram (LEXAPRO) 10 MG tablet Take 10 mg by mouth daily.      Marland Kitchen levothyroxine (SYNTHROID, LEVOTHROID) 100 MCG tablet Take 1 tablet (100 mcg total) by mouth daily.  90 tablet  3  . losartan (COZAAR) 50 MG tablet Take 50 mg by mouth daily.      . Memantine HCl ER (NAMENDA XR) 28 MG CP24 Take 28 mg by mouth daily.      . metoprolol tartrate (LOPRESSOR) 25 MG tablet Take 50 mg by mouth 2 (two) times daily.      . mirtazapine (REMERON) 15 MG tablet Take 15 mg by mouth at bedtime.      . Nutritional Supplements (NUTRITIONAL SUPPLEMENT PO) Take 1 Bottle by mouth.      . psyllium (REGULOID) 0.52 G capsule Take 0.52  g by mouth daily.      . Skin Protectants, Misc. (DIMETHICONE-ZINC OXIDE) cream Apply 1 application topically 3 (three) times daily as needed for dry skin (redeness or open areas).      . traMADol (ULTRAM) 50 MG tablet 50 mg every 6 (six) hours as needed (pain in right hand). Take one tablet by mouth every 6 hours as needed for pain      . warfarin (COUMADIN) 3 MG tablet Take 3-4.5 mg by mouth daily. Taking 4.5 mg on Mon. & 3 mg on (Tues, Wed, Thurs, Fri, Sat, Sun)        OBJECTIVE: Filed Vitals:   08/16/14 1754  BP: 146/86  Pulse: 71  Temp: 98.4 F (36.9 C)  Resp: 21     Body mass index is 19.2 kg/(m^2).      Ocular: Sclerae unicteric, pupils equal, round and reactive to light Ear-nose-throat: Oropharynx clear, dentition fair Lymphatic: No cervical or supraclavicular adenopathy Lungs no  rales or rhonchi, good excursion bilaterally Heart regular rate and rhythm, no murmur appreciated Abd soft, nontender, positive bowel sounds MSK no focal spinal tenderness, no joint edema Neuro: non-focal, well-oriented, appropriate affect Breasts:    LAB RESULTS:  CMP     Component Value Date/Time   NA 140 08/16/2014 1534   K 3.9 08/16/2014 1534   CL 102 08/16/2014 1534   CO2 26 08/16/2014 1534   GLUCOSE 96 08/16/2014 1534   BUN 15 08/16/2014 1534   CREATININE 0.66 08/16/2014 1534   CALCIUM 9.0 08/16/2014 1534   PROT 5.6* 10/08/2013 0640   ALBUMIN 2.6* 10/08/2013 0640   AST 17 10/08/2013 0640   ALT 10 10/08/2013 0640   ALKPHOS 135* 10/08/2013 0640   BILITOT 1.1 10/08/2013 0640   GFRNONAA 77* 08/16/2014 1534   GFRAA 89* 08/16/2014 1534    I No results found for this basename: SPEP, UPEP,  kappa and lambda light chains    Lab Results  Component Value Date   WBC 10.0 08/16/2014   NEUTROABS 6.5 08/16/2014   HGB 11.7* 08/16/2014   HCT 35.8* 08/16/2014   MCV 95.5 08/16/2014   PLT 190 08/16/2014    @LASTCHEMISTRY @  No results found for this basename: LABCA2    No components found with this basename: LABCA125     Recent Labs Lab 08/16/14 1534  INR 3.00*    Urinalysis    Component Value Date/Time   COLORURINE AMBER* 10/07/2013 2334   APPEARANCEUR CLOUDY* 10/07/2013 2334   LABSPEC 1.022 10/07/2013 2334   LABSPEC 1.005 10/08/2006 1225   PHURINE 6.0 10/07/2013 [place] 10/07/2013 2334   HGBUR SMALL* 10/07/2013 2334   BILIRUBINUR SMALL* 10/07/2013 2334   KETONESUR 15* 10/07/2013 2334   PROTEINUR 30* 10/07/2013 2334   UROBILINOGEN 1.0 10/07/2013 2334   NITRITE POSITIVE* 10/07/2013 2334   LEUKOCYTESUR LARGE* 10/07/2013 2334    STUDIES: Filed: 08/16/2014 4:25 PM Note Time: 08/16/2014 4:24 PM Status: Signed   Editor: Doyne Keel Simonetti (Cardiovascular Sonographer)      *Preliminary Results*  Right upper extremity venous duplex completed.  Right upper extremity is  positive for deep vein thrombosis involving a single right brachial vein in its proximal segment.  Preliminary results discussed with Montine Circle, PA-C.  08/16/2014 4:24 PM  Maudry Mayhew, RVT, RDCS, RDMS     Dg Wrist Complete Right  08/16/2014   CLINICAL DATA:  Swelling of the right radius.  No trauma.  EXAM: RIGHT WRIST - COMPLETE 3+ VIEW  COMPARISON:  None.  FINDINGS: There is no evidence of fracture or dislocation. There is chondrocalcinosis at the ulna carpal joint. There are degenerative joint changes of the first metacarpal carpal joint. There is no acute fracture or dislocation.  IMPRESSION: No acute fracture or dislocation. Degenerative joint changes as described.   Electronically Signed   By: Abelardo Diesel M.D.   On: 08/16/2014 15:30    ASSESSMENT: 80 y.o. [place] resident on chonic coumadin due to A Fib, admitted with an acute DVT in the R brachial vein; PT/INR is 3.0  PLAN: It is unusual to have a breakthrough clot in a patient whose PT/INRs have been consistently in the therapeutic range.   Results for BREEZY, HERTENSTEIN (MRN [id number]) as of 08/16/2014 18:31  Ref. Range 10/09/2013 06:20 10/10/2013 06:10 10/11/2013 05:50 03/10/2014 09:51 08/16/2014 15:34  INR Latest Range: 0.00-1.49  2.31 (H) 2.48 (H) 2.11 (H) 2.48 (H) 3.00 (H)   The family is concerned that the patient has "lost a lot of weight" in the past year, so it is possible there is an underlying malignancy. More likely in my opining is that this pleasantly demented patient suffered some trauma to the RUE which was not recorded or remembered.  At this point I suggest: (1) IV heparin per pharmacy (2) d/c coumadin (3) CT angio to evaluate for possible PE (though patient not symptomatic) (4) CEA, CA 27-29 and CA 125  (5) assuming patient is stable next 24-48 hours would suggest d/c back to [place] on lovenox at 1.5 mg/kg/d sQ indefinitely (6) alternatives can be considered (rivaroxaban) if it proves  impossible to obtain the lovenox but I am less comfortable using that drug in the setting of a very elderly case of coumadin failure  Appreciate consulting on Ms. Hickam's case. I will be out of town as of 7 AM tomorrow but will be glad to follow-up on Ms Lubas if she is still hospitalized by 08/20/2014. I have discussed the overall assessment and plan with the patient and her son [name], who are in agreement.  .This is an incomplete, preliminary note. Please refer to the definitive note dictated later the same day  Garris Melhorn C, MD   08/16/2014 6:30 PM

## 2014-08-16 NOTE — ED Notes (Signed)
Bed: WA08 Expected date:  Expected time:  Means of arrival:  Comments: Hand pain from old person

## 2014-08-17 DIAGNOSIS — E039 Hypothyroidism, unspecified: Secondary | ICD-10-CM | POA: Diagnosis present

## 2014-08-17 DIAGNOSIS — Z79899 Other long term (current) drug therapy: Secondary | ICD-10-CM | POA: Diagnosis not present

## 2014-08-17 DIAGNOSIS — Z86718 Personal history of other venous thrombosis and embolism: Secondary | ICD-10-CM | POA: Diagnosis not present

## 2014-08-17 DIAGNOSIS — I059 Rheumatic mitral valve disease, unspecified: Secondary | ICD-10-CM | POA: Diagnosis present

## 2014-08-17 DIAGNOSIS — Z993 Dependence on wheelchair: Secondary | ICD-10-CM | POA: Diagnosis not present

## 2014-08-17 DIAGNOSIS — Z7901 Long term (current) use of anticoagulants: Secondary | ICD-10-CM | POA: Diagnosis not present

## 2014-08-17 DIAGNOSIS — I82629 Acute embolism and thrombosis of deep veins of unspecified upper extremity: Secondary | ICD-10-CM | POA: Diagnosis present

## 2014-08-17 DIAGNOSIS — I10 Essential (primary) hypertension: Secondary | ICD-10-CM | POA: Diagnosis present

## 2014-08-17 DIAGNOSIS — F0390 Unspecified dementia without behavioral disturbance: Secondary | ICD-10-CM | POA: Diagnosis present

## 2014-08-17 DIAGNOSIS — D649 Anemia, unspecified: Secondary | ICD-10-CM | POA: Diagnosis present

## 2014-08-17 DIAGNOSIS — Z87898 Personal history of other specified conditions: Secondary | ICD-10-CM | POA: Diagnosis not present

## 2014-08-17 DIAGNOSIS — O2230 Deep phlebothrombosis in pregnancy, unspecified trimester: Secondary | ICD-10-CM | POA: Diagnosis present

## 2014-08-17 DIAGNOSIS — I4891 Unspecified atrial fibrillation: Secondary | ICD-10-CM | POA: Diagnosis present

## 2014-08-17 DIAGNOSIS — M810 Age-related osteoporosis without current pathological fracture: Secondary | ICD-10-CM | POA: Diagnosis present

## 2014-08-17 DIAGNOSIS — Z853 Personal history of malignant neoplasm of breast: Secondary | ICD-10-CM | POA: Diagnosis not present

## 2014-08-17 DIAGNOSIS — M79609 Pain in unspecified limb: Secondary | ICD-10-CM | POA: Diagnosis present

## 2014-08-17 LAB — HEPARIN LEVEL (UNFRACTIONATED): Heparin Unfractionated: <0.1 IU/mL — ABNORMAL LOW (ref 0.30–0.70)

## 2014-08-17 LAB — CEA
CEA: 1.6 ng/mL (ref 0.0–5.0)
CEA: 1.7 ng/mL (ref 0.0–5.0)

## 2014-08-17 LAB — MRSA PCR SCREENING: MRSA by PCR: NEGATIVE

## 2014-08-17 LAB — CBC
HCT: 35.7 % — ABNORMAL LOW (ref 36.0–46.0)
Hemoglobin: 11.5 g/dL — ABNORMAL LOW (ref 12.0–15.0)
MCH: 31 pg (ref 26.0–34.0)
MCHC: 32.2 g/dL (ref 30.0–36.0)
MCV: 96.2 fL (ref 78.0–100.0)
Platelets: 188 10*3/uL (ref 150–400)
RBC: 3.71 MIL/uL — ABNORMAL LOW (ref 3.87–5.11)
RDW: 13.9 % (ref 11.5–15.5)
WBC: 8.7 10*3/uL (ref 4.0–10.5)

## 2014-08-17 LAB — CANCER ANTIGEN 27.29
CA 27.29: 36 U/mL (ref 0–39)
CA 27.29: 37 U/mL (ref 0–39)

## 2014-08-17 MED ORDER — ENOXAPARIN SODIUM 80 MG/0.8ML ~~LOC~~ SOLN: 70.0000 mg | SUBCUTANEOUS | Status: DC

## 2014-08-17 MED ORDER — HEPARIN (PORCINE) IN NACL 100-0.45 UNIT/ML-% IJ SOLN
850.0000 [IU]/h | INTRAMUSCULAR | Status: DC
  Administered 2014-08-17: 850 [IU]/h via INTRAVENOUS
  Filled 2014-08-17: qty 250

## 2014-08-17 MED ORDER — PSYLLIUM 95 % PO PACK
0.5000 | PACK | Freq: Every day | ORAL | Status: DC
  Administered 2014-08-17 – 2014-08-18 (×2): 0.5 via ORAL
  Filled 2014-08-17 (×2): qty 0.5

## 2014-08-17 MED ORDER — HEPARIN (PORCINE) IN NACL 100-0.45 UNIT/ML-% IJ SOLN
700.0000 [IU]/h | INTRAMUSCULAR | Status: DC
  Administered 2014-08-17: 700 [IU]/h via INTRAVENOUS
  Filled 2014-08-17: qty 250

## 2014-08-17 MED ORDER — PSYLLIUM 0.52 G PO CAPS: 0.5200 g | ORAL_CAPSULE | Freq: Every day | ORAL | Status: DC

## 2014-08-17 MED ORDER — LORAZEPAM 2 MG/ML IJ SOLN
0.5000 mg | Freq: Once | INTRAMUSCULAR | Status: AC
  Administered 2014-08-17: 0.5 mg via INTRAVENOUS
  Filled 2014-08-17: qty 1

## 2014-08-17 MED ORDER — ENOXAPARIN SODIUM 80 MG/0.8ML ~~LOC~~ SOLN
70.0000 mg | SUBCUTANEOUS | Status: DC
  Administered 2014-08-17 – 2014-08-18 (×2): 70 mg via SUBCUTANEOUS
  Filled 2014-08-17 (×2): qty 0.8

## 2014-08-17 NOTE — Progress Notes (Signed)
[place]  Telephone:([phone]) [phone] Fax:(336) [phone]     ID: Tasha Ball DOB: January 19, 1924  MR#: [id number]  MBW#:[id number]  Patient Care Team: Jerlyn Ly, MD as PCP - General (Internal Medicine) PCP: Jerlyn Ly, MD:  HPI:  Tasha Ball is an 80 y/o [facility] resident who c/o pain in her R hand being evaluated in the ED 08/16/2014. According to the son there may have been a fall, but he was not clear or specific about this. The patient herself is pleasantly demented and cannot give a reliable history. She has had R wrist films which showed no acute fracture, however RUE venous doppler showed a DVT in the R brachial vein. The patient is on chronic coumadin because of a history of AFib/ CHF and her INR was 3.0. Hematology was consulted regarding further management of anticoagulation.  REVIEW OF SYSTEMS: At present the patient denies pain. When her Right wrist is brough up she is vague regarding discomfort. She does not recall trauma or a flal. Son at bedside tells me the family is concerned the patient has lost a lot of weight since moving to [facility]. He is not aware of problems with cough, CP/P, or SOB. The patient herself denies these symptoms at present. Her functional status is very limited. Asked what she does all day the patient tells me "she is taking education courses."  PAST MEDICAL HISTORY: Past Medical History  Diagnosis Date  . Urinary tract infection   . Cancer     non-hodgkins lymphoma   . Lymphoma in remission     ca free 5 yrs  . Dementia   . Atrial fibrillation   . Hypertension   . Mitral valve prolapse   . Thyroid disease   . Memory loss   . Osteoporosis   . 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 Hip fracture, left 10/16/2011  . Femoral fracture 10/27/2011  . Pelvic fracture 10/08/2013  . HTN (hypertension) 10/20/2011  . DVT (deep venous thrombosis)     PAST SURGICAL HISTORY: Past Surgical History  Procedure Laterality Date  . Eye  surgery      bil.cataract with lens implants  . Hip arthroplasty  10/18/2011    Procedure: ARTHROPLASTY BIPOLAR HIP;  Surgeon: Mauri Pole;  Location: [facility];  Service: Orthopedics;  Laterality: Left;  depuy    FAMILY HISTORY Family History  Problem Relation Age of Onset  . Heart disease Father   . Angina Father   . Stomach cancer Brother 17  . 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 She is now a resident at [facility] and appears to spend at least 1/2 the day in bed, according to son. She has three children and numerous grandchildren and great grandchildren    ADVANCED DIRECTIVES: in place; the patient's living will according to son [name] (who is her HCPOA) specifies no resuscitation or life support in case of a terminal event   HEALTH MAINTENANCE: History  Substance Use Topics  . Smoking status: Never Smoker   . Smokeless tobacco: Never Used  . Alcohol Use: No    No Known Allergies  Current Facility-Administered Medications  Medication Dose Route Frequency Provider Last Rate Last Dose  . acetaminophen (TYLENOL) tablet 650 mg  650 mg Oral Q6H PRN Modena Jansky, MD       Or  . acetaminophen (TYLENOL) suppository  650 mg  650 mg Rectal Q6H PRN Modena Jansky, MD      . albuterol (PROVENTIL) (2.5 MG/3ML) 0.083% nebulizer solution 2.5 mg  2.5 mg Nebulization Q2H PRN Modena Jansky, MD      . cholecalciferol (VITAMIN D) tablet 1,000 Units  1,000 Units Oral Daily Modena Jansky, MD      . donepezil (ARICEPT) tablet 23 mg  23 mg Oral QHS Modena Jansky, MD   23 mg at 08/16/14 2314  . escitalopram (LEXAPRO) tablet 10 mg  10 mg Oral Daily Modena Jansky, MD      . heparin ADULT infusion 100 units/mL (25000 units/250 mL)  700 Units/hr Intravenous Continuous Modena Jansky, MD 7 mL/hr at 08/17/14 0448 700 Units/hr at 08/17/14 0448  . levothyroxine  (SYNTHROID, LEVOTHROID) tablet 100 mcg  100 mcg Oral QAC breakfast Modena Jansky, MD      . losartan (COZAAR) tablet 50 mg  50 mg Oral Daily Modena Jansky, MD      . Memantine HCl ER CP24 28 mg  28 mg Oral Daily Modena Jansky, MD      . metoprolol (LOPRESSOR) tablet 50 mg  50 mg Oral BID Modena Jansky, MD   50 mg at 08/16/14 2314  . mirtazapine (REMERON) tablet 15 mg  15 mg Oral QHS Modena Jansky, MD   15 mg at 08/16/14 2314  . ondansetron (ZOFRAN) tablet 4 mg  4 mg Oral Q6H PRN Modena Jansky, MD       Or  . ondansetron (ZOFRAN) injection 4 mg  4 mg Intravenous Q6H PRN Modena Jansky, MD      . psyllium (HYDROCIL/METAMUCIL) packet 0.5 packet  0.5 packet Oral Daily Randall K Absher, RPH      . traMADol (ULTRAM) tablet 50 mg  50 mg Oral Q6H PRN Modena Jansky, MD        OBJECTIVE: elderly White woman examined in [place]:   08/16/14 2056  BP: 150/82  Pulse: 77  Temp: 98.3 F (36.8 C)  Resp: 18     Body mass index is 19.2 kg/(m^2).    ECOG FS:3 - Symptomatic, >50% confined to bed  Ocular: EOMs intact; sclerae unicteric Ear-nose-throat: Oropharynx no thrush or other lesions Lymphatic: No cervical or supraclavicular adenopathy Lungs no rales or rhonchi, auscultated anterolaterally Heart regular rate and rhythm, no murmur appreciated Abd soft, nontender, positive bowel sounds MSK slight erythema over R wrist w/o raised border, mild swelling R wrist, good ROM R wrist, no obvious tenderness to manipulation Neuro: non-focal, friendly affect Breasts: deferred   LAB RESULTS:  CMP     Component Value Date/Time   NA 140 08/16/2014 1534   K 3.9 08/16/2014 1534   CL 102 08/16/2014 1534   CO2 26 08/16/2014 1534   GLUCOSE 96 08/16/2014 1534   BUN 15 08/16/2014 1534   CREATININE 0.66 08/16/2014 1534   CALCIUM 9.0 08/16/2014 1534   PROT 5.6* 10/08/2013 0640   ALBUMIN 2.6* 10/08/2013 0640   AST 17 10/08/2013 0640   ALT 10 10/08/2013 0640   ALKPHOS 135* 10/08/2013  0640   BILITOT 1.1 10/08/2013 0640   GFRNONAA 77* 08/16/2014 1534   GFRAA 89* 08/16/2014 1534    I No results found for this basename: SPEP, UPEP,  kappa and lambda light chains    Lab Results  Component Value Date   WBC 8.7 08/17/2014   NEUTROABS 6.5 08/16/2014   HGB  11.5* 08/17/2014   HCT 35.7* 08/17/2014   MCV 96.2 08/17/2014   PLT 188 08/17/2014    @LASTCHEMISTRY @  Lab Results  Component Value Date   LABCA2 36 08/16/2014    No components found with this basename: LABCA125     Recent Labs Lab 08/16/14 1534  INR 3.00*    Urinalysis    Component Value Date/Time   COLORURINE AMBER* 10/07/2013 2334   APPEARANCEUR CLOUDY* 10/07/2013 2334   LABSPEC 1.022 10/07/2013 2334   LABSPEC 1.005 10/08/2006 1225   PHURINE 6.0 10/07/2013 [place] 10/07/2013 2334   HGBUR SMALL* 10/07/2013 2334   BILIRUBINUR SMALL* 10/07/2013 2334   KETONESUR 15* 10/07/2013 2334   PROTEINUR 30* 10/07/2013 2334   UROBILINOGEN 1.0 10/07/2013 2334   NITRITE POSITIVE* 10/07/2013 2334   LEUKOCYTESUR LARGE* 10/07/2013 2334    STUDIES: Dg Wrist Complete Right  08/16/2014   CLINICAL DATA:  Swelling of the right radius.  No trauma.  EXAM: RIGHT WRIST - COMPLETE 3+ VIEW  COMPARISON:  None.  FINDINGS: There is no evidence of fracture or dislocation. There is chondrocalcinosis at the ulna carpal joint. There are degenerative joint changes of the first metacarpal carpal joint. There is no acute fracture or dislocation.  IMPRESSION: No acute fracture or dislocation. Degenerative joint changes as described.   Electronically Signed   By: Abelardo Diesel M.D.   On: 08/16/2014 15:30   Filed: 08/16/2014 4:25 PM Note Time: 08/16/2014 4:24 PM Status: Signed   Editor: Doyne Keel Simonetti (Cardiovascular Sonographer)      *Preliminary Results*  Right upper extremity venous duplex completed.  Right upper extremity is positive for deep vein thrombosis involving a single right brachial vein in its proximal segment.    Preliminary results discussed with Montine Circle, PA-C.  08/16/2014 4:24 PM  Maudry Mayhew, RVT, RDCS, RDMS     Ct Angio Chest Pe W/cm &/or Wo Cm  08/16/2014   CLINICAL DATA:  Right upper extremity deep VT. Currently on heparin. Evaluate pulmonary embolism. History of lymphoma.  EXAM: CT ANGIOGRAPHY CHEST WITH CONTRAST  TECHNIQUE: Multidetector CT imaging of the chest was performed using the standard protocol during bolus administration of intravenous contrast. Multiplanar CT image reconstructions and MIPs were obtained to evaluate the vascular anatomy.  CONTRAST:  36mL OMNIPAQUE IOHEXOL 350 MG/ML SOLN  COMPARISON:  PET-CT 09/20/2008  FINDINGS: There are no filling defects within pulmonary arteries to suggest acute pulmonary embolism. The aorta is not opacified therefore poorly evaluated. No acute findings. There is heavy intimal calcification. Heart is enlarged including the right and left heart.  Review of the lung parenchyma demonstrates interlobular septal thickening. No infarction or infiltrate.  Limited view of the upper abdomen is unremarkable. Limited view of the skeleton demonstrates severe endplate degenerative change with osteophytosis.  Review of the MIP images confirms the above findings.  IMPRESSION: 1. No evidence acute pulmonary embolism. 2. Marked cardiomegaly. 3. Interstitial pulmonary edema consistent with heart failure.   Electronically Signed   By: Suzy Bouchard M.D.   On: 08/16/2014 19:49    ASSESSMENT: 78 y.o. 80 y.o. [place] resident on chonic coumadin due to A Fib, admitted with an acute DVT in the R brachial vein; PT/INR is 3.0   PLAN:  It is unusual to have a breakthrough clot in a patient whose PT/INRs have been consistently in the therapeutic range.  Results for CHANDANI, ROGOWSKI (MRN [id number]) as of 08/16/2014 18:31   Ref. Range  10/09/2013 06:20  10/10/2013  06:10  10/11/2013 05:50  03/10/2014 09:51  08/16/2014 15:34   INR  Latest Range: 0.00-1.49  2.31 (H)   2.48 (H)  2.11 (H)  2.48 (H)  3.00 (H)   The family is concerned that the patient has "lost a lot of weight" in the past year, so it is possible there is an underlying malignancy. More likely in my opining is that this pleasantly demented patient suffered some trauma to the RUE which was not recorded or remembered.   At this point I suggest:  (1) IV heparin per pharmacy  (2) d/c coumadin  (3) CT angio to evaluate for possible PE-- done-- no evidence of PE (4) CEA, CA 27-29 and CA 125, results pending (5) assuming patient is stable next 24-48 hours would suggest d/c back to [facility] on lovenox at 1.5 mg/kg/d sQ indefinitely  (6) alternatives can be considered (rivaroxaban or apixaban) if it proves impossible to obtain the lovenox but I am less comfortable using the newer drugs in the setting of a very elderly case with apparent coumadin failure   Appreciate consulting on Ms. Saran's case. I will be out of town as of 7 AM tomorrow but will be glad to follow-up on Ms Lippe if she is still hospitalized by 08/20/2014 and I will review her tumor marker labs next week after my return. I have discussed the overall assessment and plan with the patient and her son [name], who are in agreement.  Marland Kitchen  Chauncey Cruel, MD 08/16/2014 6:30 PM      PLAN:  The patient has a good understanding of the overall plan. She agrees with it. She knows the goal of treatment in her case is cure. She will call with any problems that may develop before her next visit here.  Chauncey Cruel, MD   08/17/2014 6:52 AM

## 2014-08-17 NOTE — Care Management Note (Unsigned)
    Page 1 of 1   08/17/2014     3:24:56 PM CARE MANAGEMENT NOTE 08/17/2014  Patient:  Tasha Ball, Tasha Ball   Account Number:  [id number]  Date Initiated:  08/17/2014  Documentation initiated by:  [facility]  Subjective/Objective Assessment:   80 year old female admitted with DVT.     Action/Plan:   fROM alf.   Anticipated DC Date:  08/18/2014   Anticipated DC Plan:  ASSISTED LIVING / REST HOME  In-house referral  Clinical Social Worker      DC Planning Services  CM consult      Choice offered to / List presented to:          [facility] arranged  HH-1 RN      [place]   Status of service:  In process, will continue to follow Medicare Important Message given?   (If response is "NO", the following Medicare IM given date fields will be blank) Date Medicare IM given:   Medicare IM given by:   Date Additional Medicare IM given:   Additional Medicare IM given by:    Discharge Disposition:    Per UR Regulation:  Reviewed for med. necessity/level of care/duration of stay  If discussed at [place] of Stay Meetings, dates discussed:    Comments:

## 2014-08-17 NOTE — Progress Notes (Signed)
ANTICOAGULATION CONSULT NOTE - Follow Up Consult  Pharmacy Consult for Lovenox Indication: Acute brachial DVT despite therapeutic INR on warfarin; hx of atrial fibrillation and prior DVT  No Known Allergies  Patient Measurements: Height: 5\' 2"  (157.5 cm) Weight: 105 lb (47.628 kg) IBW/kg (Calculated) : 50.1   Vital Signs: Temp: 98.7 F (37.1 C) (09/18 0700) Temp src: Oral (09/18 0700) BP: 144/77 mmHg (09/18 0700) Pulse Rate: 77 (09/18 0700)  Labs:  Recent Labs  08/16/14 1534 08/17/14 0145  HGB 11.7* 11.5*  HCT 35.8* 35.7*  PLT 190 188  LABPROT 31.1*  --   INR 3.00*  --   HEPARINUNFRC  --  <0.10*  CREATININE 0.66  --     Estimated Creatinine Clearance: 36.5 ml/min (by C-G formula based on Cr of 0.66).   Medications:  Scheduled:  . cholecalciferol  1,000 Units Oral Daily  . donepezil  23 mg Oral QHS  . enoxaparin (LOVENOX) injection  70 mg Subcutaneous Q24H  . escitalopram  10 mg Oral Daily  . levothyroxine  100 mcg Oral QAC breakfast  . losartan  50 mg Oral Daily  . Memantine HCl ER  28 mg Oral Daily  . metoprolol tartrate  50 mg Oral BID  . mirtazapine  15 mg Oral QHS  . psyllium  0.5 packet Oral Daily   Infusions:   PRN: acetaminophen, acetaminophen, albuterol, ondansetron (ZOFRAN) IV, ondansetron, traMADol  Assessment: 80 y/o F on chronic warfarin for history of atrial fibrillation and DVT developed acute brachial DVT despite INR of 3.0.   Patient was admitted and placed on IV heparin.  CT Angio did not show evidence of pulmonary embolus.  Hematology recommendation from 9/17 was for IV heparin until stable, then change to Lovenox 1.5mg /kg SQ q24h indefinitely and not reinitiate warfarin.   NOACs such as rivaroxaban or apixaban were thought to be suboptimal choices given patient's advanced age, but could be considered if obtaining Lovenox proves impossible.  Goal of Therapy:  Full-dose once-daily Lovenox at dosage appropriate for weight and renal  function. Monitor platelets by anticoagulation protocol: Yes   Plan:  Reviewed with attending MD (Dr. Algis Liming) and patient's RN: 1. DC heparin IV now. 2. One hour later, begin Lovenox 70 mg SQ q24h. 3. Follow CBC, renal function, clinical course during remainder of hospitalization.  Clayburn Pert, PharmD, BCPS Pager: [phone] 08/17/2014  9:45 AM

## 2014-08-17 NOTE — Progress Notes (Signed)
D/c pt wrist restraints at 1013.  Put has no skin breakdown and full mobility of her wrists. Will make sure bed alarm is on and bed is in lowest position.  Will monitor closely.  Irven Baltimore, RN

## 2014-08-17 NOTE — Progress Notes (Signed)
Patient confused agitated, attempting to bite staff.  Dislodged iv line.  Ativan 0.5mg  adm, wrist restraints initiated.  Continue to monitior

## 2014-08-17 NOTE — Progress Notes (Signed)
PROGRESS NOTE    Tasha Ball ZOX:[id number] DOB: 15-Mar-1924 DOA: 08/16/2014 PCP: Jerlyn Ly, MD  HPI/Brief narrative 80 y.o. female , resident of [facility] assisted living facility, extensive PMH-non-Hodgkin's lymphoma in remission, advanced dementia, A. fib on chronic Coumadin, HTN, MVP, hypothyroid and DVT, sent from ALF due to right hand pain. In the ED, x-rays of right arrest were negative for fractures. Right upper extremity venous Doppler was positive for DVT involving single right brachial vein in its proximal segment. Hematology consulted and opined Coumadin failure and recommended full dose Lovenox indefinitely.   Assessment/Plan:  1. Right upper extremity DVT/single right brachial vein: In the context of therapeutic INR. Hematology consultation appreciated-opine and agree that this is Coumadin failure, recommend DC Coumadin and treat with therapeutic dose Lovenox indefinitely. CTA chest negative for PE. Although CT chest suggested CHF, patient appears euvolemic. Tumor markers-CA 27.29 & CEA are within normal range. CA 125-pending 2. Right wrist pain: Unclear etiology. Not sure if DVT is contributing to this. History of fall is not clear either. X-rays without acute findings. Seems to be improving. 3. Hypertension: Controlled. Continue ARB and beta blockers. 4. History of A. fib: Seems to be in sinus rhythm clinically. Continue metoprolol. Now on Lovenox anticoagulation. 5. Hypothyroid: Continue Synthroid 6. Anemia: Chronic and stable. 7. Dementia: Apparently had some agitation overnight-c/w sundowning. Not agitated this morning. DC restraints and monitor. 8. History of lymphoma: Said to be in remission.   Code Status:  Full Family Communication:  None at bedside. Left message for son Mr. [name] at listed number. Disposition Plan:  Returned to ALF 9/19   Consultants:   Hematology  Procedures:   None  Antibiotics:   None   Subjective:  Pleasantly confused  and not agitated. Does complain of some pain in the right wrist.  Objective: Filed Vitals:   08/16/14 1935 08/16/14 2056 08/17/14 0700 08/17/14 1400  BP:  150/82 144/77 137/46  Pulse:  77 77 64  Temp: 98.7 F (37.1 C) 98.3 F (36.8 C) 98.7 F (37.1 C)   TempSrc: Oral Oral Oral   Resp:  18 18 20   Height:      Weight:      SpO2:  97% 98% 97%    Intake/Output Summary (Last 24 hours) at 08/17/14 1654 Last data filed at 08/17/14 1653  Gross per 24 hour  Intake    600 ml  Output      1 ml  Net    599 ml   Filed Weights   08/16/14 1751  Weight: 47.628 kg (105 lb)     Exam:  General exam:  Pleasant elderly female lying comfortably in bed. Respiratory system: Clear. No increased work of breathing. Cardiovascular system: S1 & S2 heard, RRR. No JVD, murmurs, gallops, clicks or pedal edema. Gastrointestinal system: Abdomen is nondistended, soft and nontender. Normal bowel sounds heard. Central nervous system: Alert and oriented only to self . No focal neurological deficits. Extremities: Symmetric 5 x 5 power. Right wrist: Seems much improved compared to last night-erythema almost resolved, no significant tenderness or warmth. Seems to be moving wrist without pain or difficulty.   Data Reviewed: Basic Metabolic Panel:  Recent Labs Lab 08/16/14 1534  NA 140  K 3.9  CL 102  CO2 26  GLUCOSE 96  BUN 15  CREATININE 0.66  CALCIUM 9.0   Liver Function Tests: No results found for this basename: AST, ALT, ALKPHOS, BILITOT, PROT, ALBUMIN,  in the last 168 hours No  results found for this basename: LIPASE, AMYLASE,  in the last 168 hours No results found for this basename: AMMONIA,  in the last 168 hours CBC:  Recent Labs Lab 08/16/14 1534 08/17/14 0145  WBC 10.0 8.7  NEUTROABS 6.5  --   HGB 11.7* 11.5*  HCT 35.8* 35.7*  MCV 95.5 96.2  PLT 190 188   Cardiac Enzymes: No results found for this basename: CKTOTAL, CKMB, CKMBINDEX, TROPONINI,  in the last 168 hours BNP  (last 3 results) No results found for this basename: PROBNP,  in the last 8760 hours CBG: No results found for this basename: GLUCAP,  in the last 168 hours  Recent Results (from the past 240 hour(s))  MRSA PCR SCREENING     Status: None   Collection Time    08/16/14 11:16 PM      Result Value Ref Range Status   MRSA by PCR NEGATIVE  NEGATIVE Final   Comment:            The GeneXpert MRSA Assay (FDA     approved for NASAL specimens     only), is one component of a     comprehensive MRSA colonization     surveillance program. It is not     intended to diagnose MRSA     infection nor to guide or     monitor treatment for     MRSA infections.       Studies: Dg Wrist Complete Right  08/16/2014   CLINICAL DATA:  Swelling of the right radius.  No trauma.  EXAM: RIGHT WRIST - COMPLETE 3+ VIEW  COMPARISON:  None.  FINDINGS: There is no evidence of fracture or dislocation. There is chondrocalcinosis at the ulna carpal joint. There are degenerative joint changes of the first metacarpal carpal joint. There is no acute fracture or dislocation.  IMPRESSION: No acute fracture or dislocation. Degenerative joint changes as described.   Electronically Signed   By: Abelardo Diesel M.D.   On: 08/16/2014 15:30   Ct Angio Chest Pe W/cm &/or Wo Cm  08/16/2014   CLINICAL DATA:  Right upper extremity deep VT. Currently on heparin. Evaluate pulmonary embolism. History of lymphoma.  EXAM: CT ANGIOGRAPHY CHEST WITH CONTRAST  TECHNIQUE: Multidetector CT imaging of the chest was performed using the standard protocol during bolus administration of intravenous contrast. Multiplanar CT image reconstructions and MIPs were obtained to evaluate the vascular anatomy.  CONTRAST:  75mL OMNIPAQUE IOHEXOL 350 MG/ML SOLN  COMPARISON:  PET-CT 09/20/2008  FINDINGS: There are no filling defects within pulmonary arteries to suggest acute pulmonary embolism. The aorta is not opacified therefore poorly evaluated. No acute findings.  There is heavy intimal calcification. Heart is enlarged including the right and left heart.  Review of the lung parenchyma demonstrates interlobular septal thickening. No infarction or infiltrate.  Limited view of the upper abdomen is unremarkable. Limited view of the skeleton demonstrates severe endplate degenerative change with osteophytosis.  Review of the MIP images confirms the above findings.  IMPRESSION: 1. No evidence acute pulmonary embolism. 2. Marked cardiomegaly. 3. Interstitial pulmonary edema consistent with heart failure.   Electronically Signed   By: Suzy Bouchard M.D.   On: 08/16/2014 19:49        Scheduled Meds: . cholecalciferol  1,000 Units Oral Daily  . donepezil  23 mg Oral QHS  . enoxaparin (LOVENOX) injection  70 mg Subcutaneous Q24H  . escitalopram  10 mg Oral Daily  . levothyroxine  100 mcg Oral  QAC breakfast  . losartan  50 mg Oral Daily  . Memantine HCl ER  28 mg Oral Daily  . metoprolol tartrate  50 mg Oral BID  . mirtazapine  15 mg Oral QHS  . psyllium  0.5 packet Oral Daily   Continuous Infusions:   Principal Problem:   DVT of upper extremity (deep vein thrombosis) Active Problems:   Atrial fibrillation   HTN (hypertension)   Hypothyroidism   Long term (current) use of anticoagulants   Dementia without behavioral disturbance   DVT (deep vein thrombosis) in pregnancy    Time spent: 25 minutes.    Vernell Leep, MD, FACP, FHM. [facility] Pager [phone]  If 7PM-7AM, please contact night-coverage [email or web address] Password [email or web address] 08/17/2014, 4:54 PM    LOS: 1 day

## 2014-08-17 NOTE — Progress Notes (Signed)
Clinical Social Work Department BRIEF PSYCHOSOCIAL ASSESSMENT 08/17/2014  Patient:  Tasha Ball, Tasha Ball     Account Number:  [id number]     Admit date:  08/16/2014  Clinical Social Worker:  Earlie Server  Date/Time:  08/17/2014 02:20 PM  Referred by:  Physician  Date Referred:  08/17/2014 Referred for  ALF Placement   Other Referral:   Interview type:  Family Other interview type:    PSYCHOSOCIAL DATA Living Status:  FACILITY Admitted from facility:  [facility] Level of care:  Assisted Living Primary support name:  [name] Primary support relationship to patient:  CHILD, ADULT Degree of support available:   Strong    CURRENT CONCERNS Current Concerns  Post-Acute Placement   Other Concerns:    SOCIAL WORK ASSESSMENT / PLAN CSW received referral in order to assist with DC planning. Per chart review, patient is from an ALF. CSW reviewed chart and went to room. Patient confused and unable to fully participate in assessment. CSW spoke with son via phone.    Patient's son reports that patient has been at [facility] for about 1 year. Son reports that patient is comfortable at facility and he wants her to return to ALF. Son feels that ALF is providing adequate care for patient and does not want SNF at this time.    CSW spoke with [name] at [facility] who confirms patient is a resident and they can accept patient over the weekend if stable. ALF requests that CSW fax DC summary and FL2 to [phone] at DC. ALF is requesting hard script for Lovanox today so they can get prescription filled for weekend DC. ALF is aware of patient's needs and daily Lovanox needs and feel they can manage patient's care. ALF is requesting HH via [facility] to assist with needs.    CSW completed FL2 and will leave handoff for weekend CSW.   Assessment/plan status:  Psychosocial Support/Ongoing Assessment of Needs Other assessment/ plan:   Information/referral to community resources:   Will return  to ALF    PATIENT'S/FAMILY'S RESPONSE TO PLAN OF CARE: Patient unable to participate to in assessment. Son engaged and thanked CSW for call. Son reports he is comfortable with patient DC over the weekend but he is working. Son requests he be called at [phone] at DC. Son reports that [facility] can transport patient back to ALF. Son thanked CSW for call and reports he will be happy when patient is settled back into her familiar environment.      [place], [place] [phone]

## 2014-08-17 NOTE — Progress Notes (Signed)
PT Cancellation Note  Patient Details Name: Tasha Ball MRN: [id number] DOB: [birth year]   Cancelled Treatment:    Reason Eval/Treat Not Completed: Other (comment) (Per RN pt is now sleeping after being up and agitated much of the night. RN requested PT attempt later. )   Philomena Doheny 08/17/2014, 10:29 AM [phone]

## 2014-08-17 NOTE — Progress Notes (Signed)
Clinical Social Work   CSW received a call from [place] at [facility] who requested hard script of Lovanox be faxed to facility and that RN arrange [facility] services. CSW faxed script and CM aware of [place] needs. Weekend CSW to assist with weekend DC.  [place],  [phone]

## 2014-08-17 NOTE — Progress Notes (Signed)
ANTICOAGULATION CONSULT NOTE - Follow Up Consult  Pharmacy Consult for Heparin Indication: DVT  No Known Allergies  Patient Measurements: Height: 5\' 2"  (157.5 cm) Weight: 105 lb (47.628 kg) IBW/kg (Calculated) : 50.1 Heparin Dosing Weight:   Vital Signs: Temp: 98.3 F (36.8 C) (09/17 2056) Temp src: Oral (09/17 2056) BP: 150/82 mmHg (09/17 2056) Pulse Rate: 77 (09/17 2056)  Labs:  Recent Labs  08/16/14 1534 08/17/14 0145  HGB 11.7* 11.5*  HCT 35.8* 35.7*  PLT 190 188  LABPROT 31.1*  --   INR 3.00*  --   HEPARINUNFRC  --  <0.10*  CREATININE 0.66  --     Estimated Creatinine Clearance: 36.5 ml/min (by C-G formula based on Cr of 0.66).   Medications:  Infusions:  . heparin 700 Units/hr (08/17/14 0448)    Assessment: Patient with low heparin level.  RN updated with information after rate change that IV had been dislodged prior to level.  Unclear how long, with this information will consider 0200 level not correct due to loss drug.  Goal of Therapy:  Heparin level 0.3-0.7 units/ml Monitor platelets by anticoagulation protocol: Yes   Plan:  Return heparin to 700 units/hr after brief period at 850 units/hr,  Check level at 1300.  Tyler Deis, Shea Stakes [place]ford 08/17/2014,5:15 AM

## 2014-08-17 NOTE — Evaluation (Signed)
Physical Therapy Evaluation Patient Details Name: Tasha Ball MRN: [id number] DOB: 07/08/1924 Today's Date: 08/17/2014   History of Present Illness  80 y.o. female with h/o dementia admitted with RUE DVT.   Clinical Impression  *Pt admitted with RUE DVT*. Pt currently with functional limitations due to the deficits listed below (see PT Problem List).  Pt will benefit from skilled PT to increase their independence and safety with mobility to allow discharge to the venue listed below.   Pt pivoted to recliner with min assist. Per son pt was not walking at ALF, she was using a WC.   **    Follow Up Recommendations SNF;Supervision/Assistance - 24 hour    Equipment Recommendations  None recommended by PT    Recommendations for Other Services       Precautions / Restrictions Precautions Precautions: Fall Restrictions Weight Bearing Restrictions: No      Mobility  Bed Mobility Overal bed mobility: Needs Assistance Bed Mobility: Supine to Sit     Supine to sit: Mod assist     General bed mobility comments: assist to raise trunk and pivot to EOB  Transfers Overall transfer level: Needs assistance Equipment used: None Transfers: Sit to/from [facility] Sit to Stand: Mod assist Stand pivot transfers: Min assist       General transfer comment: mod A to rise, min A to pivot, pt held onto armrests of recliner with BUEs  Ambulation/Gait             General Gait Details: NT-pt non-ambulatory PTA  Stairs            Wheelchair Mobility    Modified Rankin (Stroke Patients Only)       Balance Overall balance assessment: Needs assistance   Sitting balance-Leahy Scale: Good       Standing balance-Leahy Scale: Poor Standing balance comment: pt stood holding onto RW for 2 minutes for pericare                             Pertinent Vitals/Pain Pain Assessment: No/denies pain    Home Living Family/patient expects to be  discharged to:: Assisted living (memory care unit)               Home Equipment: Wheelchair - manual      Prior Function Level of Independence: Needs assistance   Gait / Transfers Assistance Needed: per son pt has not been walking at memory care unit, she's primary using a WC  ADL's / Homemaking Assistance Needed: assist for bathing/dressing        Hand Dominance        Extremity/Trunk Assessment   Upper Extremity Assessment: RUE deficits/detail RUE Deficits / Details: edema noted in R hand, pt guards R hand and tends to use it less than L functionally         Lower Extremity Assessment: Generalized weakness      Cervical / Trunk Assessment: Kyphotic  Communication   Communication: HOH  Cognition Arousal/Alertness: Awake/alert Behavior During Therapy: WFL for tasks assessed/performed Overall Cognitive Status: History of cognitive impairments - at baseline       Memory: Decreased short-term memory              General Comments      Exercises        Assessment/Plan    PT Assessment Patient needs continued PT services  PT Diagnosis Generalized weakness   PT Problem List Decreased  balance;Decreased mobility;Decreased activity tolerance  PT Treatment Interventions Functional mobility training;Therapeutic activities;Patient/family education;Balance training   PT Goals (Current goals can be found in the Care Plan section) Acute Rehab PT Goals PT Goal Formulation: Patient unable to participate in goal setting Time For Goal Achievement: 08/31/14 Potential to Achieve Goals: Fair    Frequency Min 3X/week   Barriers to discharge        Co-evaluation               End of Session Equipment Utilized During Treatment: Gait belt Activity Tolerance: Patient tolerated treatment well Patient left: in chair;with call bell/phone within reach;with chair alarm set Nurse Communication: Mobility status         Time: 4536-4680 PT Time Calculation  (min): 17 min   Charges:   PT Evaluation $Initial PT Evaluation Tier I: 1 Procedure PT Treatments $Therapeutic Activity: 8-22 mins   PT G Codes:          Philomena Doheny 08/17/2014, 2:01 PM [phone]

## 2014-08-17 NOTE — Progress Notes (Signed)
Utilization Review Completed.Tasha Ball T9/18/2015  

## 2014-08-18 DIAGNOSIS — Z87898 Personal history of other specified conditions: Secondary | ICD-10-CM

## 2014-08-18 NOTE — Discharge Summary (Signed)
Physician Discharge Summary  Tasha Ball:[id number] DOB: [birth year] DOA: 08/16/2014  PCP: Jerlyn Ly, MD  Admit date: 08/16/2014 Discharge date: 08/18/2014  Time spent: Less than 30 minutes  Recommendations for Outpatient Follow-up:  1. Dr. Crist Infante, PCP in 1 week with repeat labs (CBC). 2. Dr. Lurline Del, Oncology will follow up on outstanding lab results that were sent from the hospital.  Discharge Diagnoses:  Principal Problem:   DVT of upper extremity (deep vein thrombosis) Active Problems:   Atrial fibrillation   HTN (hypertension)   Hypothyroidism   Long term (current) use of anticoagulants   Dementia without behavioral disturbance   DVT (deep vein thrombosis) in pregnancy   Discharge Condition: Improved & Stable  Diet recommendation: Heart Healthy diet.  Filed Weights   08/16/14 1751  Weight: 47.628 kg (105 lb)    History of present illness:  80 y.o. female , resident of [facility] assisted living facility, extensive PMH-non-Hodgkin's lymphoma in remission, advanced dementia, A. fib on chronic Coumadin, HTN, MVP, hypothyroid and DVT, sent from ALF due to right hand pain. In the ED, x-rays of right arrest were negative for fractures. Right upper extremity venous Doppler was positive for DVT involving single right brachial vein in its proximal segment. Hematology consulted and opined Coumadin failure and recommended full dose Lovenox indefinitely.  Hospital Course:   1. Right upper extremity DVT/single right brachial vein: In the context of therapeutic INR. Hematology consultation appreciated-opine and agree that this is Coumadin failure, recommend DC Coumadin and treat with therapeutic dose Lovenox indefinitely. Although NOAC could be considered, we felt less comfortable using the newer drugs in the setting of a very elderly frail patient. There is no reported history of frequent falls in which case the whole decision regarding continued anticoagulation  will have to be revisited. CTA chest negative for PE. Although CT chest suggested CHF, patient appears euvolemic. Tumor markers-CA 27.29 & CEA are within normal range. CA 125-pending and will be followed by the oncologist. 2. Right wrist pain: Unclear etiology. Not sure if DVT is contributing to this. History of fall is not clear either. X-rays without acute findings. Seems to be improving. 3. Hypertension: Reasonably controlled. Continue ARB and beta blockers. 4. History of A. fib: Seems to be in sinus rhythm clinically. Continue metoprolol. Now on Lovenox anticoagulation. 5. Hypothyroid: Continue Synthroid 6. Anemia: Chronic and stable. 7. Dementia: Patient had some agitation on the night of admission but none over the last 24 hours and has been off restraints. 8. History of lymphoma: Followup by her primary oncologist today appreciated. He states that her lymphoma is cured.   Consultations:  Hematology/oncology  Procedures:  Right upper extremity venous duplex completed.  Right upper extremity is positive for deep vein thrombosis involving a single right brachial vein in its proximal segment.     Discharge Exam:  Complaints: Pleasantly confused. Denies complaints. Denies pain.  Filed Vitals:   08/17/14 0700 08/17/14 1400 08/17/14 2121 08/18/14 0550  BP: 144/77 137/46 115/79 161/100  Pulse: 77 64 62 62  Temp: 98.7 F (37.1 C)  97.9 F (36.6 C) 97.3 F (36.3 C)  TempSrc: Oral  Oral Oral  Resp: 18 20 20 20   Height:      Weight:      SpO2: 98% 97% 97% 98%   General exam: Pleasant elderly female lying comfortably in bed.  Respiratory system: Clear. No increased work of breathing.  Cardiovascular system: S1 & S2 heard, RRR. No JVD, murmurs, gallops, clicks  or pedal edema.  Gastrointestinal system: Abdomen is nondistended, soft and nontender. Normal bowel sounds heard.  Central nervous system: Alert and oriented only to self . No focal neurological deficits.  Extremities:  Symmetric 5 x 5 power. Right wrist: Acute findings have resolved.   Discharge Instructions      Discharge Instructions   Call MD for:  severe uncontrolled pain    Complete by:  As directed      Diet - low sodium heart healthy    Complete by:  As directed      Increase activity slowly    Complete by:  As directed             Medication List    STOP taking these medications       warfarin 3 MG tablet  Commonly known as:  COUMADIN      TAKE these medications       ARICEPT 23 MG Tabs tablet  Generic drug:  donepezil  Take 23 mg by mouth at bedtime.     b complex vitamins capsule  Take 1 capsule by mouth daily.     dimethicone-zinc oxide cream  Apply 1 application topically 3 (three) times daily as needed for dry skin (redeness or open areas).     enoxaparin 80 MG/0.8ML injection  Commonly known as:  LOVENOX  Inject 0.7 mLs (70 mg total) into the skin daily.     escitalopram 10 MG tablet  Commonly known as:  LEXAPRO  Take 10 mg by mouth daily.     levothyroxine 100 MCG tablet  Commonly known as:  SYNTHROID, LEVOTHROID  Take 1 tablet (100 mcg total) by mouth daily.     losartan 50 MG tablet  Commonly known as:  COZAAR  Take 50 mg by mouth daily.     metoprolol tartrate 25 MG tablet  Commonly known as:  LOPRESSOR  Take 50 mg by mouth 2 (two) times daily.     mirtazapine 15 MG tablet  Commonly known as:  REMERON  Take 15 mg by mouth at bedtime.     NAMENDA XR 28 MG Cp24  Generic drug:  Memantine HCl ER  Take 28 mg by mouth daily.     NUTRITIONAL SUPPLEMENT PO  Take 1 Bottle by mouth.     psyllium 0.52 G capsule  Commonly known as:  REGULOID  Take 0.52 g by mouth daily.     traMADol 50 MG tablet  Commonly known as:  ULTRAM  50 mg every 6 (six) hours as needed (pain in right hand). Take one tablet by mouth every 6 hours as needed for pain     Vitamin D 1000 UNITS capsule  Take 1 capsule (1,000 Units total) by mouth daily.       Follow-up  Information   Follow up with PERINI,MARK A, MD. Schedule an appointment as soon as possible for a visit in 1 week. (To be seen with repeat labs (CBC).)    Specialty:  Internal Medicine   Contact information:   [place] [address] [phone]        The results of significant diagnostics from this hospitalization (including imaging, microbiology, ancillary and laboratory) are listed below for reference.    Significant Diagnostic Studies: Dg Wrist Complete Right  08/16/2014   CLINICAL DATA:  Swelling of the right radius.  No trauma.  EXAM: RIGHT WRIST - COMPLETE 3+ VIEW  COMPARISON:  None.  FINDINGS: There is no evidence of fracture or  dislocation. There is chondrocalcinosis at the ulna carpal joint. There are degenerative joint changes of the first metacarpal carpal joint. There is no acute fracture or dislocation.  IMPRESSION: No acute fracture or dislocation. Degenerative joint changes as described.   Electronically Signed   By: Abelardo Diesel M.D.   On: 08/16/2014 15:30   Ct Angio Chest Pe W/cm &/or Wo Cm  08/16/2014   CLINICAL DATA:  Right upper extremity deep VT. Currently on heparin. Evaluate pulmonary embolism. History of lymphoma.  EXAM: CT ANGIOGRAPHY CHEST WITH CONTRAST  TECHNIQUE: Multidetector CT imaging of the chest was performed using the standard protocol during bolus administration of intravenous contrast. Multiplanar CT image reconstructions and MIPs were obtained to evaluate the vascular anatomy.  CONTRAST:  24mL OMNIPAQUE IOHEXOL 350 MG/ML SOLN  COMPARISON:  PET-CT 09/20/2008  FINDINGS: There are no filling defects within pulmonary arteries to suggest acute pulmonary embolism. The aorta is not opacified therefore poorly evaluated. No acute findings. There is heavy intimal calcification. Heart is enlarged including the right and left heart.  Review of the lung parenchyma demonstrates interlobular septal thickening. No infarction or infiltrate.  Limited view of  the upper abdomen is unremarkable. Limited view of the skeleton demonstrates severe endplate degenerative change with osteophytosis.  Review of the MIP images confirms the above findings.  IMPRESSION: 1. No evidence acute pulmonary embolism. 2. Marked cardiomegaly. 3. Interstitial pulmonary edema consistent with heart failure.   Electronically Signed   By: Suzy Bouchard M.D.   On: 08/16/2014 19:49    Microbiology: Recent Results (from the past 240 hour(s))  MRSA PCR SCREENING     Status: None   Collection Time    08/16/14 11:16 PM      Result Value Ref Range Status   MRSA by PCR NEGATIVE  NEGATIVE Final   Comment:            The GeneXpert MRSA Assay (FDA     approved for NASAL specimens     only), is one component of a     comprehensive MRSA colonization     surveillance program. It is not     intended to diagnose MRSA     infection nor to guide or     monitor treatment for     MRSA infections.     Labs: Basic Metabolic Panel:  Recent Labs Lab 08/16/14 1534  NA 140  K 3.9  CL 102  CO2 26  GLUCOSE 96  BUN 15  CREATININE 0.66  CALCIUM 9.0   Liver Function Tests: No results found for this basename: AST, ALT, ALKPHOS, BILITOT, PROT, ALBUMIN,  in the last 168 hours No results found for this basename: LIPASE, AMYLASE,  in the last 168 hours No results found for this basename: AMMONIA,  in the last 168 hours CBC:  Recent Labs Lab 08/16/14 1534 08/17/14 0145  WBC 10.0 8.7  NEUTROABS 6.5  --   HGB 11.7* 11.5*  HCT 35.8* 35.7*  MCV 95.5 96.2  PLT 190 188   Cardiac Enzymes: No results found for this basename: CKTOTAL, CKMB, CKMBINDEX, TROPONINI,  in the last 168 hours BNP: BNP (last 3 results) No results found for this basename: PROBNP,  in the last 8760 hours CBG: No results found for this basename: GLUCAP,  in the last 168 hours   Signed:  Vernell Leep, MD, FACP, FHM. [facility] Pager [phone]  If 7PM-7AM, please contact  night-coverage [email or web address] Password [facility] 08/18/2014, 12:35 PM

## 2014-08-18 NOTE — Clinical Social Work Note (Signed)
Patient for d/c today back to [facility]. Spoke with Desiree at ALF as well as patient and her son- will plan transfer back via EMS. Eduard Clos, MSW, [place] weekend coverage

## 2014-08-18 NOTE — Progress Notes (Signed)
Mrs. Tasha Ball is well-known to me. I treated her for her lymphoma. Briefly, this is cured. She is now a probably a or 9 years from the [facility].  She's had a lot of other medical problems. Dementia is her biggest issue. She is at an assisted living facility. She's been on long-term Coumadin because of atrial for ablation. There she came in because of right hand pain she was found to have a small thrombus in the right arm. This was in a branch of the brachial vein.  She now is on Lovenox.  Again, her dementia is really her biggest problem. She really did not know who I was. She did not know that she was in the hospital. She had no idea about a blood clot.  Would have to be cautious with anticoagulation with her. She does have a risk of falling. Since she did well on Coumadin without problems with bleeding, I think Lovenox would be reasonable.  She is very pleasant. Her labs yesterday looked okay. She does not have renal insufficiency. Her hemoglobin was 11.5.  Today, her pulse I looked okay. Her blood pressure is 161/100. Pulse is 62. Temperature 97.3. Her lungs are clear. Cardiac exam regular rate and rhythm consistent with atrial fibrillation. Abdomen is soft. On her extremities, I really did not note much in way of swelling in the right arm. There is no pain to palpation over the right arm.  Looks like she might be going home soon. There really is not much else for [place] to do right now. She needs to be on long-term Lovenox.  I am very grateful of the great care that she has been receiving up on 5 E!!!  Pete E.  1 Peter 4:16

## 2014-08-18 NOTE — Progress Notes (Signed)
OT Cancellation Note  Patient Details Name: Tasha Ball MRN: [id number] DOB: May 17, 1924   Cancelled Treatment:    Reason Eval/Treat Not Completed: Other (comment). Pt being D/C'd back to [facility] today, defer OT eval back to that facility as they deem appropriate. Acute OT will sign of.  Almon Register [phone] 08/18/2014, 2:11 PM

## 2014-08-19 NOTE — Progress Notes (Signed)
CARE MANAGEMENT NOTE 08/19/2014  Patient:  Tasha Ball, Tasha Ball   Account Number:  [id number]  Date Initiated:  08/17/2014  Documentation initiated by:  [facility]  Subjective/Objective Assessment:   80 year old female admitted with DVT.     Action/Plan:   from   Anticipated DC Date:  08/18/2014   Anticipated DC Plan:  ASSISTED LIVING / REST HOME  In-house referral  Clinical Social Worker      DC Planning Services  CM consult      [facility] Choice  Resumption Of Svcs/PTA Provider   Choice offered to / List presented to:          [facility] arranged  HH-1 RN      [place]   Status of service:  Completed, signed off Medicare Important Message given?  NA - LOS <3 / Initial given by admissions (If response is "NO", the following Medicare IM given date fields will be blank) Date Medicare IM given:   Medicare IM given by:   Date Additional Medicare IM given:   Additional Medicare IM given by:    Discharge Disposition:  ASSISTED LIVING  Per UR Regulation:  Reviewed for med. necessity/level of care/duration of stay  If discussed at [place] of Stay Meetings, dates discussed:    Comments:  08/19/2014 1630 [facility] aware of dc on 08/18/2014 to ALF. Pt active prior to admission. Jonnie Finner RN CCM Case Mgmt phone [phone]

## 2014-08-27 LAB — CA 125
CA 125: 7 U/mL (ref 0.0–30.2)
CA 125: 7 U/mL (ref 0.0–30.2)

## 2014-09-13 ENCOUNTER — Encounter: Payer: Self-pay | Admitting: Internal Medicine

## 2014-10-15 ENCOUNTER — Emergency Department (HOSPITAL_COMMUNITY): Payer: Medicare Other

## 2014-10-15 ENCOUNTER — Telehealth (HOSPITAL_COMMUNITY): Payer: Self-pay

## 2014-10-15 ENCOUNTER — Inpatient Hospital Stay (HOSPITAL_COMMUNITY)
Admission: EM | Admit: 2014-10-15 | Discharge: 2014-10-18 | DRG: 469 | Disposition: A | Discharge status: Skilled Nursing Facility | Payer: Medicare Other | Attending: Internal Medicine | Admitting: Internal Medicine

## 2014-10-15 ENCOUNTER — Encounter (HOSPITAL_COMMUNITY): Payer: Self-pay | Admitting: Family Medicine

## 2014-10-15 DIAGNOSIS — I482 Chronic atrial fibrillation: Secondary | ICD-10-CM | POA: Diagnosis present

## 2014-10-15 DIAGNOSIS — Z96642 Presence of left artificial hip joint: Secondary | ICD-10-CM | POA: Diagnosis not present

## 2014-10-15 DIAGNOSIS — X58XXXA Exposure to other specified factors, initial encounter: Secondary | ICD-10-CM | POA: Diagnosis present

## 2014-10-15 DIAGNOSIS — S72001A Fracture of unspecified part of neck of right femur, initial encounter for closed fracture: Principal | ICD-10-CM | POA: Diagnosis present

## 2014-10-15 DIAGNOSIS — M810 Age-related osteoporosis without current pathological fracture: Secondary | ICD-10-CM | POA: Diagnosis present

## 2014-10-15 DIAGNOSIS — I959 Hypotension, unspecified: Secondary | ICD-10-CM | POA: Diagnosis present

## 2014-10-15 DIAGNOSIS — Z961 Presence of intraocular lens: Secondary | ICD-10-CM | POA: Diagnosis present

## 2014-10-15 DIAGNOSIS — I10 Essential (primary) hypertension: Secondary | ICD-10-CM | POA: Diagnosis present

## 2014-10-15 DIAGNOSIS — D62 Acute posthemorrhagic anemia: Secondary | ICD-10-CM | POA: Diagnosis not present

## 2014-10-15 DIAGNOSIS — M25559 Pain in unspecified hip: Secondary | ICD-10-CM

## 2014-10-15 DIAGNOSIS — I509 Heart failure, unspecified: Secondary | ICD-10-CM | POA: Insufficient documentation

## 2014-10-15 DIAGNOSIS — Z87440 Personal history of urinary (tract) infections: Secondary | ICD-10-CM | POA: Diagnosis not present

## 2014-10-15 DIAGNOSIS — F0390 Unspecified dementia without behavioral disturbance: Secondary | ICD-10-CM | POA: Diagnosis present

## 2014-10-15 DIAGNOSIS — Z79899 Other long term (current) drug therapy: Secondary | ICD-10-CM | POA: Diagnosis not present

## 2014-10-15 DIAGNOSIS — W19XXXA Unspecified fall, initial encounter: Secondary | ICD-10-CM

## 2014-10-15 DIAGNOSIS — E039 Hypothyroidism, unspecified: Secondary | ICD-10-CM | POA: Diagnosis not present

## 2014-10-15 DIAGNOSIS — S72009A Fracture of unspecified part of neck of unspecified femur, initial encounter for closed fracture: Secondary | ICD-10-CM | POA: Diagnosis present

## 2014-10-15 DIAGNOSIS — Z7901 Long term (current) use of anticoagulants: Secondary | ICD-10-CM

## 2014-10-15 DIAGNOSIS — E876 Hypokalemia: Secondary | ICD-10-CM | POA: Diagnosis not present

## 2014-10-15 DIAGNOSIS — Z681 Body mass index (BMI) 19 or less, adult: Secondary | ICD-10-CM

## 2014-10-15 DIAGNOSIS — I82629 Acute embolism and thrombosis of deep veins of unspecified upper extremity: Secondary | ICD-10-CM | POA: Diagnosis not present

## 2014-10-15 DIAGNOSIS — E43 Unspecified severe protein-calorie malnutrition: Secondary | ICD-10-CM | POA: Diagnosis present

## 2014-10-15 DIAGNOSIS — Z66 Do not resuscitate: Secondary | ICD-10-CM | POA: Diagnosis not present

## 2014-10-15 DIAGNOSIS — I5030 Unspecified diastolic (congestive) heart failure: Secondary | ICD-10-CM

## 2014-10-15 DIAGNOSIS — I4891 Unspecified atrial fibrillation: Secondary | ICD-10-CM | POA: Diagnosis present

## 2014-10-15 DIAGNOSIS — M25551 Pain in right hip: Secondary | ICD-10-CM | POA: Diagnosis present

## 2014-10-15 DIAGNOSIS — Z8572 Personal history of non-Hodgkin lymphomas: Secondary | ICD-10-CM | POA: Diagnosis not present

## 2014-10-15 DIAGNOSIS — N289 Disorder of kidney and ureter, unspecified: Secondary | ICD-10-CM | POA: Diagnosis present

## 2014-10-15 DIAGNOSIS — Z22322 Carrier or suspected carrier of Methicillin resistant Staphylococcus aureus: Secondary | ICD-10-CM

## 2014-10-15 DIAGNOSIS — R52 Pain, unspecified: Secondary | ICD-10-CM

## 2014-10-15 LAB — URINE MICROSCOPIC-ADD ON

## 2014-10-15 LAB — PRO B NATRIURETIC PEPTIDE: PRO B NATRI PEPTIDE: 7993 pg/mL — AB (ref 0–450)

## 2014-10-15 LAB — TYPE AND SCREEN
ABO/RH(D): A POS
ANTIBODY SCREEN: NEGATIVE

## 2014-10-15 LAB — CBC WITH DIFFERENTIAL/PLATELET
BASOS PCT: 0 % (ref 0–1)
Basophils Absolute: 0 10*3/uL (ref 0.0–0.1)
EOS ABS: 0 10*3/uL (ref 0.0–0.7)
EOS PCT: 0 % (ref 0–5)
HEMATOCRIT: 35.8 % — AB (ref 36.0–46.0)
Hemoglobin: 12.1 g/dL (ref 12.0–15.0)
LYMPHS ABS: 1.3 10*3/uL (ref 0.7–4.0)
Lymphocytes Relative: 13 % (ref 12–46)
MCH: 31.3 pg (ref 26.0–34.0)
MCHC: 33.8 g/dL (ref 30.0–36.0)
MCV: 92.7 fL (ref 78.0–100.0)
MONO ABS: 1.3 10*3/uL — AB (ref 0.1–1.0)
Monocytes Relative: 13 % — ABNORMAL HIGH (ref 3–12)
Neutro Abs: 7.3 10*3/uL (ref 1.7–7.7)
Neutrophils Relative %: 74 % (ref 43–77)
Platelets: 214 10*3/uL (ref 150–400)
RBC: 3.86 MIL/uL — AB (ref 3.87–5.11)
RDW: 13.7 % (ref 11.5–15.5)
WBC: 10 10*3/uL (ref 4.0–10.5)

## 2014-10-15 LAB — TROPONIN I

## 2014-10-15 LAB — BASIC METABOLIC PANEL
Anion gap: 12 (ref 5–15)
BUN: 11 mg/dL (ref 6–23)
CHLORIDE: 94 meq/L — AB (ref 96–112)
CO2: 28 meq/L (ref 19–32)
CREATININE: 0.65 mg/dL (ref 0.50–1.10)
Calcium: 9.4 mg/dL (ref 8.4–10.5)
GFR calc non Af Amer: 77 mL/min — ABNORMAL LOW (ref >90)
GFR, EST AFRICAN AMERICAN: 89 mL/min — AB (ref >90)
GLUCOSE: 95 mg/dL (ref 70–99)
Potassium: 3.9 mEq/L (ref 3.7–5.3)
Sodium: 134 mEq/L — ABNORMAL LOW (ref 137–147)

## 2014-10-15 LAB — PROTIME-INR
INR: 2.28 — ABNORMAL HIGH (ref 0.00–1.49)
Prothrombin Time: 25.3 seconds — ABNORMAL HIGH (ref 11.6–15.2)

## 2014-10-15 LAB — URINALYSIS, ROUTINE W REFLEX MICROSCOPIC
GLUCOSE, UA: NEGATIVE mg/dL
Ketones, ur: 15 mg/dL — AB
Leukocytes, UA: NEGATIVE
Nitrite: NEGATIVE
Protein, ur: NEGATIVE mg/dL
SPECIFIC GRAVITY, URINE: 1.019 (ref 1.005–1.030)
UROBILINOGEN UA: 2 mg/dL — AB (ref 0.0–1.0)
pH: 6 (ref 5.0–8.0)

## 2014-10-15 MED ORDER — METOPROLOL TARTRATE 50 MG PO TABS
50.0000 mg | ORAL_TABLET | Freq: Two times a day (BID) | ORAL | Status: DC
  Administered 2014-10-15 – 2014-10-17 (×5): 50 mg via ORAL
  Filled 2014-10-15 (×6): qty 1

## 2014-10-15 MED ORDER — MORPHINE SULFATE 2 MG/ML IJ SOLN
1.0000 mg | INTRAMUSCULAR | Status: DC | PRN
  Administered 2014-10-15: 2 mg via INTRAVENOUS
  Administered 2014-10-15: 1.5 mg via INTRAVENOUS
  Administered 2014-10-16 (×2): 2 mg via INTRAVENOUS
  Filled 2014-10-15 (×4): qty 1

## 2014-10-15 MED ORDER — MORPHINE SULFATE 2 MG/ML IJ SOLN
0.5000 mg | INTRAMUSCULAR | Status: DC | PRN
  Administered 2014-10-15: 0.5 mg via INTRAVENOUS
  Filled 2014-10-15: qty 1

## 2014-10-15 MED ORDER — INFLUENZA VAC SPLIT QUAD 0.5 ML IM SUSY
0.5000 mL | PREFILLED_SYRINGE | INTRAMUSCULAR | Status: AC
  Administered 2014-10-16: 0.5 mL via INTRAMUSCULAR
  Filled 2014-10-15 (×2): qty 0.5

## 2014-10-15 MED ORDER — DONEPEZIL HCL 23 MG PO TABS
23.0000 mg | ORAL_TABLET | Freq: Every day | ORAL | Status: DC
  Administered 2014-10-15 – 2014-10-17 (×3): 23 mg via ORAL
  Filled 2014-10-15 (×5): qty 1

## 2014-10-15 MED ORDER — FENTANYL CITRATE 0.05 MG/ML IJ SOLN
50.0000 ug | INTRAMUSCULAR | Status: DC | PRN
  Administered 2014-10-15: 50 ug via INTRAVENOUS
  Filled 2014-10-15: qty 2

## 2014-10-15 MED ORDER — LOSARTAN POTASSIUM 50 MG PO TABS
50.0000 mg | ORAL_TABLET | Freq: Every day | ORAL | Status: DC
  Administered 2014-10-15 – 2014-10-17 (×3): 50 mg via ORAL
  Filled 2014-10-15 (×4): qty 1

## 2014-10-15 MED ORDER — FUROSEMIDE 10 MG/ML IJ SOLN
40.0000 mg | Freq: Once | INTRAMUSCULAR | Status: AC
  Administered 2014-10-15: 40 mg via INTRAVENOUS
  Filled 2014-10-15: qty 4

## 2014-10-15 MED ORDER — ESCITALOPRAM OXALATE 10 MG PO TABS
10.0000 mg | ORAL_TABLET | Freq: Every day | ORAL | Status: DC
  Administered 2014-10-15 – 2014-10-18 (×4): 10 mg via ORAL
  Filled 2014-10-15 (×5): qty 1

## 2014-10-15 MED ORDER — ENSURE COMPLETE PO LIQD
237.0000 mL | Freq: Two times a day (BID) | ORAL | Status: DC
  Administered 2014-10-15 – 2014-10-18 (×3): 237 mL via ORAL

## 2014-10-15 MED ORDER — ONDANSETRON HCL 4 MG/2ML IJ SOLN
4.0000 mg | Freq: Four times a day (QID) | INTRAMUSCULAR | Status: DC | PRN
  Administered 2014-10-15: 4 mg via INTRAVENOUS
  Filled 2014-10-15: qty 2

## 2014-10-15 MED ORDER — HYDROCODONE-ACETAMINOPHEN 5-325 MG PO TABS
1.0000 | ORAL_TABLET | ORAL | Status: DC | PRN
Start: 2014-10-15 — End: 2014-10-15
  Filled 2014-10-15: qty 1

## 2014-10-15 MED ORDER — HYDROCODONE-ACETAMINOPHEN 5-325 MG PO TABS
1.0000 | ORAL_TABLET | Freq: Four times a day (QID) | ORAL | Status: DC | PRN
  Administered 2014-10-15 (×2): 1 via ORAL
  Filled 2014-10-15 (×2): qty 1
  Filled 2014-10-15: qty 2

## 2014-10-15 MED ORDER — METHOCARBAMOL 1000 MG/10ML IJ SOLN
500.0000 mg | Freq: Four times a day (QID) | INTRAVENOUS | Status: DC | PRN
  Administered 2014-10-15 – 2014-10-16 (×2): 500 mg via INTRAVENOUS
  Filled 2014-10-15 (×5): qty 5

## 2014-10-15 MED ORDER — HEPARIN SODIUM (PORCINE) 5000 UNIT/ML IJ SOLN
5000.0000 [IU] | Freq: Three times a day (TID) | INTRAMUSCULAR | Status: DC
  Administered 2014-10-15 – 2014-10-16 (×3): 5000 [IU] via SUBCUTANEOUS
  Filled 2014-10-15 (×6): qty 1

## 2014-10-15 MED ORDER — MIRTAZAPINE 15 MG PO TABS
15.0000 mg | ORAL_TABLET | Freq: Every day | ORAL | Status: DC
  Administered 2014-10-15 – 2014-10-17 (×3): 15 mg via ORAL
  Filled 2014-10-15 (×5): qty 1

## 2014-10-15 MED ORDER — PROMETHAZINE HCL 25 MG/ML IJ SOLN
12.5000 mg | Freq: Four times a day (QID) | INTRAMUSCULAR | Status: DC | PRN
  Administered 2014-10-15 – 2014-10-16 (×2): 12.5 mg via INTRAVENOUS
  Filled 2014-10-15 (×2): qty 1

## 2014-10-15 MED ORDER — LEVOTHYROXINE SODIUM 100 MCG PO TABS
100.0000 ug | ORAL_TABLET | Freq: Every day | ORAL | Status: DC
  Administered 2014-10-17 – 2014-10-18 (×2): 100 ug via ORAL
  Filled 2014-10-15 (×5): qty 1

## 2014-10-15 NOTE — Progress Notes (Signed)
INITIAL NUTRITION ASSESSMENT  Pt meets criteria for severe MALNUTRITION in the context of chronic as evidenced by decreased muscle mass and intake <75% for > 1 month.  DOCUMENTATION CODES Per approved criteria  -Severe malnutrition in the context of chronic illness   INTERVENTION: Regular diet Mighty shakes with meals Ensure Complete po BID, each supplement provides 350 kcal and 13 grams of protein Assist with meals RD to monitor  NUTRITION DIAGNOSIS: Inadequate oral intake related to dementai as evidenced by observed poor po.   Goal: Intake of meals and supplements to meet >90% estimated needs.  Monitor:  Intake, labs, weight trend  Reason for Assessment: Consult for assessment of status and needs.  80 y.o. female  Admitting Dx: <principal problem not specified>  ASSESSMENT: Patient with hx of dementia and UTI admitted due to hip pain. Patient is from the [facility] dementia unit.  Patient very pleasant but unable to answer questions accurately.  Lunch tray in room.  Patient only ate a banana.  Nutrition Focused Physical Exam:  Subcutaneous Fat:  Orbital Region: wnl Upper Arm Region: mild Thoracic and Lumbar Region: n/a  Muscle:  Temple Region: mild Clavicle Bone Region: moderate Clavicle and Acromion Bone Region: wnl Scapular Bone Region: severe Dorsal Hand: severe Patellar Region: severe Anterior Thigh Region: n/a Posterior Calf Region: n/a  Edema: not noted    Height: Ht Readings from Last 1 Encounters:  10/15/14 5\' 2"  (1.575 m)    Weight: Wt Readings from Last 1 Encounters:  10/15/14 105 lb (47.628 kg)    Ideal Body Weight: 110 lbs  % Ideal Body Weight: 95  Wt Readings from Last 10 Encounters:  10/15/14 105 lb (47.628 kg)  08/16/14 105 lb (47.628 kg)  02/26/14 102 lb (46.267 kg)  02/12/14 102 lb (46.267 kg)  02/09/14 102 lb (46.267 kg)  10/08/13 113 lb 3.2 oz (51.347 kg)  10/28/11 107 lb 14.4 oz (48.943 kg)  10/26/11 107 lb 12.8  oz (48.898 kg)    Usual Body Weight: 102 lbs  % Usual Body Weight: 103   BMI:  Body mass index is 19.2 kg/(m^2).  Estimated Nutritional Needs: Kcal: 1300-1400 Protein: 55-65 gm Fluid: >1.3L daily.  Skin: wdl  Diet Order: Diet NPO time specified Diet regular  EDUCATION NEEDS: -Education not appropriate at this time   Intake/Output Summary (Last 24 hours) at 10/15/14 1709 Last data filed at 10/15/14 1300  Gross per 24 hour  Intake    118 ml  Output   1350 ml  Net  -1232 ml    Labs:   Recent Labs Lab 10/15/14 0700  NA 134*  K 3.9  CL 94*  CO2 28  BUN 11  CREATININE 0.65  CALCIUM 9.4  GLUCOSE 95    CBG (last 3)  No results for input(s): GLUCAP in the last 72 hours.  Scheduled Meds: . donepezil  23 mg Oral QHS  . escitalopram  10 mg Oral Daily  . heparin  5,000 Units Subcutaneous 3 times per day  . [START ON 10/16/2014] Influenza vac split quadrivalent PF  0.5 mL Intramuscular Tomorrow-1000  . [START ON 10/16/2014] levothyroxine  100 mcg Oral Q breakfast  . losartan  50 mg Oral Daily  . metoprolol tartrate  50 mg Oral BID  . mirtazapine  15 mg Oral QHS    Continuous Infusions:   Past Medical History  Diagnosis Date  . Urinary tract infection   . Cancer     non-hodgkins lymphoma   . Lymphoma  in remission     ca free 5 yrs  . Dementia   . Atrial fibrillation   . Hypertension   . Mitral valve prolapse   . Thyroid disease   . Memory loss   . Osteoporosis   . 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 Hip fracture, left 10/16/2011  . Femoral fracture 10/27/2011  . Pelvic fracture 10/08/2013  . HTN (hypertension) 10/20/2011  . DVT (deep venous thrombosis)     Past Surgical History  Procedure Laterality Date  . Eye surgery      bil.cataract with lens implants  . Hip arthroplasty  10/18/2011    Procedure: ARTHROPLASTY BIPOLAR HIP;  Surgeon: Mauri Pole;  Location: [facility];  Service: Orthopedics;  Laterality: Left;  depuy    Antonieta Iba, RD,  LDN Clinical Inpatient Dietitian Pager:  [phone] Weekend and after hours pager:  [phone]

## 2014-10-15 NOTE — ED Notes (Signed)
Patient transported to CT 

## 2014-10-15 NOTE — ED Provider Notes (Signed)
CSN: [id number]     Arrival date & time 10/15/14  0615 History   First MD Initiated Contact with Patient 10/15/14 [phone]     Chief Complaint  Patient presents with  . Hip Pain    Right Side  . Leg Pain    Right Side     (Consider location/radiation/quality/duration/timing/severity/associated sxs/prior Treatment) The history is provided by the EMS personnel and a relative. The history is limited by the condition of the patient.  Tasha Ball is a 80 y.o. female hx of dementia, UTI, afib on xarelto, here with right hip pain. She resides at [facility] at the dementia unit.she was found this morning with right hip pain. She was found in bed by staff. They didn't see her fall and patient unable to give any history. Has previous left hip fracture status post arthroplasty. She was noted to have right hip deformity this morning. Staff didn't observe any other injuries.    Level V caveat- dementia   Past Medical History  Diagnosis Date  . Urinary tract infection   . Cancer     non-hodgkins lymphoma   . Lymphoma in remission     ca free 5 yrs  . Dementia   . Atrial fibrillation   . Hypertension   . Mitral valve prolapse   . Thyroid disease   . Memory loss   . Osteoporosis   . 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 Hip fracture, left 10/16/2011  . Femoral fracture 10/27/2011  . Pelvic fracture 10/08/2013  . HTN (hypertension) 10/20/2011  . DVT (deep venous thrombosis)    Past Surgical History  Procedure Laterality Date  . Eye surgery      bil.cataract with lens implants  . Hip arthroplasty  10/18/2011    Procedure: ARTHROPLASTY BIPOLAR HIP;  Surgeon: Mauri Pole;  Location: [facility];  Service: Orthopedics;  Laterality: Left;  depuy   Family History  Problem Relation Age of Onset  . Heart disease Father   . Angina Father   . Stomach cancer Brother 36  . Coronary artery disease Brother    History  Substance Use Topics  . Smoking status: Never Smoker   . Smokeless tobacco: Never  Used  . Alcohol Use: No   OB History    No data available     Review of Systems  Unable to perform ROS: Dementia      Allergies  Review of patient's allergies indicates no known allergies.  Home Medications   Prior to Admission medications   Medication Sig Start Date End Date Taking? Authorizing Provider  b complex vitamins capsule Take 1 capsule by mouth daily.   Yes Historical Provider, MD  Cholecalciferol (VITAMIN D) 1000 UNITS capsule Take 1 capsule (1,000 Units total) by mouth daily. 02/09/14  Yes Gerlene Fee, NP  donepezil (ARICEPT) 23 MG TABS tablet Take 23 mg by mouth at bedtime.    Yes Historical Provider, MD  escitalopram (LEXAPRO) 10 MG tablet Take 10 mg by mouth daily.   Yes Historical Provider, MD  levothyroxine (SYNTHROID, LEVOTHROID) 100 MCG tablet Take 1 tablet (100 mcg total) by mouth daily. 02/09/14  Yes Gerlene Fee, NP  losartan (COZAAR) 50 MG tablet Take 50 mg by mouth daily.   Yes Historical Provider, MD  Memantine HCl ER (NAMENDA XR) 28 MG CP24 Take 28 mg by mouth daily.   Yes Historical Provider, MD  metoprolol tartrate (LOPRESSOR) 25 MG tablet Take 50 mg by mouth 2 (two) times daily. 10/11/13  Yes Melton Alar, PA-C  mirtazapine (REMERON) 15 MG tablet Take 15 mg by mouth at bedtime.   Yes Historical Provider, MD  psyllium (REGULOID) 0.52 G capsule Take 0.52 g by mouth daily.   Yes Historical Provider, MD  rivaroxaban (XARELTO) 10 MG TABS tablet Take 10 mg by mouth daily.   Yes Historical Provider, MD  traMADol (ULTRAM) 50 MG tablet 50 mg every 6 (six) hours as needed (pain in right hand). Take one tablet by mouth every 6 hours as needed for pain 02/07/14  Yes Estill Dooms, MD  enoxaparin (LOVENOX) 80 MG/0.8ML injection Inject 0.7 mLs (70 mg total) into the skin daily. 08/17/14   Modena Jansky, MD  Nutritional Supplements (NUTRITIONAL SUPPLEMENT PO) Take 1 Bottle by mouth daily.     Historical Provider, MD  Skin Protectants, Misc. (DIMETHICONE-ZINC  OXIDE) cream Apply 1 application topically 3 (three) times daily as needed for dry skin (redeness or open areas).    Historical Provider, MD   BP 145/92 mmHg  Pulse 68  Temp(Src) 98.3 F (36.8 C) (Oral)  Resp 15  Ht 5\' 2"  (1.575 m)  Wt 105 lb (47.628 kg)  BMI 19.20 kg/m2  SpO2 93% Physical Exam  Constitutional:  Chronically ill, demented   HENT:  Head: Normocephalic.  Mouth/Throat: Oropharynx is clear and moist.  ? Small scalp hematoma   Eyes: Conjunctivae are normal. Pupils are equal, round, and reactive to light.  Neck: Normal range of motion. Neck supple.  Cardiovascular: Normal rate, regular rhythm and normal heart sounds.   Pulmonary/Chest: Effort normal and breath sounds normal. No respiratory distress. She has no wheezes. She has no rales.  Abdominal: Soft. Bowel sounds are normal. She exhibits no distension. There is no tenderness. There is no rebound and no guarding.  Musculoskeletal:  R hip externally rotated, shortened. 2+ pulses, dec ROM hip.   Neurological: She is alert.  Demented, moving all extremities except R hip   Skin: Skin is warm and dry.  Psychiatric:  Unable   Nursing note and vitals reviewed.   ED Course  Procedures (including critical care time) Labs Review Labs Reviewed  BASIC METABOLIC PANEL - Abnormal; Notable for the following:    Sodium 134 (*)    Chloride 94 (*)    GFR calc non Af Amer 77 (*)    GFR calc Af Amer 89 (*)    All other components within normal limits  CBC WITH DIFFERENTIAL - Abnormal; Notable for the following:    RBC 3.86 (*)    HCT 35.8 (*)    Monocytes Relative 13 (*)    Monocytes Absolute 1.3 (*)    All other components within normal limits  PROTIME-INR - Abnormal; Notable for the following:    Prothrombin Time 25.3 (*)    INR 2.28 (*)    All other components within normal limits  PRO B NATRIURETIC PEPTIDE - Abnormal; Notable for the following:    Pro B Natriuretic peptide (BNP) 7993.0 (*)    All other components  within normal limits  TROPONIN I  URINALYSIS, ROUTINE W REFLEX MICROSCOPIC  TYPE AND SCREEN    Imaging Review Dg Chest 1 View  10/15/2014   CLINICAL DATA:  Preop respiratory exam for right hip fracture.  EXAM: CHEST - 1 VIEW  COMPARISON:  03/10/2014  FINDINGS: The heart is enlarged. There is calcification of the thoracic aorta. There may be venous hypertension/ S slight fluid overload but there is no frank edema. No infiltrate,  collapse or effusion.  IMPRESSION: Question venous hypertension/ mild fluid overload. No focal consolidation or collapse.   Electronically Signed   By: Nelson Chimes M.D.   On: 10/15/2014 07:07   Dg Hip Complete Right  10/15/2014   CLINICAL DATA:  Patient found in bed with foreshortened right leg with external rotation and pain.  EXAM: RIGHT HIP - COMPLETE 2+ VIEW  COMPARISON:  CT pelvis 10/07/2013.  Right hip 10/07/2013.  FINDINGS: Acute fracture of the right femoral neck with superior displacement of the distal fracture fragment resulting in varus angulation. No dislocation of the right hip. Old fracture deformities of the right symphysis pubis and left superior and inferior pubic rami. Previous left hip hemiarthroplasty. Degenerative changes in the lower lumbar spine.  IMPRESSION: Acute fracture right femoral neck with varus angulation. Old fracture deformities of right symphysis pubis and left superior and inferior pubic rami.   Electronically Signed   By: Lucienne Capers M.D.   On: 10/15/2014 07:05   Ct Head Wo Contrast  10/15/2014   CLINICAL DATA:  Evaluate for fall.  History of dementia.  EXAM: CT HEAD WITHOUT CONTRAST  CT CERVICAL SPINE WITHOUT CONTRAST  TECHNIQUE: Multidetector CT imaging of the head and cervical spine was performed following the standard protocol without intravenous contrast. Multiplanar CT image reconstructions of the cervical spine were also generated.  COMPARISON:  12/20/2012  FINDINGS: CT HEAD FINDINGS  There is low attenuation throughout the  subcortical and periventricular white matter compatible with chronic microvascular disease. Prominence of the sulci and ventricles noted consistent with brain atrophy. No evidence for acute intracranial hemorrhage, infarct or mass. No abnormal extra-axial fluid collections. Mild mucosal thickening involving the sphenoid sinus noted. The mastoid air cells appear clear. The skull is intact.  CT CERVICAL SPINE FINDINGS  Reversal of normal cervical lordosis noted. Marked multi level disc space narrowing and ventral endplate spurring is identified throughout the cervical spine. Anterolisthesis of C2 on C3 and C3 on C4 is likely related to advanced spondylosis. The facet joints all appear well-aligned. No fractures identified.  IMPRESSION: 1. No acute intracranial abnormalities. 2. Chronic small vessel ischemic disease and brain atrophy. 3. Severe multi level cervical spondylosis. No evidence for cervical spine fracture.   Electronically Signed   By: Kerby Moors M.D.   On: 10/15/2014 08:27   Ct Cervical Spine Wo Contrast  10/15/2014   CLINICAL DATA:  Evaluate for fall.  History of dementia.  EXAM: CT HEAD WITHOUT CONTRAST  CT CERVICAL SPINE WITHOUT CONTRAST  TECHNIQUE: Multidetector CT imaging of the head and cervical spine was performed following the standard protocol without intravenous contrast. Multiplanar CT image reconstructions of the cervical spine were also generated.  COMPARISON:  12/20/2012  FINDINGS: CT HEAD FINDINGS  There is low attenuation throughout the subcortical and periventricular white matter compatible with chronic microvascular disease. Prominence of the sulci and ventricles noted consistent with brain atrophy. No evidence for acute intracranial hemorrhage, infarct or mass. No abnormal extra-axial fluid collections. Mild mucosal thickening involving the sphenoid sinus noted. The mastoid air cells appear clear. The skull is intact.  CT CERVICAL SPINE FINDINGS  Reversal of normal cervical  lordosis noted. Marked multi level disc space narrowing and ventral endplate spurring is identified throughout the cervical spine. Anterolisthesis of C2 on C3 and C3 on C4 is likely related to advanced spondylosis. The facet joints all appear well-aligned. No fractures identified.  IMPRESSION: 1. No acute intracranial abnormalities. 2. Chronic small vessel ischemic disease and brain atrophy.  3. Severe multi level cervical spondylosis. No evidence for cervical spine fracture.   Electronically Signed   By: Kerby Moors M.D.   On: 10/15/2014 08:27     EKG Interpretation   Date/Time:  Monday October 15 2014 08:15:53 EST Ventricular Rate:  76 PR Interval:    QRS Duration: 101 QT Interval:  408 QTC Calculation: 459 R Axis:   -59 Text Interpretation:  Atrial fibrillation LAD, consider left anterior  fascicular block LVH with secondary repolarization abnormality Probable  anterior infarct, old No significant change since last tracing Confirmed  by Safari Cinque  MD, Kaylanie Capili ([id number]) on 10/15/2014 8:18:41 AM      MDM   Final diagnoses:  Tasha Ball is a 80 y.o. female here with R hip pain. Concerned for possible hip fracture. Given unknown head injury, will get CT head/neck. Will get preop labs, syncope workup.   7:50 AM  Called Dr. Alvan Dame, who plan on doing hip replacement tomorrow since she is on xarelto.   8:40 AM CT head/neck unremarkable. Pulse ox 90% on RA. CXR showed mild fluid overload. BNP elevated. Has diastolic dysfunction. Likely in CHF exacerbation as well. Given lasix. Will admit to tele.   Wandra Arthurs, MD 10/15/14 [phone]

## 2014-10-15 NOTE — ED Notes (Signed)
Pt from [facility] home

## 2014-10-15 NOTE — ED Notes (Signed)
RN and tech attempted 2 times to insert foley cath with no success. Will find another staff member to try to access.

## 2014-10-15 NOTE — ED Notes (Signed)
Right leg is shorter than left and external rotated.

## 2014-10-15 NOTE — ED Notes (Signed)
This tech and another RN tried 2 more attempts at inserting a foley cath and we were unsuccessful. Will notify MD.

## 2014-10-15 NOTE — H&P (Addendum)
[facility] History and Physical  OSCAR FORMAN IWL:[id number] DOB: January 14, 1924 DOA: 10/15/2014  Referring physician: ED physician PCP: Jerlyn Ly, MD   Chief Complaint: right hip pain  HPI:  80 year old female with past medical history of dementia, hypothyroidism, hypertension, DVT on anticoagulation with xarelto who [facility] with right hip pain. There was no witnessed fall, patient was found in her bed. Patient is not good historian due to history of dementia. No reports of respiratory distress. No reports of fevers. No reports of chest pain. No diarrhea or constipation. No vomiting. No reports of loss  In ED, BP is 172/95 and then low at 95/65, HR 66, T max 97.3 F and oxygen saturation 94% on room air. CXR showed questionable venous hypertension/ mild fluid overload but no consolidation or collapse. CT head and cervical spine showed no acute intracranial findings. Right hip x ray showed acute fracture of the right femoral neck with varus angulation. Per Dr. Alvan Dame of orthopedic surgery, plan for surgery tomorrow since pt on anticoagulation.   Assessment and Plan:  Principal Problem:   Fracture of femoral neck, right - unclear if there was a fall or not; fracture confirmed on right hip x ray  - plan for surgery tomorrow - hold anticoagulation - PT evaluation once pt able to participate  - pain management with morphine 0.5 mg every 2 hours IV PRN severe pain Active Problems:   Atrial fibrillation  - on anticoagulation with xarelto - AC on hold due to anticipated surgery in am - rate controlled with metoprolol    HTN (hypertension), essential  - BP meds on hold due to hypotension - if BP improves, may resume metoprolol and losartan    Hypothyroidism - resume synthroid    Dementia without behavioral disturbance / depression - continue aricept, namenda, lexapro    DVT of upper extremity (deep vein thrombosis) - AC on hold due to surgery  tomorrow   Protein-calorie malnutrition, severe - nutrition consulted    Code Status: DNR/DNI Family Communication: Son at the bedside  Disposition Plan: Admit for further evaluation     Radiological Exams on Admission:  Dg Chest 1 View 10/15/2014    Question venous hypertension/ mild fluid overload. No focal consolidation or collapse.     Dg Hip Complete Right 10/15/2014   Acute fracture right femoral neck with varus angulation. Old fracture deformities of right symphysis pubis and left superior and inferior pubic rami.     Ct Head Wo Contrast 10/15/2014   1. No acute intracranial abnormalities. 2. Chronic small vessel ischemic disease and brain atrophy. 3. Severe multi level cervical spondylosis. No evidence for cervical spine fracture.     Ct Cervical Spine Wo Contrast 10/15/2014  1. No acute intracranial abnormalities. 2. Chronic small vessel ischemic disease and brain atrophy. 3. Severe multi level cervical spondylosis. No evidence for cervical spine fracture.     Faye Ramsay, MD  [facility] Pager [phone]   Review of Systems:  Unable to obtain due to dementia   Past Medical History  Diagnosis Date  . Urinary tract infection   . Cancer     non-hodgkins lymphoma   . Lymphoma in remission     ca free 5 yrs  . Dementia   . Atrial fibrillation   . Hypertension   . Mitral valve prolapse   . Thyroid disease   . Memory loss   . Osteoporosis   . 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 Hip  fracture, left 10/16/2011  . Femoral fracture 10/27/2011  . Pelvic fracture 10/08/2013  . HTN (hypertension) 10/20/2011  . DVT (deep venous thrombosis)     Past Surgical History  Procedure Laterality Date  . Eye surgery      bil.cataract with lens implants  . Hip arthroplasty  10/18/2011    Procedure: ARTHROPLASTY BIPOLAR HIP;  Surgeon: Mauri Pole;  Location: [facility];  Service: Orthopedics;  Laterality: Left;  depuy    Social History:  reports that she has never smoked.  She has never used smokeless tobacco. She reports that she does not drink alcohol or use illicit drugs.  No Known Allergies  Family History  Problem Relation Age of Onset  . Heart disease Father   . Angina Father   . Stomach cancer Brother 60  . Coronary artery disease Brother     Prior to Admission medications   Medication Sig Start Date End Date Taking? Authorizing Provider  b complex vitamins capsule Take 1 capsule by mouth daily.   Yes Historical Provider, MD  Cholecalciferol (VITAMIN D) 1000 UNITS capsule Take 1 capsule (1,000 Units total) by mouth daily. 02/09/14  Yes Gerlene Fee, NP  donepezil (ARICEPT) 23 MG TABS tablet Take 23 mg by mouth at bedtime.    Yes Historical Provider, MD  escitalopram (LEXAPRO) 10 MG tablet Take 10 mg by mouth daily.   Yes Historical Provider, MD  levothyroxine (SYNTHROID, LEVOTHROID) 100 MCG tablet Take 1 tablet (100 mcg total) by mouth daily. 02/09/14  Yes Gerlene Fee, NP  losartan (COZAAR) 50 MG tablet Take 50 mg by mouth daily.   Yes Historical Provider, MD  Memantine HCl ER (NAMENDA XR) 28 MG CP24 Take 28 mg by mouth daily.   Yes Historical Provider, MD  metoprolol tartrate (LOPRESSOR) 25 MG tablet Take 50 mg by mouth 2 (two) times daily. 10/11/13  Yes Marianne L York, PA-C  mirtazapine (REMERON) 15 MG tablet Take 15 mg by mouth at bedtime.   Yes Historical Provider, MD  psyllium (REGULOID) 0.52 G capsule Take 0.52 g by mouth daily.   Yes Historical Provider, MD  rivaroxaban (XARELTO) 10 MG TABS tablet Take 10 mg by mouth daily.   Yes Historical Provider, MD  traMADol (ULTRAM) 50 MG tablet 50 mg every 6 (six) hours as needed (pain in right hand). Take one tablet by mouth every 6 hours as needed for pain 02/07/14  Yes Estill Dooms, MD  enoxaparin (LOVENOX) 80 MG/0.8ML injection Inject 0.7 mLs (70 mg total) into the skin daily. 08/17/14   Modena Jansky, MD  Nutritional Supplements (NUTRITIONAL SUPPLEMENT PO) Take 1 Bottle by mouth daily.      Historical Provider, MD  Skin Protectants, Misc. (DIMETHICONE-ZINC OXIDE) cream Apply 1 application topically 3 (three) times daily as needed for dry skin (redeness or open areas).    Historical Provider, MD    Physical Exam: Filed Vitals:   10/15/14 0610 10/15/14 0615 10/15/14 0816  BP:  160/89 145/92  Pulse:  66 68  Temp:  98.3 F (36.8 C)   TempSrc:  Oral   Resp:  18 15  Height:  5\' 2"  (1.575 m)   Weight:  47.628 kg (105 lb)   SpO2: 94% 94% 93%    Physical Exam  Constitutional: Appears well-developed and well-nourished. No distress.  HENT: small scalp hematoma. Normal oral mucosa, no tonsillar erythema or exudates.  Eyes: Conjunctivae and EOM are normal. PERRLA, no scleral icterus.  Neck: Neck supple. No  JVD. No tracheal deviation. No thyromegaly.  CVS: RRR, S1/S2 appreciated  Pulmonary: Effort and breath sounds normal, no stridor, rhonchi, wheezes, rales.  Abdominal: Soft. BS +,  no distension, tenderness, rebound or guarding.  Musculoskeletal: right hip tenderness, no edema.  Lymphadenopathy: No lymphadenopathy noted, cervical, inguinal. Neuro: Alert. No cranial nerve deficit. Skin: Skin is warm and dry.   Psychiatric: Normal mood and affect.   Labs on Admission:  Basic Metabolic Panel:  Recent Labs Lab 10/15/14 0700  NA 134*  K 3.9  CL 94*  CO2 28  GLUCOSE 95  BUN 11  CREATININE 0.65  CALCIUM 9.4   Liver Function Tests: No results for input(s): AST, ALT, ALKPHOS, BILITOT, PROT, ALBUMIN in the last 168 hours. No results for input(s): LIPASE, AMYLASE in the last 168 hours. No results for input(s): AMMONIA in the last 168 hours. CBC:  Recent Labs Lab 10/15/14 0700  WBC 10.0  NEUTROABS 7.3  HGB 12.1  HCT 35.8*  MCV 92.7  PLT 214   Cardiac Enzymes:  Recent Labs Lab 10/15/14 0700  TROPONINI <0.30   BNP: Invalid input(s): POCBNP CBG: No results for input(s): GLUCAP in the last 168 hours.     If 7PM-7AM, please contact  night-coverage [email or web address] Password [email or web address] 10/15/2014, 8:51 AM

## 2014-10-15 NOTE — ED Notes (Signed)
MD at bedside. 

## 2014-10-15 NOTE — Plan of Care (Signed)
Problem: Phase I Progression Outcomes Goal: Pre op pain controlled with appropriate interventions Outcome: Progressing Goal: Pre op Medical MD consult, if indicated Outcome: Completed/Met Date Met:  10/15/14 Goal: Pre op labs/procedures/consults per MD order Outcome: Completed/Met Date Met:  10/15/14 Goal: Pre op NPO per MD orders Outcome: Completed/Met Date Met:  10/15/14 Goal: Pre op-initial discharge plan identified Outcome: Completed/Met Date Met:  10/15/14 Goal: Pre op hemodynamically stable Outcome: Completed/Met Date Met:  10/15/14

## 2014-10-15 NOTE — ED Notes (Addendum)
Per EMS, patient is complaining of right hip and right leg pain. Denies any known injury. Pt usually ambulates with assistance. Right leg is shorten and external rotated.

## 2014-10-15 NOTE — ED Notes (Signed)
Bed: QZ00 Expected date:  Expected time:  Means of arrival:  Comments: EMS 80 yo female right leg and hip pain-unknown if she fell-shortness right leg/from [facility]

## 2014-10-16 ENCOUNTER — Inpatient Hospital Stay (HOSPITAL_COMMUNITY): Payer: Medicare Other

## 2014-10-16 ENCOUNTER — Inpatient Hospital Stay (HOSPITAL_COMMUNITY): Payer: Medicare Other | Admitting: Registered Nurse

## 2014-10-16 ENCOUNTER — Encounter (HOSPITAL_COMMUNITY)
Admission: EM | Disposition: A | Discharge status: Skilled Nursing Facility | Payer: Self-pay | Source: Home / Self Care | Attending: Internal Medicine

## 2014-10-16 ENCOUNTER — Encounter (HOSPITAL_COMMUNITY): Payer: Self-pay | Admitting: Anesthesiology

## 2014-10-16 DIAGNOSIS — S72001A Fracture of unspecified part of neck of right femur, initial encounter for closed fracture: Secondary | ICD-10-CM | POA: Diagnosis not present

## 2014-10-16 DIAGNOSIS — I482 Chronic atrial fibrillation: Secondary | ICD-10-CM

## 2014-10-16 HISTORY — PX: HIP ARTHROPLASTY: SHX981

## 2014-10-16 LAB — CBC
HCT: 39 % (ref 36.0–46.0)
Hemoglobin: 13.3 g/dL (ref 12.0–15.0)
MCH: 31.8 pg (ref 26.0–34.0)
MCHC: 34.1 g/dL (ref 30.0–36.0)
MCV: 93.3 fL (ref 78.0–100.0)
Platelets: 172 10*3/uL (ref 150–400)
RBC: 4.18 MIL/uL (ref 3.87–5.11)
RDW: 13.9 % (ref 11.5–15.5)
WBC: 7.7 10*3/uL (ref 4.0–10.5)

## 2014-10-16 LAB — SURGICAL PCR SCREEN
MRSA, PCR: NEGATIVE
Staphylococcus aureus: NEGATIVE

## 2014-10-16 LAB — BASIC METABOLIC PANEL
ANION GAP: 14 (ref 5–15)
BUN: 18 mg/dL (ref 6–23)
CO2: 30 mEq/L (ref 19–32)
Calcium: 9.3 mg/dL (ref 8.4–10.5)
Chloride: 91 mEq/L — ABNORMAL LOW (ref 96–112)
Creatinine, Ser: 1.19 mg/dL — ABNORMAL HIGH (ref 0.50–1.10)
GFR, EST AFRICAN AMERICAN: 46 mL/min — AB (ref >90)
GFR, EST NON AFRICAN AMERICAN: 40 mL/min — AB (ref >90)
Glucose, Bld: 82 mg/dL (ref 70–99)
Potassium: 3.8 mEq/L (ref 3.7–5.3)
SODIUM: 135 meq/L — AB (ref 137–147)

## 2014-10-16 LAB — PROTIME-INR
INR: 1.53 — AB (ref 0.00–1.49)
Prothrombin Time: 18.5 seconds — ABNORMAL HIGH (ref 11.6–15.2)

## 2014-10-16 SURGERY — HEMIARTHROPLASTY, HIP, DIRECT ANTERIOR APPROACH, FOR FRACTURE
Anesthesia: General | Site: Hip | Laterality: Right

## 2014-10-16 MED ORDER — LIDOCAINE HCL (CARDIAC) 20 MG/ML IV SOLN
INTRAVENOUS | Status: AC
  Filled 2014-10-16: qty 5

## 2014-10-16 MED ORDER — FENTANYL CITRATE 0.05 MG/ML IJ SOLN
INTRAMUSCULAR | Status: AC
  Filled 2014-10-16: qty 2

## 2014-10-16 MED ORDER — PHENOL 1.4 % MT LIQD
1.0000 | OROMUCOSAL | Status: DC | PRN
  Filled 2014-10-16: qty 177

## 2014-10-16 MED ORDER — ONDANSETRON HCL 4 MG/2ML IJ SOLN
INTRAMUSCULAR | Status: DC | PRN
  Administered 2014-10-16: 4 mg via INTRAVENOUS

## 2014-10-16 MED ORDER — METOCLOPRAMIDE HCL 10 MG PO TABS
5.0000 mg | ORAL_TABLET | Freq: Three times a day (TID) | ORAL | Status: DC | PRN
Start: 2014-10-16 — End: 2014-10-18

## 2014-10-16 MED ORDER — ACETAMINOPHEN 325 MG PO TABS
650.0000 mg | ORAL_TABLET | Freq: Four times a day (QID) | ORAL | Status: DC | PRN
  Administered 2014-10-17: 650 mg via ORAL
  Filled 2014-10-16: qty 2

## 2014-10-16 MED ORDER — LACTATED RINGERS IV SOLN
INTRAVENOUS | Status: DC
  Administered 2014-10-16 (×2): 1000 mL via INTRAVENOUS

## 2014-10-16 MED ORDER — ONDANSETRON HCL 4 MG/2ML IJ SOLN: 4.0000 mg | Freq: Four times a day (QID) | INTRAMUSCULAR | Status: DC | PRN

## 2014-10-16 MED ORDER — ENOXAPARIN SODIUM 30 MG/0.3ML ~~LOC~~ SOLN
30.0000 mg | SUBCUTANEOUS | Status: DC
  Administered 2014-10-17 – 2014-10-18 (×2): 30 mg via SUBCUTANEOUS
  Filled 2014-10-16 (×4): qty 0.3

## 2014-10-16 MED ORDER — FENTANYL CITRATE 0.05 MG/ML IJ SOLN
INTRAMUSCULAR | Status: DC | PRN
  Administered 2014-10-16: 25 ug via INTRAVENOUS
  Administered 2014-10-16: 50 ug via INTRAVENOUS
  Administered 2014-10-16: 25 ug via INTRAVENOUS

## 2014-10-16 MED ORDER — CEFAZOLIN SODIUM-DEXTROSE 2-3 GM-% IV SOLR
2.0000 g | Freq: Once | INTRAVENOUS | Status: AC
  Administered 2014-10-16: 2 g via INTRAVENOUS

## 2014-10-16 MED ORDER — ACETAMINOPHEN 650 MG RE SUPP: 650.0000 mg | Freq: Four times a day (QID) | RECTAL | Status: DC | PRN

## 2014-10-16 MED ORDER — PHENYLEPHRINE HCL 10 MG/ML IJ SOLN
INTRAMUSCULAR | Status: AC
  Filled 2014-10-16: qty 2

## 2014-10-16 MED ORDER — PROPOFOL 10 MG/ML IV BOLUS
INTRAVENOUS | Status: AC
  Filled 2014-10-16: qty 20

## 2014-10-16 MED ORDER — SODIUM CHLORIDE 0.9 % IV SOLN
INTRAVENOUS | Status: DC
  Administered 2014-10-16: 23:00:00 via INTRAVENOUS

## 2014-10-16 MED ORDER — EPHEDRINE SULFATE 50 MG/ML IJ SOLN
INTRAMUSCULAR | Status: DC | PRN
  Administered 2014-10-16: 5 mg via INTRAVENOUS

## 2014-10-16 MED ORDER — FENTANYL CITRATE 0.05 MG/ML IJ SOLN
50.0000 ug | Freq: Once | INTRAMUSCULAR | Status: AC
  Administered 2014-10-16: 25 ug via INTRAVENOUS

## 2014-10-16 MED ORDER — MORPHINE SULFATE 2 MG/ML IJ SOLN
0.5000 mg | INTRAMUSCULAR | Status: DC | PRN
  Administered 2014-10-17: 0.5 mg via INTRAVENOUS
  Filled 2014-10-16: qty 1

## 2014-10-16 MED ORDER — CEFAZOLIN SODIUM 1-5 GM-% IV SOLN
1.0000 g | Freq: Four times a day (QID) | INTRAVENOUS | Status: AC
  Administered 2014-10-16 – 2014-10-17 (×2): 1 g via INTRAVENOUS
  Filled 2014-10-16 (×2): qty 50

## 2014-10-16 MED ORDER — SUCCINYLCHOLINE CHLORIDE 20 MG/ML IJ SOLN
INTRAMUSCULAR | Status: DC | PRN
  Administered 2014-10-16: 80 mg via INTRAVENOUS

## 2014-10-16 MED ORDER — 0.9 % SODIUM CHLORIDE (POUR BTL) OPTIME
TOPICAL | Status: DC | PRN
  Administered 2014-10-16: 1000 mL

## 2014-10-16 MED ORDER — FENTANYL CITRATE 0.05 MG/ML IJ SOLN: 25.0000 ug | INTRAMUSCULAR | Status: DC | PRN

## 2014-10-16 MED ORDER — PROPOFOL 10 MG/ML IV BOLUS
INTRAVENOUS | Status: DC | PRN
  Administered 2014-10-16: 60 mg via INTRAVENOUS

## 2014-10-16 MED ORDER — DEXTROSE 5 % IV SOLN
20.0000 mg | INTRAVENOUS | Status: DC | PRN
  Administered 2014-10-16: 50 ug/min via INTRAVENOUS

## 2014-10-16 MED ORDER — POLYETHYLENE GLYCOL 3350 17 G PO PACK
17.0000 g | PACK | Freq: Every day | ORAL | Status: DC | PRN
  Filled 2014-10-16: qty 1

## 2014-10-16 MED ORDER — ONDANSETRON HCL 4 MG PO TABS: 4.0000 mg | ORAL_TABLET | Freq: Four times a day (QID) | ORAL | Status: DC | PRN

## 2014-10-16 MED ORDER — ACETAMINOPHEN 500 MG PO TABS: 500.0000 mg | ORAL_TABLET | Freq: Four times a day (QID) | ORAL | Status: AC | PRN

## 2014-10-16 MED ORDER — MENTHOL 3 MG MT LOZG
1.0000 | LOZENGE | OROMUCOSAL | Status: DC | PRN
  Filled 2014-10-16: qty 9

## 2014-10-16 MED ORDER — LIDOCAINE HCL (CARDIAC) 20 MG/ML IV SOLN
INTRAVENOUS | Status: DC | PRN
  Administered 2014-10-16: 50 mg via INTRAVENOUS

## 2014-10-16 MED ORDER — METOCLOPRAMIDE HCL 5 MG/ML IJ SOLN
5.0000 mg | Freq: Three times a day (TID) | INTRAMUSCULAR | Status: DC | PRN
Start: 2014-10-16 — End: 2014-10-18

## 2014-10-16 MED ORDER — ONDANSETRON HCL 4 MG/2ML IJ SOLN
INTRAMUSCULAR | Status: AC
  Filled 2014-10-16: qty 2

## 2014-10-16 MED ORDER — DOCUSATE SODIUM 100 MG PO CAPS
100.0000 mg | ORAL_CAPSULE | Freq: Two times a day (BID) | ORAL | Status: DC
  Administered 2014-10-16 – 2014-10-18 (×4): 100 mg via ORAL
  Filled 2014-10-16 (×5): qty 1

## 2014-10-16 MED ORDER — HYDROCODONE-ACETAMINOPHEN 5-325 MG PO TABS
1.0000 | ORAL_TABLET | Freq: Four times a day (QID) | ORAL | Status: DC | PRN
  Administered 2014-10-16: 1 via ORAL
  Administered 2014-10-17: 2 via ORAL
  Administered 2014-10-17: 1 via ORAL
  Administered 2014-10-18 (×2): 2 via ORAL
  Filled 2014-10-16: qty 2
  Filled 2014-10-16: qty 1
  Filled 2014-10-16 (×2): qty 2
  Filled 2014-10-16: qty 1

## 2014-10-16 MED ORDER — CEFAZOLIN SODIUM-DEXTROSE 2-3 GM-% IV SOLR
INTRAVENOUS | Status: AC
  Filled 2014-10-16: qty 50

## 2014-10-16 MED ORDER — PHENYLEPHRINE HCL 10 MG/ML IJ SOLN
INTRAMUSCULAR | Status: DC | PRN
  Administered 2014-10-16 (×2): 80 ug via INTRAVENOUS

## 2014-10-16 SURGICAL SUPPLY — 47 items
BAG ZIPLOCK 12X15 (MISCELLANEOUS) ×3 IMPLANT
BLADE SAW SGTL 18X1.27X75 (BLADE) ×2 IMPLANT
BLADE SAW SGTL 18X1.27X75MM (BLADE) ×1
CAPT HIP FX BIPOLAR/UNIPOLAR ×3 IMPLANT
CLOSURE WOUND 1/2 X4 (GAUZE/BANDAGES/DRESSINGS)
DRAPE INCISE IOBAN 85X60 (DRAPES) ×3 IMPLANT
DRAPE POUCH INSTRU U-SHP 10X18 (DRAPES) ×3 IMPLANT
DRAPE SPLIT 77X100IN (DRAPES) ×6 IMPLANT
DRAPE SURG 17X11 SM STRL (DRAPES) ×3 IMPLANT
DRAPE U-SHAPE 47X51 STRL (DRAPES) ×3 IMPLANT
DRSG AQUACEL AG ADV 3.5X10 (GAUZE/BANDAGES/DRESSINGS) ×3 IMPLANT
DRSG TEGADERM 4X4.75 (GAUZE/BANDAGES/DRESSINGS) IMPLANT
DURAPREP 26ML APPLICATOR (WOUND CARE) ×3 IMPLANT
ELECT BLADE TIP CTD 4 INCH (ELECTRODE) ×3 IMPLANT
ELECT REM PT RETURN 9FT ADLT (ELECTROSURGICAL) ×3
ELECTRODE REM PT RTRN 9FT ADLT (ELECTROSURGICAL) ×1 IMPLANT
EVACUATOR 1/8 PVC DRAIN (DRAIN) IMPLANT
FACESHIELD WRAPAROUND (MASK) ×12 IMPLANT
GAUZE SPONGE 2X2 8PLY STRL LF (GAUZE/BANDAGES/DRESSINGS) IMPLANT
GLOVE BIOGEL PI IND STRL 7.5 (GLOVE) ×1 IMPLANT
GLOVE BIOGEL PI IND STRL 8.5 (GLOVE) ×1 IMPLANT
GLOVE BIOGEL PI INDICATOR 7.5 (GLOVE) ×2
GLOVE BIOGEL PI INDICATOR 8.5 (GLOVE) ×2
GLOVE ECLIPSE 8.0 STRL XLNG CF (GLOVE) IMPLANT
GLOVE ORTHO TXT STRL SZ7.5 (GLOVE) ×6 IMPLANT
GLOVE SURG ORTHO 8.0 STRL STRW (GLOVE) ×3 IMPLANT
GOWN SPEC L3 XXLG W/TWL (GOWN DISPOSABLE) ×6 IMPLANT
GOWN STRL REUS W/TWL LRG LVL3 (GOWN DISPOSABLE) ×3 IMPLANT
HANDPIECE INTERPULSE COAX TIP (DISPOSABLE) ×2
IMMOBILIZER KNEE 20 (SOFTGOODS)
IMMOBILIZER KNEE 20 THIGH 36 (SOFTGOODS) IMPLANT
KIT BASIN OR (CUSTOM PROCEDURE TRAY) ×3 IMPLANT
LIQUID BAND (GAUZE/BANDAGES/DRESSINGS) ×3 IMPLANT
MANIFOLD NEPTUNE II (INSTRUMENTS) ×3 IMPLANT
PACK TOTAL JOINT (CUSTOM PROCEDURE TRAY) ×3 IMPLANT
POSITIONER SURGICAL ARM (MISCELLANEOUS) ×6 IMPLANT
SET HNDPC FAN SPRY TIP SCT (DISPOSABLE) ×1 IMPLANT
SPONGE GAUZE 2X2 STER 10/PKG (GAUZE/BANDAGES/DRESSINGS)
STRIP CLOSURE SKIN 1/2X4 (GAUZE/BANDAGES/DRESSINGS) IMPLANT
SUT ETHIBOND NAB CT1 #1 30IN (SUTURE) IMPLANT
SUT MNCRL AB 4-0 PS2 18 (SUTURE) ×3 IMPLANT
SUT VIC AB 1 CT1 36 (SUTURE) ×6 IMPLANT
SUT VIC AB 2-0 CT1 27 (SUTURE) ×4
SUT VIC AB 2-0 CT1 TAPERPNT 27 (SUTURE) ×2 IMPLANT
SUT VLOC 180 0 24IN GS25 (SUTURE) ×3 IMPLANT
TOWEL OR 17X26 10 PK STRL BLUE (TOWEL DISPOSABLE) ×3 IMPLANT
TRAY FOLEY CATH 14FRSI W/METER (CATHETERS) IMPLANT

## 2014-10-16 NOTE — Progress Notes (Signed)
Clinical Social Work Department BRIEF PSYCHOSOCIAL ASSESSMENT 10/16/2014  Patient:  Tasha Ball, Tasha Ball     Account Number:  [id number]     Admit date:  10/15/2014  Clinical Social Worker:  Glorious Peach, [place]  Date/Time:  10/16/2014 10:21 AM  Referred by:  Physician  Date Referred:  10/16/2014 Referred for  SNF Placement   Other Referral:   Interview type:  Family Other interview type:    PSYCHOSOCIAL DATA Living Status:  FACILITY Admitted from facility:  Other Level of care:  Assisted Living Primary support name:  T[facility] Primary support relationship to patient:  CHILD, ADULT Degree of support available:   Adequate    CURRENT CONCERNS Current Concerns  Post-Acute Placement   Other Concerns:    SOCIAL WORK ASSESSMENT / PLAN CSW received consult from MD due to patient being admitted for hip fracture and the need for short term rehab.   Assessment/plan status:   Other assessment/ plan:   Information/referral to community resources:    PATIENT'S/FAMILY'S RESPONSE TO PLAN OF CARE: CSW spoke with patient son Tasha Ball) via phone. CSW introduced self and explained her role. CSW inquired about patient living situation before being admitted to the hospital, son confirmed that patient is a resident at [facility] care unit, which is an assisted living facility. CSW informed son that PT recommends short term rehab when patient undergoes a surgery for fractures or anything pertaining to a patient's inability to ambulate themselves efficiently. Son completely understands that plan and is agreeable to short term rehab. Son also states that patient has been to [facility], and that would be the ideal facility to send patient to when medically stable. CSW assured patient son that she would fax patient clinicals out to [facility], and continue to follow patient for further DC planning.       Glorious Peach BSW Intern

## 2014-10-16 NOTE — Progress Notes (Addendum)
Patient ID: Tasha Ball, female   DOB: 01/09/1924, 80 y.o.   MRN: [id number]  [facility] PROGRESS NOTE  LUCEAL HOLLIBAUGH VFI:[id number] DOB: 03/24/1924 DOA: 10/15/2014 PCP: Jerlyn Ly, MD  Brief narrative: 80 year old female with past medical history of dementia, hypothyroidism, hypertension, DVT on anticoagulation with xarelto who presented to [facility] ED11/16/2015 from SNF with right hip pain. There was no witnessed fall, patient was found in her bed. Patient is not good historian due to history of dementia. No reports of respiratory distress. No reports of fevers. No reports of chest pain. No diarrhea or constipation. No vomiting.   In ED, BP is 172/95 and then low at 95/65, HR 66, T max 97.3 F and oxygen saturation 94% on room air. CXR showed questionable venous hypertension/ mild fluid overload but no consolidation or collapse. CT head and cervical spine showed no acute intracranial findings. Right hip x ray showed acute fracture of the right femoral neck with varus angulation. Per Dr. Alvan Dame of orthopedic surgery, plan for surgery tomorrow since pt on anticoagulation.   Assessment and Plan:   Principal Problem:  Fracture of femoral neck, right - unclear if there was a fall or not; fracture confirmed on right hip x ray  - plan for surgery today - hold anticoagulation until after surgery  - PT evaluation once pt able to participate  - pain management with morphine 0.5 mg every 2 hours IV PRN severe pain Active Problems:   Acute renal insufficiency - likely pre renal from poor oral intake - IVF pre op and repeat BMP in AM  Atrial fibrillation, rate controlled - in sinus rhythm this AM - on anticoagulation with xarelto - AC on hold due to anticipated surgery in am - rate controlled with metoprolol   HTN (hypertension), essential  - BP meds on hold due to hypotension - if BP improves, may resume metoprolol and losartan   Hypothyroidism - resume synthroid   Dementia  without behavioral disturbance / depression - continue aricept, namenda, lexapro   DVT of upper extremity (deep vein thrombosis) - AC on hold due to surgery tomorrow  Protein-calorie malnutrition, severe - nutrition consulted   DVT prophylaxis: now on Heparin SQ pre op, resume Xarelto post op  Code Status: DNR/DNI Family Communication: Son at the bedside  Disposition Plan: Remains inpatient   IV Access:   Peripheral IV Procedures and diagnostic studies:    CXR 10/15/2014 Question venous hypertension/ mild fluid overload. No focal consolidation or collapse.   Dg Hip Complete Right 10/15/2014 Acute fracture right femoral neck with varus angulation. Old fracture deformities of right symphysis pubis and left superior and inferior pubic rami.   Ct Head Wo Contrast 10/15/2014 1. No acute intracranial abnormalities. 2. Chronic small vessel ischemic disease and brain atrophy. 3. Severe multi level cervical spondylosis. No evidence for cervical spine fracture.   Ct Cervical Spine Wo Contrast 10/15/2014 1. No acute intracranial abnormalities. 2. Chronic small vessel ischemic disease and brain atrophy. 3. Severe multi level cervical spondylosis. No evidence for cervical spine fracture.  Medical Consultants:   Ortho  Other Consultants:   Physical therapy  Anti-Infectives:   None   Faye Ramsay, MD  [facility] Pager [phone]  If 7PM-7AM, please contact night-coverage [email or web address] Password [email or web address] 10/16/2014, 12:06 PM   LOS: 1 day   HPI/Subjective: No events overnight.   Objective: Filed Vitals:   10/16/14 0400 10/16/14 0538 10/16/14 0800 10/16/14 0918  BP:  137/69  125/79  Pulse:  62  63  Temp:  97.6 F (36.4 C)    TempSrc:  Oral    Resp: 18 16 16    Height:      Weight:      SpO2:  95% 96%     Intake/Output Summary (Last 24 hours) at 10/16/14 1206 Last data filed at 10/15/14 2017  Gross per 24 hour  Intake    118 ml  Output   2550 ml  Net  -2432 ml     Exam:   General:  Pt is alert, follows commands appropriately, not in acute distress  Cardiovascular: Regular rate and rhythm, no rubs, no gallops  Respiratory: Clear to auscultation bilaterally, no wheezing, no crackles, no rhonchi  Abdomen: Soft, non tender, non distended, bowel sounds present, no guarding  Extremities: No edema, pulses DP and PT palpable bilaterally  Data Reviewed: Basic Metabolic Panel:  Recent Labs Lab 10/15/14 0700 10/16/14 0511  NA 134* 135*  K 3.9 3.8  CL 94* 91*  CO2 28 30  GLUCOSE 95 82  BUN 11 18  CREATININE 0.65 1.19*  CALCIUM 9.4 9.3   CBC:  Recent Labs Lab 10/15/14 0700 10/16/14 0511  WBC 10.0 7.7  NEUTROABS 7.3  --   HGB 12.1 13.3  HCT 35.8* 39.0  MCV 92.7 93.3  PLT 214 172   Cardiac Enzymes:  Recent Labs Lab 10/15/14 0700  TROPONINI <0.30     Recent Results (from the past 240 hour(s))  Surgical pcr screen     Status: None   Collection Time: 10/16/14  8:55 AM  Result Value Ref Range Status   MRSA, PCR NEGATIVE NEGATIVE Final   Staphylococcus aureus NEGATIVE NEGATIVE Final    Comment:        The Xpert SA Assay (FDA approved for NASAL specimens in patients over 57 years of age), is one component of a comprehensive surveillance program.  Test performance has been validated by [employer] for patients greater than or equal to 24 year old. It is not intended to diagnose infection nor to guide or monitor treatment.      Scheduled Meds: . donepezil  23 mg Oral QHS  . escitalopram  10 mg Oral Daily  . feeding supplement (ENSURE COMPLETE)  237 mL Oral BID BM  . heparin  5,000 Units Subcutaneous 3 times per day  . Influenza vac split quadrivalent PF  0.5 mL Intramuscular Tomorrow-1000  . levothyroxine  100 mcg Oral Q breakfast  . losartan  50 mg Oral Daily  . metoprolol tartrate  50 mg Oral BID  . mirtazapine  15 mg Oral QHS   Continuous Infusions:

## 2014-10-16 NOTE — H&P (View-Only) (Signed)
Reason for Consult: Right hip fracture Referring Physician:  Doyle Askew, MD  Tasha Ball is an 80 y.o. female.  HPI:  80 yo female with multiple medical co-morbidities and current resident in memory care unit at [facility].  Found to have a painful right lower extremity, no confirmed fall reported.  Brought to [facility] ER where X-rays revealed broken right hip, displaced femoral neck fracture.  Admitted to medicine per fracture protocol, Ortho consulted for management of fracture.  Patient and family known to me from previous left hip hemiarthroplasty 3 years ago.  Past Medical History  Diagnosis Date  . Urinary tract infection   . Cancer     non-hodgkins lymphoma   . Lymphoma in remission     ca free 5 yrs  . Dementia   . Atrial fibrillation   . Hypertension   . Mitral valve prolapse   . Thyroid disease   . Memory loss   . Osteoporosis   . 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 Hip fracture, left 10/16/2011  . Femoral fracture 10/27/2011  . Pelvic fracture 10/08/2013  . HTN (hypertension) 10/20/2011  . DVT (deep venous thrombosis)     Past Surgical History  Procedure Laterality Date  . Eye surgery      bil.cataract with lens implants  . Hip arthroplasty  10/18/2011    Procedure: ARTHROPLASTY BIPOLAR HIP;  Surgeon: Mauri Pole;  Location: [facility] ORS;  Service: Orthopedics;  Laterality: Left;  depuy    Family History  Problem Relation Age of Onset  . Heart disease Father   . Angina Father   . Stomach cancer Brother 79  . Coronary artery disease Brother     Social History:  reports that she has never smoked. She has never used smokeless tobacco. She reports that she does not drink alcohol or use illicit drugs.  Allergies: No Known Allergies  Medications:  I have reviewed the patient's current medications. Scheduled: . donepezil  23 mg Oral QHS  . escitalopram  10 mg Oral Daily  . feeding supplement (ENSURE COMPLETE)  237 mL Oral BID BM  . heparin  5,000 Units Subcutaneous 3 times  per day  . Influenza vac split quadrivalent PF  0.5 mL Intramuscular Tomorrow-1000  . levothyroxine  100 mcg Oral Q breakfast  . losartan  50 mg Oral Daily  . metoprolol tartrate  50 mg Oral BID  . mirtazapine  15 mg Oral QHS    Results for orders placed or performed during the hospital encounter of 10/15/14 (from the past 24 hour(s))  Urinalysis, Routine w reflex microscopic     Status: Abnormal   Collection Time: 10/15/14  9:37 AM  Result Value Ref Range   Color, Urine AMBER (A) YELLOW   APPearance CLEAR CLEAR   Specific Gravity, Urine 1.019 1.005 - 1.030   pH 6.0 5.0 - 8.0   Glucose, UA NEGATIVE NEGATIVE mg/dL   Hgb urine dipstick TRACE (A) NEGATIVE   Bilirubin Urine SMALL (A) NEGATIVE   Ketones, ur 15 (A) NEGATIVE mg/dL   Protein, ur NEGATIVE NEGATIVE mg/dL   Urobilinogen, UA 2.0 (H) 0.0 - 1.0 mg/dL   Nitrite NEGATIVE NEGATIVE   Leukocytes, UA NEGATIVE NEGATIVE  Urine microscopic-add on     Status: None   Collection Time: 10/15/14  9:37 AM  Result Value Ref Range   WBC, UA 0-2 <3 WBC/hpf   RBC / HPF 0-2 <3 RBC/hpf   Urine-Other MUCOUS PRESENT   CBC     Status: None  Collection Time: 10/16/14  5:11 AM  Result Value Ref Range   WBC 7.7 4.0 - 10.5 K/uL   RBC 4.18 3.87 - 5.11 MIL/uL   Hemoglobin 13.3 12.0 - 15.0 g/dL   HCT 39.0 36.0 - 46.0 %   MCV 93.3 78.0 - 100.0 fL   MCH 31.8 26.0 - 34.0 pg   MCHC 34.1 30.0 - 36.0 g/dL   RDW 13.9 11.5 - 15.5 %   Platelets 172 150 - 400 K/uL  Basic metabolic panel     Status: Abnormal   Collection Time: 10/16/14  5:11 AM  Result Value Ref Range   Sodium 135 (L) 137 - 147 mEq/L   Potassium 3.8 3.7 - 5.3 mEq/L   Chloride 91 (L) 96 - 112 mEq/L   CO2 30 19 - 32 mEq/L   Glucose, Bld 82 70 - 99 mg/dL   BUN 18 6 - 23 mg/dL   Creatinine, Ser 1.19 (H) 0.50 - 1.10 mg/dL   Calcium 9.3 8.4 - 10.5 mg/dL   GFR calc non Af Amer 40 (L) >90 mL/min   GFR calc Af Amer 46 (L) >90 mL/min   Anion gap 14 5 - 15    X-ray: CLINICAL DATA: Patient  found in bed with foreshortened right leg with external rotation and pain.  EXAM: RIGHT HIP - COMPLETE 2+ VIEW  COMPARISON: CT pelvis 10/07/2013. Right hip 10/07/2013.  FINDINGS: Acute fracture of the right femoral neck with superior displacement of the distal fracture fragment resulting in varus angulation. No dislocation of the right hip. Old fracture deformities of the right symphysis pubis and left superior and inferior pubic rami. Previous left hip hemiarthroplasty. Degenerative changes in the lower lumbar spine.  IMPRESSION: Acute fracture right femoral neck with varus angulation. Old fracture deformities of right symphysis pubis and left superior and inferior pubic rami.  ROS  Due to dementia, unreliable Thus discussed with her son No recent hospitalizations Recurrent and frequent UTIs related to incontinence   Blood pressure 137/69, pulse 62, temperature 97.6 F (36.4 C), temperature source Oral, resp. rate 16, height 5\' 2"  (1.575 m), weight 47.628 kg (105 lb), SpO2 95 %.  Physical Exam  Relatively comfortable Right lower extremity shortened and externally rotated Left lower extremity without deformity Right leg not moved due to X-rays findings  General medical exam reviewed upon admission She already is demonstrating evidence or concerns for confusion in this changed environment  Assessment/Plan: Displaced right femoral neck fracture  Plan: To OR today 11/17 for right hip hemiarthroplasty Consent ordered and to be signed by son who I reviewed her situation with yesterday NPO Off Xarelto since Sunday  Reviewed post-operative course and expectations, risks and benefits with patient's son  Mauri Pole 10/16/2014, 7:19 AM

## 2014-10-16 NOTE — Transfer of Care (Signed)
Immediate Anesthesia Transfer of Care Note  Patient: Tasha Ball  Procedure(s) Performed: Procedure(s): ARTHROPLASTY BIPOLAR HIP (Right)  Patient Location: PACU  Anesthesia Type:General  Level of Consciousness: awake, alert , oriented and patient cooperative  Airway & Oxygen Therapy: Patient Spontanous Breathing and Patient connected to face mask oxygen  Post-op Assessment: Report given to PACU RN, Post -op Vital signs reviewed and stable and Patient moving all extremities  Post vital signs: Reviewed and stable  Complications: No apparent anesthesia complications

## 2014-10-16 NOTE — Anesthesia Preprocedure Evaluation (Addendum)
Anesthesia Evaluation  Patient identified by MRN, date of birth, ID band Patient awake    Reviewed: Allergy & Precautions, H&P , NPO status , Patient's Chart, lab work & pertinent test results  Airway Mallampati: II  TM Distance: >3 FB Neck ROM: Full   Comment: Cervical spine CT reviewed. Dental no notable dental hx.    Pulmonary neg pulmonary ROS,  breath sounds clear to auscultation  Pulmonary exam normal       Cardiovascular hypertension, Pt. on medications and Pt. on home beta blockers + Peripheral Vascular Disease + dysrhythmias Atrial Fibrillation Rhythm:Regular Rate:Normal  ECG and CXR reviewed.   Neuro/Psych PSYCHIATRIC DISORDERS Dementia    GI/Hepatic negative GI ROS, Neg liver ROS,   Endo/Other  Hypothyroidism   Renal/GU negative Renal ROS  negative genitourinary   Musculoskeletal negative musculoskeletal ROS (+)   Abdominal   Peds negative pediatric ROS (+)  Hematology negative hematology ROS (+)   Anesthesia Other Findings   Reproductive/Obstetrics negative OB ROS                           Anesthesia Physical Anesthesia Plan  ASA: III and emergent  Anesthesia Plan: General   Post-op Pain Management:    Induction: Intravenous  Airway Management Planned: Oral ETT  Additional Equipment:   Intra-op Plan:   Post-operative Plan: Extubation in OR  Informed Consent: I have reviewed the patients History and Physical, chart, labs and discussed the procedure including the risks, benefits and alternatives for the proposed anesthesia with the patient or authorized representative who has indicated his/her understanding and acceptance.   Dental advisory given  Plan Discussed with: CRNA  Anesthesia Plan Comments: (Discussed r/b with patient's son.  Possible venous hypertension or fluid overload on CXR)       Anesthesia Quick Evaluation

## 2014-10-16 NOTE — Op Note (Signed)
NAME:  Tasha Ball                ACCOUNT NO.:  [id number]   MEDICAL RECORD NO.: [id number]   LOCATION:  2542                         FACILITY:  [facility] OF BIRTH:  2024-07-22  PHYSICIAN:  Pietro Cassis. Alvan Dame, M.D.     DATE OF PROCEDURE:  10/16/14                               OPERATIVE REPORT    PREOPERATIVE DIAGNOSIS:  Right displaced femoral neck fracture.   POSTOPERATIVE DIAGNOSIS:  Right displaced femoral neck fracture.   PROCEDURE:  Right hip hemiarthroplasty utilizing DePuy component, size 4 standard Summit [employer] stem with a 46 unipolar ball with a +5 adapter.   SURGEON:  Pietro Cassis. Alvan Dame, MD   ASSISTANT:  Danae Orleans, PA-C.   ANESTHESIA:  General.   SPECIMENS:  None.   DRAINS:  None.   BLOOD LOSS:  About 200 cc.   COMPLICATIONS:  None.   INDICATION OF PROCEDURE:  [name] is a pleasant 80 year old female with multiple medical problems who currently resides in a memory care unit at [facility].  She was found to have increased pain in her right lower extremity with no reported or witnessed falls.  She was admitted to the hospital after radiographs revealed a femoral neck fracture.  She was seen and evaluated and was scheduled for surgery for fixation.  The necessity of surgical repair was discussed with her son.  I had been involved in the care of her other hip when in had a similar fate 3 years ago.  Consent was obtained after reviewing risks of infection, DVT, component failure, and need for revision surgery.   PROCEDURE IN DETAIL:  The patient was brought to the operative theater. Once adequate anesthesia, preoperative antibiotics, 2 g of Ancef administered, the patient was positioned into the left lateral decubitus position with the right side up.  The left lower extremity was then prepped and draped in sterile fashion.  A time-out was performed identifying the patient, planned procedure, and extremity.   A lateral incision was made  off the proximal trochanter. Sharp dissection was carried down to the iliotibial band and gluteal fascia. The gluteal fascia was then incised for posterior approach.  The short external rotators were taken down separate from the posterior capsule. An L capsulotomy was made preserving the posterior leaflet for later anatomic repair. Fracture site was identified and after removing comminuted segments of the posterior femoral neck, the femoral head was removed without difficulty and measured on the back table  using the sizing rings and determined to be 46 mm in diameter.   The proximal femur was then exposed.  Retractors placed.  I then drilled, opened the proximal femur.  Then I hand reamed once and  Irrigated the canal to try to prevent fat emboli.  I began broaching the femur with a starting size 1 broach and then up to a size 4 broach with good medial and lateral metaphyseal fit without evidence of any torsion or movement.  A trial reduction was carried out with a standard neck and a +5 adapter with a 11mm ball.  The hip reduced nicely.  The leg lengths appeared to be equal compared to the  down leg.   The hip went through a range of motion without evidence of any subluxation or impingement.   Given these findings, the trial components removed.  The final 4 standard Summit Basic  Press fit stem was opened.  After irrigating the canal, the final stem was impacted and sat at the level where the broach was. Based on this and the trial reduction, a +5 adapter was opened and impacted in the 72mm unipolar ball onto a clean and dry trunnion.  The hip had been irrigated throughout the case and again at this point.  I re- Approximated the posterior capsule to the superior leaflet using a  #1 Vicryl,  and placed a medium Hemovac drain deep.  The remainder of the wound was closed with #1 Vicryl in the iliotibial band and gluteal fascia, a  2-0 Vicryl in the sub-Q tissue and a running 4-0  Monocryl in the skin.  The hip was cleaned, dried, and dressed sterilely using Dermabond and Aquacel dressing.  Drain site was dressed separately.  She was then brought to recovery room, extubated in stable condition, tolerating the procedure well.  Danae Orleans, PA-C was present and utilized as Environmental consultant for the entire case from  Preoperative positioning to management of the contralateral extremity and retractors to  General facilitation of the procedure.  He was also involved with primary wound closure.         Pietro Cassis Alvan Dame, M.D.

## 2014-10-16 NOTE — Anesthesia Postprocedure Evaluation (Signed)
  Anesthesia Post-op Note  Patient: Tasha Ball  Procedure(s) Performed: Procedure(s) (LRB): ARTHROPLASTY BIPOLAR HIP (Right)  Patient Location: PACU  Anesthesia Type: General  Level of Consciousness: awake and alert   Airway and Oxygen Therapy: Patient Spontanous Breathing  Post-op Pain: mild  Post-op Assessment: Post-op Vital signs reviewed, Patient's Cardiovascular Status Stable, Respiratory Function Stable, Patent Airway and No signs of Nausea or vomiting  Last Vitals:  Filed Vitals:   10/16/14 1855  BP: 152/96  Pulse: 78  Temp: 36.1 C  Resp: 24    Post-op Vital Signs: stable   Complications: No apparent anesthesia complications

## 2014-10-16 NOTE — Discharge Instructions (Signed)
maintain surgical dressing until follow up with Dr. Silvano Bilis for her previous level of activity  Current dressing is water proof so routine bathing permitted  Continue to treat and try and prevent decubitus ulcers

## 2014-10-16 NOTE — Interval H&P Note (Signed)
History and Physical Interval Note:  10/16/2014 3:56 PM  Tasha Ball  has presented today for surgery, with the diagnosis of fractured right hip  The various methods of treatment have been discussed with the patient and family. After consideration of risks, benefits and other options for treatment, the patient has consented to  Procedure(s): ARTHROPLASTY BIPOLAR HIP (Right) as a surgical intervention .  The patient's history has been reviewed, patient examined, no change in status, stable for surgery.  I have reviewed the patient's chart and labs.  Questions were answered to the patient's satisfaction.     Mauri Pole

## 2014-10-16 NOTE — Consult Note (Signed)
Reason for Consult: Right hip fracture Referring Physician:  Doyle Askew, MD  Tasha Ball is an 80 y.o. female.  HPI:  80 yo female with multiple medical co-morbidities and current resident in memory care unit at [facility].  Found to have a painful right lower extremity, no confirmed fall reported.  Brought to [facility] ER where X-rays revealed broken right hip, displaced femoral neck fracture.  Admitted to medicine per fracture protocol, Ortho consulted for management of fracture.  Patient and family known to me from previous left hip hemiarthroplasty 3 years ago.  Past Medical History  Diagnosis Date  . Urinary tract infection   . Cancer     non-hodgkins lymphoma   . Lymphoma in remission     ca free 5 yrs  . Dementia   . Atrial fibrillation   . Hypertension   . Mitral valve prolapse   . Thyroid disease   . Memory loss   . Osteoporosis   . 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 Hip fracture, left 10/16/2011  . Femoral fracture 10/27/2011  . Pelvic fracture 10/08/2013  . HTN (hypertension) 10/20/2011  . DVT (deep venous thrombosis)     Past Surgical History  Procedure Laterality Date  . Eye surgery      bil.cataract with lens implants  . Hip arthroplasty  10/18/2011    Procedure: ARTHROPLASTY BIPOLAR HIP;  Surgeon: Mauri Pole;  Location: [facility];  Service: Orthopedics;  Laterality: Left;  depuy    Family History  Problem Relation Age of Onset  . Heart disease Father   . Angina Father   . Stomach cancer Brother 46  . Coronary artery disease Brother     Social History:  reports that she has never smoked. She has never used smokeless tobacco. She reports that she does not drink alcohol or use illicit drugs.  Allergies: No Known Allergies  Medications:  I have reviewed the patient's current medications. Scheduled: . donepezil  23 mg Oral QHS  . escitalopram  10 mg Oral Daily  . feeding supplement (ENSURE COMPLETE)  237 mL Oral BID BM  . heparin  5,000 Units Subcutaneous 3 times  per day  . Influenza vac split quadrivalent PF  0.5 mL Intramuscular Tomorrow-1000  . levothyroxine  100 mcg Oral Q breakfast  . losartan  50 mg Oral Daily  . metoprolol tartrate  50 mg Oral BID  . mirtazapine  15 mg Oral QHS    Results for orders placed or performed during the hospital encounter of 10/15/14 (from the past 24 hour(s))  Urinalysis, Routine w reflex microscopic     Status: Abnormal   Collection Time: 10/15/14  9:37 AM  Result Value Ref Range   Color, Urine AMBER (A) YELLOW   APPearance CLEAR CLEAR   Specific Gravity, Urine 1.019 1.005 - 1.030   pH 6.0 5.0 - 8.0   Glucose, UA NEGATIVE NEGATIVE mg/dL   Hgb urine dipstick TRACE (A) NEGATIVE   Bilirubin Urine SMALL (A) NEGATIVE   Ketones, ur 15 (A) NEGATIVE mg/dL   Protein, ur NEGATIVE NEGATIVE mg/dL   Urobilinogen, UA 2.0 (H) 0.0 - 1.0 mg/dL   Nitrite NEGATIVE NEGATIVE   Leukocytes, UA NEGATIVE NEGATIVE  Urine microscopic-add on     Status: None   Collection Time: 10/15/14  9:37 AM  Result Value Ref Range   WBC, UA 0-2 <3 WBC/hpf   RBC / HPF 0-2 <3 RBC/hpf   Urine-Other MUCOUS PRESENT   CBC     Status: None  Collection Time: 10/16/14  5:11 AM  Result Value Ref Range   WBC 7.7 4.0 - 10.5 K/uL   RBC 4.18 3.87 - 5.11 MIL/uL   Hemoglobin 13.3 12.0 - 15.0 g/dL   HCT 39.0 36.0 - 46.0 %   MCV 93.3 78.0 - 100.0 fL   MCH 31.8 26.0 - 34.0 pg   MCHC 34.1 30.0 - 36.0 g/dL   RDW 13.9 11.5 - 15.5 %   Platelets 172 150 - 400 K/uL  Basic metabolic panel     Status: Abnormal   Collection Time: 10/16/14  5:11 AM  Result Value Ref Range   Sodium 135 (L) 137 - 147 mEq/L   Potassium 3.8 3.7 - 5.3 mEq/L   Chloride 91 (L) 96 - 112 mEq/L   CO2 30 19 - 32 mEq/L   Glucose, Bld 82 70 - 99 mg/dL   BUN 18 6 - 23 mg/dL   Creatinine, Ser 1.19 (H) 0.50 - 1.10 mg/dL   Calcium 9.3 8.4 - 10.5 mg/dL   GFR calc non Af Amer 40 (L) >90 mL/min   GFR calc Af Amer 46 (L) >90 mL/min   Anion gap 14 5 - 15    X-ray: CLINICAL DATA: Patient  found in bed with foreshortened right leg with external rotation and pain.  EXAM: RIGHT HIP - COMPLETE 2+ VIEW  COMPARISON: CT pelvis 10/07/2013. Right hip 10/07/2013.  FINDINGS: Acute fracture of the right femoral neck with superior displacement of the distal fracture fragment resulting in varus angulation. No dislocation of the right hip. Old fracture deformities of the right symphysis pubis and left superior and inferior pubic rami. Previous left hip hemiarthroplasty. Degenerative changes in the lower lumbar spine.  IMPRESSION: Acute fracture right femoral neck with varus angulation. Old fracture deformities of right symphysis pubis and left superior and inferior pubic rami.  ROS  Due to dementia, unreliable Thus discussed with her son No recent hospitalizations Recurrent and frequent UTIs related to incontinence   Blood pressure 137/69, pulse 62, temperature 97.6 F (36.4 C), temperature source Oral, resp. rate 16, height 5\' 2"  (1.575 m), weight 47.628 kg (105 lb), SpO2 95 %.  Physical Exam  Relatively comfortable Right lower extremity shortened and externally rotated Left lower extremity without deformity Right leg not moved due to X-rays findings  General medical exam reviewed upon admission She already is demonstrating evidence or concerns for confusion in this changed environment  Assessment/Plan: Displaced right femoral neck fracture  Plan: To OR today 11/17 for right hip hemiarthroplasty Consent ordered and to be signed by son who I reviewed her situation with yesterday NPO Off Xarelto since Sunday  Reviewed post-operative course and expectations, risks and benefits with patient's son  Mauri Pole 10/16/2014, 7:19 AM

## 2014-10-16 NOTE — Progress Notes (Signed)
Received post surgery s/p post Right hemiarthroplasty, incision site dressing dry and intact.

## 2014-10-17 ENCOUNTER — Encounter (HOSPITAL_COMMUNITY): Payer: Self-pay | Admitting: Orthopedic Surgery

## 2014-10-17 LAB — BASIC METABOLIC PANEL
Anion gap: 12 (ref 5–15)
BUN: 20 mg/dL (ref 6–23)
CO2: 29 mEq/L (ref 19–32)
CREATININE: 1.13 mg/dL — AB (ref 0.50–1.10)
Calcium: 8.6 mg/dL (ref 8.4–10.5)
Chloride: 90 mEq/L — ABNORMAL LOW (ref 96–112)
GFR, EST AFRICAN AMERICAN: 49 mL/min — AB (ref >90)
GFR, EST NON AFRICAN AMERICAN: 42 mL/min — AB (ref >90)
Glucose, Bld: 79 mg/dL (ref 70–99)
POTASSIUM: 3.5 meq/L — AB (ref 3.7–5.3)
Sodium: 131 mEq/L — ABNORMAL LOW (ref 137–147)

## 2014-10-17 LAB — CBC
HEMATOCRIT: 31.7 % — AB (ref 36.0–46.0)
Hemoglobin: 10.5 g/dL — ABNORMAL LOW (ref 12.0–15.0)
MCH: 31.6 pg (ref 26.0–34.0)
MCHC: 33.1 g/dL (ref 30.0–36.0)
MCV: 95.5 fL (ref 78.0–100.0)
Platelets: 189 10*3/uL (ref 150–400)
RBC: 3.32 MIL/uL — ABNORMAL LOW (ref 3.87–5.11)
RDW: 13.9 % (ref 11.5–15.5)
WBC: 8.5 10*3/uL (ref 4.0–10.5)

## 2014-10-17 MED ORDER — METHOCARBAMOL 500 MG PO TABS
500.0000 mg | ORAL_TABLET | Freq: Three times a day (TID) | ORAL | Status: DC | PRN
  Administered 2014-10-17: 500 mg via ORAL
  Filled 2014-10-17: qty 1

## 2014-10-17 MED ORDER — POTASSIUM CHLORIDE CRYS ER 20 MEQ PO TBCR
40.0000 meq | EXTENDED_RELEASE_TABLET | Freq: Once | ORAL | Status: AC
  Administered 2014-10-17: 40 meq via ORAL
  Filled 2014-10-17: qty 2

## 2014-10-17 MED ORDER — SODIUM CHLORIDE 0.9 % IV BOLUS (SEPSIS): 500.0000 mL | Freq: Once | INTRAVENOUS | Status: DC

## 2014-10-17 MED ORDER — SODIUM CHLORIDE 0.9 % IV BOLUS (SEPSIS): 250.0000 mL | Freq: Once | INTRAVENOUS | Status: DC

## 2014-10-17 NOTE — Progress Notes (Signed)
IN to turn and reposition, noticed pt has leakage around cath.  Will continue to monitor output. SRP, RN

## 2014-10-17 NOTE — Plan of Care (Signed)
Problem: Phase I Progression Outcomes Goal: Pre op pain controlled with appropriate interventions Outcome: Not Applicable Date Met:  66/44/03 Goal: Pre op Protime within normal limits Outcome: Not Applicable Date Met:  47/42/59 Goal: Post op pain controlled with appropriate interventions Outcome: Completed/Met Date Met:  10/17/14 Goal: Post op hemodynamically stable Outcome: Completed/Met Date Met:  10/17/14 Goal: Post op CMS/Neurovascular status WDL Outcome: Completed/Met Date Met:  10/17/14 Goal: Post op clear liquids, advance diet as tolerated Outcome: Completed/Met Date Met:  10/17/14 Goal: Other Phase I Outcomes/Goals Outcome: Not Applicable Date Met:  56/38/75  Problem: Phase II Progression Outcomes Goal: CMS/Neurovascular status WDL Outcome: Progressing Goal: Tolerating diet Outcome: Completed/Met Date Met:  10/17/14 Goal: Bed to chair Outcome: Progressing

## 2014-10-17 NOTE — Evaluation (Signed)
Physical Therapy Evaluation Patient Details Name: Tasha Ball MRN: [id number] DOB: [birth year] Today's Date: 10/17/2014   History of Present Illness  80 yo female adm 10/15/14 with R hip hip pain, no witnessed falls or other incident at [facility], pt from [facility]; Pt is s/p right hip posterior hemiarthroplasty per Paralee Cancel, MD on 10/16/14; PMHx: dementia, afib   Clinical Impression  Pt will benefit from PT to address deficits below,  Pt limited by pain, anxiety, and cognition; will continue to follow; Will likely need SNF post acute    Follow Up Recommendations SNF;Supervision/Assistance - 24 hour    Equipment Recommendations  None recommended by PT    Recommendations for Other Services       Precautions / Restrictions Precautions Precautions: Fall Restrictions RLE Weight Bearing: Weight bearing as tolerated      Mobility  Bed Mobility Overal bed mobility: Needs Assistance;+2 for physical assistance Bed Mobility: Supine to Sit;Sit to Supine     Supine to sit: +2 for physical assistance;Max assist Sit to supine: Max assist;+2 for physical assistance   General bed mobility comments: +2 for trunk and LEs  Transfers                 General transfer comment: not tested today, pt very fearful of falling, anxious with sitting EOB  Ambulation/Gait                Stairs            Wheelchair Mobility    Modified Rankin (Stroke Patients Only)       Balance Overall balance assessment: History of Falls;Needs assistance Sitting-balance support: Bilateral upper extremity supported;Feet supported Sitting balance-Leahy Scale: Zero Sitting balance - Comments: pt sat EOB x 12 min requiring min to max to +2 assist to maintain static sit  Postural control: Posterior lean                                   Pertinent Vitals/Pain Pain Assessment: Faces Faces Pain Scale: Hurts whole lot Pain Intervention(s): Limited activity  within patient's tolerance    Home Living                   Additional Comments: pt was in [place] at [facility], it appears she was ambulatory but no family present to confirm    Prior Function                 Hand Dominance        Extremity/Trunk Assessment   Upper Extremity Assessment: Defer to OT evaluation;Overall [facility] for tasks assessed           Lower Extremity Assessment: RLE deficits/detail         Communication   Communication: HOH  Cognition Arousal/Alertness: Awake/alert Behavior During Therapy: Restless;Anxious Overall Cognitive Status: History of cognitive impairments - at baseline                      General Comments      Exercises        Assessment/Plan    PT Assessment Patient needs continued PT services  PT Diagnosis Generalized weakness;Acute pain   PT Problem List Decreased activity tolerance;Decreased balance;Decreased mobility;Decreased strength;Decreased range of motion  PT Treatment Interventions DME instruction;Gait training;Functional mobility training;Therapeutic activities;Therapeutic exercise;Patient/family education;Balance training   PT Goals (Current goals can be found in the Care Plan  section) Acute Rehab PT Goals Patient Stated Goal: pt unable to state PT Goal Formulation: Patient unable to participate in goal setting Time For Goal Achievement: 10/31/14 Potential to Achieve Goals: Good    Frequency Min 2X/week   Barriers to discharge        Co-evaluation               End of Session   Activity Tolerance: Patient limited by fatigue Patient left: in bed;with call bell/phone within reach Nurse Communication: Mobility status         Time: 1833-5825 PT Time Calculation (min) (ACUTE ONLY): 24 min   Charges:   PT Evaluation $Initial PT Evaluation Tier I: 1 Procedure PT Treatments $Therapeutic Activity: 23-37 mins   PT G Codes:          Antione Obar Oct 19, 2014, 3:32  PM

## 2014-10-17 NOTE — Progress Notes (Signed)
CSW provided SNF bed offers to patient who accepted bed @ [place]. [facility] @ [place] aware.   Clinical Social Work Department CLINICAL SOCIAL WORK PLACEMENT NOTE 10/17/2014  Patient:  Tasha Ball, Tasha Ball  Account Number:  [id number] Admit date:  10/15/2014  Clinical Social Worker:  Renold Genta  Date/time:  10/17/2014 11:46 AM  Clinical Social Work is seeking post-discharge placement for this patient at the following level of care:   SKILLED NURSING   (*CSW will update this form in Epic as items are completed)   10/17/2014  Patient/family provided with [facility] Department of Clinical Social Work's list of facilities offering this level of care within the geographic area requested by the patient (or if unable, by the patient's family).  10/17/2014  Patient/family informed of their freedom to choose among providers that offer the needed level of care, that participate in Medicare, Medicaid or managed care program needed by the patient, have an available bed and are willing to accept the patient.  10/17/2014  Patient/family informed of [facility]' ownership interest in [facility], as well as of the fact that they are under no obligation to receive care at this facility.  PASARR submitted to EDS on 10/17/2014 PASARR number received on 10/17/2014  FL2 transmitted to all facilities in geographic area requested by pt/family on  10/17/2014 FL2 transmitted to all facilities within larger geographic area on   Patient informed that his/her managed care company has contracts with or will negotiate with  certain facilities, including the following:     Patient/family informed of bed offers received:  10/17/2014 Patient chooses bed at [place] Physician recommends and patient chooses bed at    Patient to be transferred to [place] on   Patient to be transferred to facility by  Patient and family notified of transfer on  Name of family member notified:    The following  physician request were entered in Epic:   Additional Comments:   Raynaldo Opitz, [place] Social Worker cell #: [phone]

## 2014-10-17 NOTE — Progress Notes (Signed)
Decreased urine out put noted, 140 cc to current status. MD notified--250 cc bolus given. SRP, RN

## 2014-10-17 NOTE — Progress Notes (Addendum)
Patient ID: Tasha Ball, female   DOB: 1924/07/04, 80 y.o.   MRN: [id number] [facility] PROGRESS NOTE  Tasha Ball GGY:[id number] DOB: 1924-10-24 DOA: 10/15/2014 PCP: Jerlyn Ly, MD   Brief narrative: 80 year old female with past medical history of dementia, hypothyroidism, hypertension, DVT on anticoagulation with xarelto who presented to [facility] ED11/16/2015 from SNF with right hip pain. There was no witnessed fall, patient was found in her bed. Patient is not good historian due to history of dementia. No reports of respiratory distress. No reports of fevers. No reports of chest pain. No diarrhea or constipation. No vomiting.   In ED, BP is 172/95 and then low at 95/65, HR 66, T max 97.3 F and oxygen saturation 94% on room air. CXR showed questionable venous hypertension/ mild fluid overload but no consolidation or collapse. CT head and cervical spine showed no acute intracranial findings. Right hip x ray showed acute fracture of the right femoral neck with varus angulation. Per Dr. Alvan Dame of orthopedic surgery, plan for surgery tomorrow since pt on anticoagulation.   Assessment and Plan:   Principal Problem:  Fracture of femoral neck, right - unclear if there was a fall or not; fracture confirmed on right hip x ray  - status post right hip hemiarthroplasty, post op day #1 - PT evaluation once pt able to participate  - pain management with morphine 0.5 mg every 2 hours IV PRN severe pain Active Problems:  Acute renal insufficiency - likely pre renal from poor oral intake - Cr still mild elevated, IVF and repeat BMP in AM - continue to hold Losartan    Acute blood loss anemia  - post op related - no signs of active bleeding - repeat CBC in AM   Hypokalemia - mild, will supplement and repeat BMP in AM  Atrial fibrillation, rate controlled - in sinus rhythm this AM - on anticoagulation with xarelto - hold metoprolol due to soft BP  HTN (hypertension), essential  - BP  meds on hold due to hypotension - if BP improves, may resume metoprolol  - hold losartan until renal function stabilizes   Hypothyroidism - resume synthroid   Dementia without behavioral disturbance / depression - continue aricept, namenda, lexapro   DVT of upper extremity (deep vein thrombosis) - resume AC if no further drop in Hg   Protein-calorie malnutrition, severe - nutrition consulted, advance diet as pt able to tolerate   DVT prophylaxis: now on Heparin SQ pre op, resume Xarelto post op  Code Status: DNR/DNI Family Communication: Son at the bedside  Disposition Plan: Remains inpatient   IV Access:    Peripheral IV Procedures and diagnostic studies:    CXR 10/15/2014 Question venous hypertension/ mild fluid overload. No focal consolidation or collapse.   Dg Hip Complete Right 10/15/2014 Acute fracture right femoral neck with varus angulation. Old fracture deformities of right symphysis pubis and left superior and inferior pubic rami.   Ct Head Wo Contrast 10/15/2014 1. No acute intracranial abnormalities. 2. Chronic small vessel ischemic disease and brain atrophy. 3. Severe multi level cervical spondylosis. No evidence for cervical spine fracture.   Ct Cervical Spine Wo Contrast 10/15/2014 1. No acute intracranial abnormalities. 2. Chronic small vessel ischemic disease and brain atrophy. 3. Severe multi level cervical spondylosis. No evidence for cervical spine fracture.   Right hip arthroplasty 10/16/2014 by Dr. Alvan Dame  Medical Consultants:    Ortho  Other Consultants:    Physical therapy  Anti-Infectives:  None  Faye Ramsay, MD  [facility] Pager [phone]  If 7PM-7AM, please contact night-coverage [email or web address] Password [email or web address] 10/17/2014, 6:59 AM   LOS: 2 days   HPI/Subjective: No events overnight.   Objective: Filed Vitals:   10/16/14 1845 10/16/14 1855 10/16/14 2258 10/17/14 0141  BP: 151/88 152/96 111/64 110/57   Pulse: 74 78 71 70  Temp: 97 F (36.1 C) 97 F (36.1 C) 98.1 F (36.7 C) 97.6 F (36.4 C)  TempSrc:   Oral Oral  Resp: 26 24 20 20   Height:      Weight:      SpO2: 99% 93% 99% 95%    Intake/Output Summary (Last 24 hours) at 10/17/14 0659 Last data filed at 10/17/14 0100  Gross per 24 hour  Intake    960 ml  Output    315 ml  Net    645 ml    Exam:   General:  Pt is alert, follows commands appropriately, not in acute distress  Cardiovascular: Regular rate and rhythm, no rubs, no gallops  Respiratory: Clear to auscultation bilaterally, no wheezing, no crackles, no rhonchi  Abdomen: Soft, non tender, non distended, bowel sounds present, no guarding  Data Reviewed: Basic Metabolic Panel:  Recent Labs Lab 10/15/14 0700 10/16/14 0511 10/17/14 0410  NA 134* 135* 131*  K 3.9 3.8 3.5*  CL 94* 91* 90*  CO2 28 30 29   GLUCOSE 95 82 79  BUN 11 18 20   CREATININE 0.65 1.19* 1.13*  CALCIUM 9.4 9.3 8.6   CBC:  Recent Labs Lab 10/15/14 0700 10/16/14 0511 10/17/14 0410  WBC 10.0 7.7 8.5  NEUTROABS 7.3  --   --   HGB 12.1 13.3 10.5*  HCT 35.8* 39.0 31.7*  MCV 92.7 93.3 95.5  PLT 214 172 189   Cardiac Enzymes:  Recent Labs Lab 10/15/14 0700  TROPONINI <0.30    Recent Results (from the past 240 hour(s))  Surgical pcr screen     Status: None   Collection Time: 10/16/14  8:55 AM  Result Value Ref Range Status   MRSA, PCR NEGATIVE NEGATIVE Final   Staphylococcus aureus NEGATIVE NEGATIVE Final    Comment:        The Xpert SA Assay (FDA approved for NASAL specimens in patients over 17 years of age), is one component of a comprehensive surveillance program.  Test performance has been validated by [employer] for patients greater than or equal to 36 year old. It is not intended to diagnose infection nor to guide or monitor treatment.      Scheduled Meds: .  ceFAZolin (ANCEF) IV  1 g Intravenous Q6H  . docusate sodium  100 mg Oral BID  . donepezil   23 mg Oral QHS  . enoxaparin (LOVENOX) injection  30 mg Subcutaneous Q24H  . escitalopram  10 mg Oral Daily  . feeding supplement (ENSURE COMPLETE)  237 mL Oral BID BM  . levothyroxine  100 mcg Oral Q breakfast  . losartan  50 mg Oral Daily  . metoprolol tartrate  50 mg Oral BID  . mirtazapine  15 mg Oral QHS   Continuous Infusions: . sodium chloride 50 mL/hr at 10/16/14 2232

## 2014-10-17 NOTE — Progress Notes (Signed)
     Subjective: 1 Day Post-Op Procedure(s) (LRB): ARTHROPLASTY BIPOLAR HIP (Right)   Patient expresses minimal pain, resting comfortably. No events throughout the night.  Objective:   VITALS:   Filed Vitals:   10/17/14  BP: 108/84  Pulse: 70  Temp: 98 F (36.7 C)  Resp: 20    Incision: dressing C/D/I No cellulitis present Compartment soft  LABS  Recent Labs  10/15/14 0700 10/16/14 0511 10/17/14 0410  HGB 12.1 13.3 10.5*  HCT 35.8* 39.0 31.7*  WBC 10.0 7.7 8.5  PLT 214 172 189     Recent Labs  10/15/14 0700 10/16/14 0511 10/17/14 0410  NA 134* 135* 131*  K 3.9 3.8 3.5*  BUN 11 18 20   CREATININE 0.65 1.19* 1.13*  GLUCOSE 95 82 79     Assessment/Plan: 1 Day Post-Op Procedure(s) (LRB): ARTHROPLASTY BIPOLAR HIP (Right) Up with therapy Discharge to SNF when ready medically.   Ortho recommendations:  Resume Xarelto for anticoagulation  APAP for pain management  Iron 325 mg tid for 2-3 weeks   WBAT on the right leg.  Dressing to remain in place until follow in clinic in 2 weeks.  Dressing is waterproof and may shower with it in place.  Follow up in 2 weeks at [facility] Follow up with OLIN,Skyelyn Scruggs D in 2 weeks.  Contact information:  [facility] [address], [address] [place] [place] Eudell Julian   PAC  10/17/2014, 7:52 AM

## 2014-10-18 LAB — CBC
HEMATOCRIT: 30.5 % — AB (ref 36.0–46.0)
HEMOGLOBIN: 10 g/dL — AB (ref 12.0–15.0)
MCH: 31.3 pg (ref 26.0–34.0)
MCHC: 32.8 g/dL (ref 30.0–36.0)
MCV: 95.6 fL (ref 78.0–100.0)
Platelets: 198 10*3/uL (ref 150–400)
RBC: 3.19 MIL/uL — AB (ref 3.87–5.11)
RDW: 14.1 % (ref 11.5–15.5)
WBC: 10.2 10*3/uL (ref 4.0–10.5)

## 2014-10-18 LAB — BASIC METABOLIC PANEL
Anion gap: 8 (ref 5–15)
BUN: 23 mg/dL (ref 6–23)
CO2: 29 meq/L (ref 19–32)
Calcium: 8.5 mg/dL (ref 8.4–10.5)
Chloride: 95 mEq/L — ABNORMAL LOW (ref 96–112)
Creatinine, Ser: 1.19 mg/dL — ABNORMAL HIGH (ref 0.50–1.10)
GFR calc Af Amer: 46 mL/min — ABNORMAL LOW (ref >90)
GFR, EST NON AFRICAN AMERICAN: 40 mL/min — AB (ref >90)
GLUCOSE: 103 mg/dL — AB (ref 70–99)
Potassium: 4.5 mEq/L (ref 3.7–5.3)
Sodium: 132 mEq/L — ABNORMAL LOW (ref 137–147)

## 2014-10-18 MED ORDER — METHOCARBAMOL 500 MG PO TABS: 500.0000 mg | ORAL_TABLET | Freq: Three times a day (TID) | ORAL | Status: AC | PRN

## 2014-10-18 MED ORDER — TRAMADOL HCL 50 MG PO TABS: 50.0000 mg | ORAL_TABLET | Freq: Four times a day (QID) | ORAL | Status: AC | PRN

## 2014-10-18 MED ORDER — METHOCARBAMOL 500 MG PO TABS: 500.0000 mg | ORAL_TABLET | Freq: Three times a day (TID) | ORAL | Status: DC | PRN

## 2014-10-18 MED ORDER — ENOXAPARIN SODIUM 100 MG/ML ~~LOC~~ SOLN: 50.0000 mg | SUBCUTANEOUS | Status: AC

## 2014-10-18 MED ORDER — HYDROCODONE-ACETAMINOPHEN 5-325 MG PO TABS: 1.0000 | ORAL_TABLET | Freq: Four times a day (QID) | ORAL | Status: AC | PRN

## 2014-10-18 NOTE — Evaluation (Signed)
Occupational Therapy Evaluation Patient Details Name: Tasha Ball MRN: [id number] DOB: [birth year] Today's Date: 10/18/2014    History of Present Illness 80 yo female adm 10/15/14 with R hip hip pain, no witnessed falls or other incident at [facility], pt from [employer]; Pt is s/p right hip posterior hemiarthroplasty per Paralee Cancel, MD on 10/16/14; PMHx: dementia, afib    Clinical Impression   Pt is from memory care and just underwent the above surgery.  She will benefit from skilled OT to concentrating on toilet transfers and standing for adls to decrease the burden of care on caregivers.  Uncertain of PLOF--pt likely near baseline for UB adls.      Follow Up Recommendations  SNF    Equipment Recommendations   (to be further assessed)    Recommendations for Other Services       Precautions / Restrictions Precautions Precautions: Fall;Posterior Hip Restrictions Weight Bearing Restrictions: Yes RLE Weight Bearing: Weight bearing as tolerated      Mobility Bed Mobility               General bed mobility comments: not tested  Transfers                 General transfer comment: not tested    Balance                                            ADL Overall ADL's : Needs assistance/impaired Eating/Feeding:  (eating very little; did not assist with cup when thirsty)   Grooming: Wash/dry hands;Wash/dry face;Oral care;Bed level;Minimal assistance   Upper Body Bathing: Moderate assistance;Sitting                             General ADL Comments: performed UB adls.  Pt needs multimodal cues for sequencing, thoroughness.  Pt internally distracted.  Pt needs A x 2 to roll (with pillow between legs).  Total A x 2 for LB adls.  Did not attempt transfer:  needed A x 2 to sit eob yesterday with PT     Vision                     Perception     Praxis      Pertinent Vitals/Pain Pain Assessment: Faces Pain Score:  0-No pain     Hand Dominance     Extremity/Trunk Assessment Upper Extremity Assessment Upper Extremity Assessment: Overall WFL for tasks assessed           Communication Communication Communication: HOH   Cognition Arousal/Alertness: Awake/alert Behavior During Therapy: WFL for tasks assessed/performed Overall Cognitive Status: History of cognitive impairments - at baseline (from memory care)                     General Comments       Exercises       Shoulder Instructions      Home Living                                   Additional Comments: from memory care; will likely need SNF      Prior Functioning/Environment          Comments: unsure of amount of ADL assistance  OT Diagnosis: Generalized weakness;Acute pain   OT Problem List: Decreased strength;Decreased activity tolerance;Impaired balance (sitting and/or standing);Decreased cognition;Decreased safety awareness;Decreased knowledge of precautions;Pain   OT Treatment/Interventions: Self-care/ADL training;DME and/or AE instruction;Balance training;Therapeutic activities;Cognitive remediation/compensation;Patient/family education    OT Goals(Current goals can be found in the care plan section) Acute Rehab OT Goals OT Goal Formulation: With patient Time For Goal Achievement: 10/25/14 Potential to Achieve Goals: Fair ADL Goals Pt Will Transfer to Toilet: with +2 assist;bedside commode;stand pivot transfer;with max assist Additional ADL Goal #1: pt will go from sit to stand with mod A x 2 for safety and maintain for 2 minutes with mod A for adls  OT Frequency: Min 2X/week   Barriers to D/C:            Co-evaluation              End of Session    Activity Tolerance: Patient tolerated treatment well Patient left: in bed;with call bell/phone within reach;with bed alarm set   Time: 4315-4008 OT Time Calculation (min): 16 min Charges:  OT General Charges $OT Visit: 1  Procedure OT Evaluation $Initial OT Evaluation Tier I: 1 Procedure OT Treatments $Self Care/Home Management : 8-22 mins G-Codes:    Brynlie Daza 21-Oct-2014, 10:51 AM   Lesle Chris, OTR/L [phone] 10/21/2014

## 2014-10-18 NOTE — Progress Notes (Signed)
Patient is set to discharge to [facility] today. Patient & son, [name] aware. [facility] Medicare authorization obtained. Discharge packet given to RN, Shirlee Limerick. [facility] called for transport.   Clinical Social Work Department CLINICAL SOCIAL WORK PLACEMENT NOTE 10/18/2014  Patient:  WILLENA, JEANCHARLES  Account Number:  [id number] Admit date:  10/15/2014  Clinical Social Worker:  Renold Genta  Date/time:  10/17/2014 11:46 AM  Clinical Social Work is seeking post-discharge placement for this patient at the following level of care:   SKILLED NURSING   (*CSW will update this form in Epic as items are completed)   10/17/2014  Patient/family provided with [place] Department of Clinical Social Work's list of facilities offering this level of care within the geographic area requested by the patient (or if unable, by the patient's family).  10/17/2014  Patient/family informed of their freedom to choose among providers that offer the needed level of care, that participate in Medicare, Medicaid or managed care program needed by the patient, have an available bed and are willing to accept the patient.  10/17/2014  Patient/family informed of [facility]' ownership interest in [facility], as well as of the fact that they are under no obligation to receive care at this facility.  PASARR submitted to EDS on 10/17/2014 PASARR number received on 10/17/2014  FL2 transmitted to all facilities in geographic area requested by pt/family on  10/17/2014 FL2 transmitted to all facilities within larger geographic area on   Patient informed that his/her managed care company has contracts with or will negotiate with  certain facilities, including the following:     Patient/family informed of bed offers received:  10/17/2014 Patient chooses bed at [place] Physician recommends and patient chooses bed at    Patient to be transferred to [place] on  10/18/2014 Patient to be transferred to  facility by [facility] Patient and family notified of transfer on 10/18/2014 Name of family member notified:  patient's son, [name]  The following physician request were entered in Epic:   Additional Comments:   Raynaldo Opitz, [place] Social Worker cell #: [phone]

## 2014-10-18 NOTE — Progress Notes (Signed)
Physical Therapy Treatment Patient Details Name: Tasha Ball MRN: [id number] DOB: Mar 18, 1924 Today's Date: 10/18/2014    History of Present Illness 81 yo female adm 10/15/14 with R hip hip pain, no witnessed falls or other incident at [facility], pt from [employer]; Pt is s/p right hip posterior hemiarthroplasty per Paralee Cancel, MD on 10/16/14; PMHx: dementia, afib     PT Comments    Pt progressing slowly,  She is able to follow one step functional commands intermittently with multi-modal cues; she is more participatory with bed mobility but still requiring +2 assist; standing not attempted due to posterior bias in sitting, pt is very fearful/anxious  Follow Up Recommendations  SNF;Supervision/Assistance - 24 hour     Equipment Recommendations  None recommended by PT    Recommendations for Other Services       Precautions / Restrictions Precautions Precautions: Fall;Posterior Hip Restrictions RLE Weight Bearing: Weight bearing as tolerated    Mobility  Bed Mobility Overal bed mobility: Needs Assistance;+2 for physical assistance Bed Mobility: Supine to Sit;Sit to Supine;Rolling Rolling: Mod assist (to pt right)   Supine to sit: +2 for physical assistance;Max assist Sit to supine: +2 for physical assistance;Mod assist;Max assist   General bed mobility comments: pt requires +2 for trunk, LEs, safety  Transfers                 General transfer comment: not tested  Ambulation/Gait                 Stairs            Wheelchair Mobility    Modified Rankin (Stroke Patients Only)       Balance Overall balance assessment: History of Falls;Needs assistance Sitting-balance support: Feet supported;Feet unsupported;Single extremity supported;Bilateral upper extremity supported Sitting balance-Leahy Scale: Zero Sitting balance - Comments: pt sat EOB x 11 min requiring min to max to +2 assist to maintain static sit; seh is variable but able to  support her own wt at times Postural control: Posterior lean     Standing balance comment: not tested due to pt posterior bias in sitting                    Cognition Arousal/Alertness: Awake/alert Behavior During Therapy: Anxious Overall Cognitive Status: History of cognitive impairments - at baseline       Memory: Decreased short-term memory;Decreased recall of precautions              Exercises      General Comments        Pertinent Vitals/Pain Pain Assessment: Faces Pain Score: 0-No pain Faces Pain Scale: Hurts little more Pain Location: right hip Pain Intervention(s): Monitored during session;Repositioned    Home Living                 Additional Comments: from memory care; will likely need SNF    Prior Function        Comments: unsure of amount of ADL assistance   PT Goals (current goals can now be found in the care plan section) Acute Rehab PT Goals Patient Stated Goal: pt unable to state PT Goal Formulation: Patient unable to participate in goal setting Time For Goal Achievement: 10/31/14 Potential to Achieve Goals: Good Progress towards PT goals: Progressing toward goals (slowly)    Frequency  Min 2X/week    PT Plan Current plan remains appropriate    Co-evaluation  End of Session   Activity Tolerance: Other (comment) (limited by cognition ) Patient left: in bed;with call bell/phone within reach;with bed alarm set     Time: 1221-1240 PT Time Calculation (min) (ACUTE ONLY): 19 min  Charges:  $Therapeutic Activity: 8-22 mins                    G Codes:      Hennesy Sobalvarro 10-24-2014, 1:10 PM

## 2014-10-18 NOTE — Discharge Summary (Addendum)
Physician Discharge Summary  SHAM ALVIAR ZMO:[id number] DOB: 06-11-1924 DOA: 10/15/2014  PCP: Reymundo Poll  Admit date: 10/15/2014 Discharge date: 10/18/2014  Recommendations for Outpatient Follow-up:  1. Pt will need to follow up with PCP in 2 weeks post discharge 2. Please obtain BMP to evaluate electrolytes and kidney function 3. Please also check CBC to evaluate Hg and Hct levels 4. Please note that Xarelto was stopped, and patient placed on Lovenox 50 mg injection daily 5. Will defer to primary care physician to transition to Xarelto when indicated 6. Please also note that losartan was stopped due to renal insufficiency and hypertension, can be resumed once indicated and once renal function stabilized 7. Please also note that metoprolol was held due to hypertension, this medication can also be resumed once blood pressure stabilizes  Discharge Diagnoses:  Principal Problem:   Fracture of femoral neck, right Active Problems:   Atrial fibrillation   HTN (hypertension)   Hypothyroidism   Dementia without behavioral disturbance   DVT of upper extremity (deep vein thrombosis)   Protein-calorie malnutrition, severe   Discharge Condition: Stable  Diet recommendation: Heart healthy diet discussed in details   Brief narrative: 80 year old female with past medical history of dementia, hypothyroidism, hypertension, DVT on anticoagulation with xarelto who presented to [facility] ED11/16/[facility] from SNF with right hip pain. There was no witnessed fall, patient was found in her bed. Patient is not good historian due to history of dementia. No reports of respiratory distress. No reports of fevers. No reports of chest pain. No diarrhea or constipation. No vomiting.   In ED, BP is 172/95 and then low at 95/65, HR 66, T max 97.3 F and oxygen saturation 94% on room air. CXR showed questionable venous hypertension/ mild fluid overload but no consolidation or collapse. CT head and cervical spine showed no  acute intracranial findings. Right hip x ray showed acute fracture of the right femoral neck with varus angulation. Per Dr. Alvan Dame of orthopedic surgery, plan for surgery tomorrow since pt on anticoagulation.   Assessment and Plan:   Principal Problem:  Fracture of femoral neck, right - unclear if there was a fall or not; fracture confirmed on right hip x ray  - status post right hip hemiarthroplasty, post op day #2 - Continue physical therapy at skilled nursing facility, provide analgesia as needed Active Problems:  Acute renal insufficiency - likely pre renal from poor oral intake - Cr still mild elevated, continue to hold losartan - Monitor renal function as patient is now on Lovenox injection  Acute blood loss anemia  - post op related - no signs of active bleeding - Hemoglobin stable over the past 24 hours  Hypokalemia - Mild, supplemented and within normal limits this morning  Atrial fibrillation, rate controlled - in sinus rhythm this AM - Was on anticoagulation with xarelto prior to fall and hip fracture - We'll resume Lovenox 50 mg injection daily upon discharge - We'll defer to PCP to transition to Xarelto was clinically indicated and renal function stable - Holding metoprolol due to soft blood pressure  HTN (hypertension), essential  - BP meds on hold due to hypotension - if BP improves, may resume metoprolol and losartan   Hypothyroidism - resume synthroid   Dementia without behavioral disturbance / depression - continue aricept, namenda, lexapro   DVT of upper extremity (deep vein thrombosis) - Changed to Lovenox upon discharge as noted above  Protein-calorie malnutrition, severe - nutrition consulted, patient tolerating current diet well  Code Status: DNR/DNI Family Communication: Son at the bedside  Disposition Plan: Skilled nursing facility  IV Access:    Peripheral IV Procedures and diagnostic studies:    CXR 10/15/2014 Question  venous hypertension/ mild fluid overload. No focal consolidation or collapse.   Dg Hip Complete Right 10/15/2014 Acute fracture right femoral neck with varus angulation. Old fracture deformities of right symphysis pubis and left superior and inferior pubic rami.   Ct Head Wo Contrast 10/15/2014 1. No acute intracranial abnormalities. 2. Chronic small vessel ischemic disease and brain atrophy. 3. Severe multi level cervical spondylosis. No evidence for cervical spine fracture.   Ct Cervical Spine Wo Contrast 10/15/2014 1. No acute intracranial abnormalities. 2. Chronic small vessel ischemic disease and brain atrophy. 3. Severe multi level cervical spondylosis. No evidence for cervical spine fracture.   Right hip arthroplasty 10/16/2014 by Dr. Alvan Dame  Medical Consultants:    Ortho  Other Consultants:    Physical therapy  Anti-Infectives:    None  Discharge Exam: Filed Vitals:   10/18/14 0609  BP: 94/62  Pulse:   Temp:   Resp:    Filed Vitals:   10/17/14 2054 10/18/14 0136 10/18/14 0546 10/18/14 0609  BP: 86/54 100/69 89/62 94/62   Pulse: 73 69 64   Temp: 98.3 F (36.8 C) 98.3 F (36.8 C) 97.5 F (36.4 C)   TempSrc: Oral Oral Oral   Resp: 20 16 20    Height:      Weight:      SpO2: 93% 97% 95%     General: Pt is alert, follows commands appropriately, not in acute distress Cardiovascular: Regular rate and rhythm, no rubs, no gallops Respiratory: Clear to auscultation bilaterally, no wheezing, no crackles, no rhonchi Abdominal: Soft, non tender, non distended, bowel sounds +, no guarding  Discharge Instructions  Discharge Instructions    Weight bearing as tolerated    Complete by:  As directed             Medication List    STOP taking these medications        losartan 50 MG tablet  Commonly known as:  COZAAR     metoprolol tartrate 25 MG tablet  Commonly known as:  LOPRESSOR     rivaroxaban 10 MG Tabs tablet  Commonly known as:   XARELTO      TAKE these medications        acetaminophen 500 MG tablet  Commonly known as:  TYLENOL  Take 1 tablet (500 mg total) by mouth every 6 (six) hours as needed.     ARICEPT 23 MG Tabs tablet  Generic drug:  donepezil  Take 23 mg by mouth at bedtime.     b complex vitamins capsule  Take 1 capsule by mouth daily.     dimethicone-zinc oxide cream  Apply 1 application topically 3 (three) times daily as needed for dry skin (redeness or open areas).     enoxaparin 100 MG/ML injection  Commonly known as:  LOVENOX  Inject 0.5 mLs (50 mg total) into the skin daily.     escitalopram 10 MG tablet  Commonly known as:  LEXAPRO  Take 10 mg by mouth daily.     HYDROcodone-acetaminophen 5-325 MG per tablet  Commonly known as:  NORCO/VICODIN  Take 1-2 tablets by mouth every 6 (six) hours as needed for moderate pain.     levothyroxine 100 MCG tablet  Commonly known as:  SYNTHROID, LEVOTHROID  Take 1 tablet (100 mcg total) by mouth daily.  methocarbamol 500 MG tablet  Commonly known as:  ROBAXIN  Take 1 tablet (500 mg total) by mouth every 8 (eight) hours as needed for muscle spasms.     mirtazapine 15 MG tablet  Commonly known as:  REMERON  Take 15 mg by mouth at bedtime.     NAMENDA XR 28 MG Cp24  Generic drug:  Memantine HCl ER  Take 28 mg by mouth daily.     NUTRITIONAL SUPPLEMENT PO  Take 1 Bottle by mouth daily.     psyllium 0.52 G capsule  Commonly known as:  REGULOID  Take 0.52 g by mouth daily.     traMADol 50 MG tablet  Commonly known as:  ULTRAM  Take 1 tablet (50 mg total) by mouth every 6 (six) hours as needed (pain in right hand). Take one tablet by mouth every 6 hours as needed for pain     Vitamin D 1000 UNITS capsule  Take 1 capsule (1,000 Units total) by mouth daily.            Follow-up Information    Follow up with Mauri Pole, MD In 2 weeks.   Specialty:  Orthopedic Surgery   Why:  For wound check   Contact information:   [address] [place] [address] [phone]       Follow up with Jerlyn Ly, MD.   Specialty:  Internal Medicine   Contact information:   [address] [place] [address] [phone]        The results of significant diagnostics from this hospitalization (including imaging, microbiology, ancillary and laboratory) are listed below for reference.     Microbiology: Recent Results (from the past 240 hour(s))  Surgical pcr screen     Status: None   Collection Time: 10/16/14  8:55 AM  Result Value Ref Range Status   MRSA, PCR NEGATIVE NEGATIVE Final   Staphylococcus aureus NEGATIVE NEGATIVE Final    Comment:        The Xpert SA Assay (FDA approved for NASAL specimens in patients over 73 years of age), is one component of a comprehensive surveillance program.  Test performance has been validated by [employer] for patients greater than or equal to 1 year old. It is not intended to diagnose infection nor to guide or monitor treatment.      Labs: Basic Metabolic Panel:  Recent Labs Lab 10/15/14 0700 10/16/14 0511 10/17/14 0410 10/18/14 0533  NA 134* 135* 131* PENDING  K 3.9 3.8 3.5* PENDING  CL 94* 91* 90* PENDING  CO2 28 30 29 29   GLUCOSE 95 82 79 103*  BUN 11 18 20 23   CREATININE 0.65 1.19* 1.13* 1.19*  CALCIUM 9.4 9.3 8.6 8.5   CBC:  Recent Labs Lab 10/15/14 0700 10/16/14 0511 10/17/14 0410  WBC 10.0 7.7 8.5  NEUTROABS 7.3  --   --   HGB 12.1 13.3 10.5*  HCT 35.8* 39.0 31.7*  MCV 92.7 93.3 95.5  PLT 214 172 189   Cardiac Enzymes:  Recent Labs Lab 10/15/14 0700  TROPONINI <0.30   BNP: BNP (last 3 results)  Recent Labs  10/15/14 0700  PROBNP 7993.0*    SIGNED: Time coordinating discharge: Over 30 minutes  Faye Ramsay, MD  [facility] 10/18/2014, 7:09 AM Pager [phone]  If 7PM-7AM, please contact night-coverage [email or web address] Password [email or web address]

## 2014-10-19 ENCOUNTER — Encounter: Payer: Self-pay | Admitting: Adult Health

## 2014-10-22 ENCOUNTER — Non-Acute Institutional Stay (SKILLED_NURSING_FACILITY): Payer: Medicare Other | Admitting: Internal Medicine

## 2014-10-22 ENCOUNTER — Encounter: Payer: Self-pay | Admitting: Internal Medicine

## 2014-10-22 DIAGNOSIS — I482 Chronic atrial fibrillation, unspecified: Secondary | ICD-10-CM

## 2014-10-22 DIAGNOSIS — E43 Unspecified severe protein-calorie malnutrition: Secondary | ICD-10-CM

## 2014-10-22 DIAGNOSIS — F0390 Unspecified dementia without behavioral disturbance: Secondary | ICD-10-CM

## 2014-10-22 DIAGNOSIS — D62 Acute posthemorrhagic anemia: Secondary | ICD-10-CM

## 2014-10-22 DIAGNOSIS — F0280 Dementia in other diseases classified elsewhere without behavioral disturbance: Secondary | ICD-10-CM

## 2014-10-22 DIAGNOSIS — I10 Essential (primary) hypertension: Secondary | ICD-10-CM

## 2014-10-22 DIAGNOSIS — F0393 Unspecified dementia, unspecified severity, with mood disturbance: Secondary | ICD-10-CM

## 2014-10-22 DIAGNOSIS — N289 Disorder of kidney and ureter, unspecified: Secondary | ICD-10-CM

## 2014-10-22 DIAGNOSIS — E038 Other specified hypothyroidism: Secondary | ICD-10-CM

## 2014-10-22 DIAGNOSIS — F329 Major depressive disorder, single episode, unspecified: Secondary | ICD-10-CM

## 2014-10-22 DIAGNOSIS — S72001S Fracture of unspecified part of neck of right femur, sequela: Secondary | ICD-10-CM

## 2014-10-22 NOTE — Progress Notes (Signed)
Patient ID: Tasha Ball, female   DOB: Apr 01, 1924, 80 y.o.   MRN: [id number]     [facility]   PCP: Jerlyn Ly, MD  Code Status: DNR  No Known Allergies  Chief Complaint  Patient presents with  . New Admit To SNF     HPI:  80 y/o female patient is here for STR after hospital admission from 10/15/14-10/18/14 with acute onset right hip pain. She was noted to have acute right femoral neck fracture on hip xray and orthopedic was consulted. She underwent right hip hemiarhtroplasty. Her xarelto was changed to lovenox. She is seen in her room today. She is pleasantly confused. She is in no distress and denies any concerns. Difficult to obtain HPI and ROS from patient. As per staff she is more confused in the evening, no acute behavioral changes noted per nursing. Has been participating in therapy but her mentation limits it  She has past medical history of dementia, hypothyroidism, hypertension, DVT  Of note: Her losartan and metoprolol were held in hospital due to hypotension  Review of Systems: from staff Constitutional: Negative for fever, chills, diaphoresis.  HENT: Negative for congestion Respiratory: Negative for cough, sputum production, shortness of breath    Cardiovascular: Negative for chest pain Musculoskeletal: Negative for back pain, falls   Past Medical History  Diagnosis Date  . Urinary tract infection   . Cancer     non-hodgkins lymphoma   . Lymphoma in remission     ca free 5 yrs  . Dementia   . Atrial fibrillation   . Hypertension   . Mitral valve prolapse   . Thyroid disease   . Memory loss   . Osteoporosis   . 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 Hip fracture, left 10/16/2011  . Femoral fracture 10/27/2011  . Pelvic fracture 10/08/2013  . HTN (hypertension) 10/20/2011  . DVT (deep venous thrombosis)    Past Surgical History  Procedure Laterality Date  . Eye surgery      bil.cataract with lens implants  . Hip arthroplasty   10/18/2011    Procedure: ARTHROPLASTY BIPOLAR HIP;  Surgeon: Mauri Pole;  Location: [facility];  Service: Orthopedics;  Laterality: Left;  depuy  . Hip arthroplasty Right 10/16/2014    Procedure: ARTHROPLASTY BIPOLAR HIP;  Surgeon: Mauri Pole, MD;  Location: [facility];  Service: Orthopedics;  Laterality: Right;   Social History:   reports that she has never smoked. She has never used smokeless tobacco. She reports that she does not drink alcohol or use illicit drugs.  Family History  Problem Relation Age of Onset  . Heart disease Father   . Angina Father   . Stomach cancer Brother 98  . Coronary artery disease Brother     Medications: Patient's Medications  New Prescriptions   No medications on file  Previous Medications   ACETAMINOPHEN (TYLENOL) 500 MG TABLET    Take 1 tablet (500 mg total) by mouth every 6 (six) hours as needed.   B COMPLEX VITAMINS CAPSULE    Take 1 capsule by mouth daily.   CHOLECALCIFEROL (VITAMIN D) 1000 UNITS CAPSULE    Take 1 capsule (1,000 Units total) by mouth daily.   DONEPEZIL (ARICEPT) 23 MG TABS TABLET    Take 23 mg by mouth at bedtime.    ENOXAPARIN (LOVENOX) 100 MG/ML INJECTION    Inject 0.5 mLs (50 mg total) into the skin daily.   ESCITALOPRAM (LEXAPRO) 10 MG TABLET  Take 10 mg by mouth daily.   HYDROCODONE-ACETAMINOPHEN (NORCO/VICODIN) 5-325 MG PER TABLET    Take 1-2 tablets by mouth every 6 (six) hours as needed for moderate pain.   LEVOTHYROXINE (SYNTHROID, LEVOTHROID) 100 MCG TABLET    Take 1 tablet (100 mcg total) by mouth daily.   MEMANTINE HCL ER (NAMENDA XR) 28 MG CP24    Take 28 mg by mouth daily.   METHOCARBAMOL (ROBAXIN) 500 MG TABLET    Take 1 tablet (500 mg total) by mouth every 8 (eight) hours as needed for muscle spasms.   MIRTAZAPINE (REMERON) 15 MG TABLET    Take 15 mg by mouth at bedtime.   NUTRITIONAL SUPPLEMENTS (NUTRITIONAL SUPPLEMENT PO)    Take 1 Bottle by mouth daily.    PSYLLIUM (REGULOID) 0.52 G CAPSULE    Take 0.52 g by  mouth daily.   SKIN PROTECTANTS, MISC. (DIMETHICONE-ZINC OXIDE) CREAM    Apply 1 application topically 3 (three) times daily as needed for dry skin (redeness or open areas).   TRAMADOL (ULTRAM) 50 MG TABLET    Take 1 tablet (50 mg total) by mouth every 6 (six) hours as needed (pain in right hand). Take one tablet by mouth every 6 hours as needed for pain  Modified Medications   No medications on file  Discontinued Medications   No medications on file     Physical Exam: Filed Vitals:   10/22/14 0926  BP: 152/90  Pulse: 96  Temp: 98.4 F (36.9 C)  Resp: 20  Weight: 113 lb (51.256 kg)  SpO2: 96%    General- elderly female in no acute distress, frail Head- atraumatic, normocephalic Eyes- no pallor, no icterus, no discharge Neck- no cervical lymphadenopathy Throat- moist mucus membrane Cardiovascular- irregular heart rate Respiratory- bilateral clear to auscultation, no wheeze, no rhonchi, no crackles, no use of accessory muscles Abdomen- bowel sounds present, soft, non tender Musculoskeletal- able to move all 4 extremities, limited ROM on right leg, trace edema present, on WC Neurological- pleasantly confused, follows basic commands  Skin- warm and dry, dressing site clean and dry   Labs reviewed: Basic Metabolic Panel:  Recent Labs  10/16/14 0511 10/17/14 0410 10/18/14 0533  NA 135* 131* 132*  K 3.8 3.5* 4.5  CL 91* 90* 95*  CO2 30 29 29   GLUCOSE 82 79 103*  BUN 18 20 23   CREATININE 1.19* 1.13* 1.19*  CALCIUM 9.3 8.6 8.5   CBC:  Recent Labs  08/16/14 1534  10/15/14 0700 10/16/14 0511 10/17/14 0410 10/18/14 0533  WBC 10.0  < > 10.0 7.7 8.5 10.2  NEUTROABS 6.5  --  7.3  --   --   --   HGB 11.7*  < > 12.1 13.3 10.5* 10.0*  HCT 35.8*  < > 35.8* 39.0 31.7* 30.5*  MCV 95.5  < > 92.7 93.3 95.5 95.6  PLT 190  < > 214 172 189 198  < > = values in this interval not displayed. Cardiac Enzymes:  Recent Labs  10/15/14 0700  TROPONINI <0.30    Radiological  Exams:  CXR 10/15/2014    Question venous hypertension/ mild fluid overload. No focal consolidation or collapse.      Dg Hip Complete Right 10/15/2014   Acute fracture right femoral neck with varus angulation. Old fracture deformities of right symphysis pubis and left superior and inferior pubic rami.      Ct Head Wo Contrast 10/15/2014   1. No acute intracranial abnormalities. 2. Chronic small vessel ischemic  disease and brain atrophy. 3. Severe multi level cervical spondylosis. No evidence for cervical spine fracture.       Ct Cervical Spine Wo Contrast 10/15/2014  1. No acute intracranial abnormalities. 2. Chronic small vessel ischemic disease and brain atrophy. 3. Severe multi level cervical spondylosis. No evidence for cervical spine fracture.     Right hip arthroplasty 10/16/2014 by Dr. Alvan Dame     Assessment/Plan  Fracture of femoral neck, right status post right hip hemiarthroplasty, Will have patient work with PT/OT as tolerated to regain strength and restore function.  Fall precautions are in place. WBAT. On ultram and norco prn for pain and robaxin prn for muscle spasm. Monitor and assess for need to change to scheduled vs reducing the frequency.  Continue lovenox injection for dvt prophylaxis/ rx with her hx of dvt. Has d/u with orthopedics. Continue vit d supplement  Dementia Persists, decline anticipated. Continue skin care, fall precautions, assistance with ADLs.. Continue aricept, namenda, b complex.   Depression Continue lexapro and remeron for now  Protein calorie malnutrition Continue medpass supplement. Monitor weight weekly. Continue skin care  Anemia Likely post op wit blood loss. Monitor h&h  Atrial fibrillation On lovenox for now.  Metoprolol was held with low bp in hospital. Will resume this with high bp and HR > 90. She was on Metoprolol 25 mg bid before. Start 12.5 mg bid for now  HTN bp elevated. Continue to hold losartan but will reintroduce metoprolol.  Monitor bp. See above. Titrate dosing further if bp > 140/90 or HR > 90/min  renal insufficiency Monitor renal function.   Hypothyroidism Continue synthroid    Family/ staff Communication: reviewed care plan with patient and nursing supervisor   Goals of care: short term rehabilitation    Labs/tests ordered: cbc, cmp    Blanchie Serve, MD  [facility] [phone] (Monday-Friday 8 am - 5 pm) [phone] (afterhours)

## 2014-10-29 ENCOUNTER — Other Ambulatory Visit: Payer: Self-pay | Admitting: *Deleted

## 2014-10-29 MED ORDER — AMBULATORY NON FORMULARY MEDICATION: Status: AC

## 2014-10-29 MED ORDER — LORAZEPAM 2 MG/ML IJ SOLN: INTRAMUSCULAR | Status: AC

## 2014-10-29 NOTE — Telephone Encounter (Signed)
[facility] 

## 2015-01-16 NOTE — Progress Notes (Signed)
This encounter was created in error - please disregard.

## 2015-09-02 ENCOUNTER — Emergency Department (HOSPITAL_COMMUNITY)
Admission: EM | Admit: 2015-09-02 | Discharge: 2015-10-01 | Disposition: E | Payer: Medicare Other | Attending: Emergency Medicine | Admitting: Emergency Medicine

## 2015-09-02 DIAGNOSIS — Z7901 Long term (current) use of anticoagulants: Secondary | ICD-10-CM | POA: Insufficient documentation

## 2015-09-02 DIAGNOSIS — Z8739 Personal history of other diseases of the musculoskeletal system and connective tissue: Secondary | ICD-10-CM | POA: Diagnosis not present

## 2015-09-02 DIAGNOSIS — F0390 Unspecified dementia without behavioral disturbance: Secondary | ICD-10-CM | POA: Insufficient documentation

## 2015-09-02 DIAGNOSIS — Z87440 Personal history of urinary (tract) infections: Secondary | ICD-10-CM | POA: Insufficient documentation

## 2015-09-02 DIAGNOSIS — R5383 Other fatigue: Secondary | ICD-10-CM | POA: Diagnosis present

## 2015-09-02 DIAGNOSIS — I213 ST elevation (STEMI) myocardial infarction of unspecified site: Secondary | ICD-10-CM | POA: Diagnosis not present

## 2015-09-02 DIAGNOSIS — Z8614 Personal history of Methicillin resistant Staphylococcus aureus infection: Secondary | ICD-10-CM | POA: Insufficient documentation

## 2015-09-02 DIAGNOSIS — Z79899 Other long term (current) drug therapy: Secondary | ICD-10-CM | POA: Insufficient documentation

## 2015-09-02 DIAGNOSIS — Z86718 Personal history of other venous thrombosis and embolism: Secondary | ICD-10-CM | POA: Diagnosis not present

## 2015-09-02 DIAGNOSIS — I10 Essential (primary) hypertension: Secondary | ICD-10-CM | POA: Diagnosis not present

## 2015-09-02 DIAGNOSIS — Z8589 Personal history of malignant neoplasm of other organs and systems: Secondary | ICD-10-CM | POA: Insufficient documentation

## 2015-09-02 DIAGNOSIS — E079 Disorder of thyroid, unspecified: Secondary | ICD-10-CM | POA: Diagnosis not present

## 2015-09-02 DIAGNOSIS — Z8781 Personal history of (healed) traumatic fracture: Secondary | ICD-10-CM | POA: Insufficient documentation

## 2015-09-02 DIAGNOSIS — R001 Bradycardia, unspecified: Secondary | ICD-10-CM | POA: Diagnosis not present

## 2015-09-02 NOTE — ED Notes (Signed)
Per EMS, pt is from . Staff states that the pt became more and more lethargic during the evening, and at one point was unresponsive. EMS states that the pt was alert and talking upon their arrival to the facility. Pt became unresponsive shortly before her arrival in the ER, and remains unresponsive. Pt with hx of DVT and PE.

## 2015-09-02 NOTE — ED Provider Notes (Signed)
CSN: [id number]   Arrival date & time 09/10/2015 2337  History  By signing my name below, I, Tasha Ball, attest that this documentation has been prepared under the direction and in the presence of Tasha Biles, MD. Electronically Signed: Altamease Ball, ED Scribe. Sep 14, 2015. 12:47 AM.  Chief Complaint  Patient presents with  . Fatigue  . Altered Mental Status   Level V caveat HPI The history is provided by the EMS personnel and a relative. The history is limited by the condition of the patient. No language interpreter was used.   Brought in by EMS from the nursing home, Tasha Ball is a 81 y.o. female with PMHx of dementia, a-fib, cancer, HTN, and DVT who presents to the Emergency Department complaining of unresponsiveness. The patient's son was called tonight by nursing home staff and told that the pt may be dying. She initially seemed "out of it" but improved after drinking Sprite. Pt requested to come to the hospital. On EMS arrival she was talking but became less responsive en route. Pt is DNR. Code STEMI activated and Cardiologist is at the bedside.  LEVEL 5 CAVEAT FOR UNRESPONSIVENESS  Past Medical History  Diagnosis Date  . Urinary tract infection   . Cancer ([place])     non-hodgkins lymphoma   . Lymphoma in remission ([place])     ca free 5 yrs  . Dementia   . Atrial fibrillation ([place])   . Hypertension   . Mitral valve prolapse   . Thyroid disease   . Memory loss   . Osteoporosis   . 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 Hip fracture, left ([place]) 10/16/2011  . Femoral fracture ([place]) 10/27/2011  . Pelvic fracture ([place]) 10/08/2013  . HTN (hypertension) 10/20/2011  . DVT (deep venous thrombosis) [facility])     Past Surgical History  Procedure Laterality Date  . Eye surgery      bil.cataract with lens implants  . Hip arthroplasty  10/18/2011    Procedure: ARTHROPLASTY BIPOLAR HIP;  Surgeon: Mauri Pole;  Location: [facility];  Service: Orthopedics;  Laterality: Left;  depuy  . Hip  arthroplasty Right 10/16/2014    Procedure: ARTHROPLASTY BIPOLAR HIP;  Surgeon: Mauri Pole, MD;  Location: [facility];  Service: Orthopedics;  Laterality: Right;    Family History  Problem Relation Age of Onset  . Heart disease Father   . Angina Father   . Stomach cancer Brother 78  . Coronary artery disease Brother     Social History  Substance Use Topics  . Smoking status: Never Smoker   . Smokeless tobacco: Never Used  . Alcohol Use: No     Review of Systems  Unable to perform ROS: Patient unresponsive   Home Medications   Prior to Admission medications   Medication Sig Start Date End Date Taking? Authorizing Provider  acetaminophen (TYLENOL) 500 MG tablet Take 1 tablet (500 mg total) by mouth every 6 (six) hours as needed. 10/16/14   Paralee Cancel, MD  AMBULATORY NON FORMULARY MEDICATION Lorazepam 1mg /ml Gel Sig: Apply 57ml transdermally every 6 hours as needed for anixety 10/29/14   Tiffany L Reed, DO  b complex vitamins capsule Take 1 capsule by mouth daily.    Historical Provider, MD  Cholecalciferol (VITAMIN D) 1000 UNITS capsule Take 1 capsule (1,000 Units total) by mouth daily. 02/09/14   Gerlene Fee, NP  donepezil (ARICEPT) 23 MG TABS tablet Take 23 mg by mouth at bedtime.     Historical Provider, MD  enoxaparin (LOVENOX) 100 MG/ML injection Inject 0.5 mLs (50 mg total) into the skin daily. 10/18/14   Theodis Blaze, MD  escitalopram (LEXAPRO) 10 MG tablet Take 10 mg by mouth daily.    Historical Provider, MD  HYDROcodone-acetaminophen (NORCO/VICODIN) 5-325 MG per tablet Take 1-2 tablets by mouth every 6 (six) hours as needed for moderate pain. 10/18/14   Theodis Blaze, MD  levothyroxine (SYNTHROID, LEVOTHROID) 100 MCG tablet Take 1 tablet (100 mcg total) by mouth daily. 02/09/14   Gerlene Fee, NP  LORazepam (ATIVAN) 2 MG/ML injection Inject 0.18ml intramuscularly every 6 hours as needed for agitation and combative behaviors 10/29/14   Tiffany L Reed, DO  Memantine HCl  ER (NAMENDA XR) 28 MG CP24 Take 28 mg by mouth daily.    Historical Provider, MD  methocarbamol (ROBAXIN) 500 MG tablet Take 1 tablet (500 mg total) by mouth every 8 (eight) hours as needed for muscle spasms. 10/18/14   Theodis Blaze, MD  mirtazapine (REMERON) 15 MG tablet Take 15 mg by mouth at bedtime.    Historical Provider, MD  Nutritional Supplements (NUTRITIONAL SUPPLEMENT PO) Take 1 Bottle by mouth daily.     Historical Provider, MD  psyllium (REGULOID) 0.52 G capsule Take 0.52 g by mouth daily.    Historical Provider, MD  Skin Protectants, Misc. (DIMETHICONE-ZINC OXIDE) cream Apply 1 application topically 3 (three) times daily as needed for dry skin (redeness or open areas).    Historical Provider, MD  traMADol (ULTRAM) 50 MG tablet Take 1 tablet (50 mg total) by mouth every 6 (six) hours as needed (pain in right hand). Take one tablet by mouth every 6 hours as needed for pain 10/18/14   Theodis Blaze, MD    Allergies  Review of patient's allergies indicates no known allergies.  Triage Vitals: There were no vitals taken for this visit.  Physical Exam  Eyes:  Pupils 4 mm and equal  Cardiovascular:  initially no palpable pulse  Pulmonary/Chest:  Agonal breathing  Abdominal: Soft.  Neurological:  GCS 3   Skin:  COOL SKIN  Nursing note and vitals reviewed.   ED Course  Procedures  COORDINATION OF CARE: 11:53 PM Treatment plan which includes comfort measures discussed with the patient's son who is in agreement. Dr. Aundra Dubin (Cardiolgy) at bedside.   12:31 AM Pt reevaluated. TOD 12:32 AM.  Labs Reviewed - No data to display  Imaging Review No results found.   EKG Interpretation  Date/Time:  Monday September 02 2015 23:44:59 EDT Ventricular Rate:  71 PR Interval:    QRS Duration: 107 QT Interval:  469 QTC Calculation: 510 R Axis:   113 Text Interpretation:  STEMI Atrial fibrillation Inferior infarct, acute (RCA) Anteroseptal infarct, age indeterminate Prolonged QT  interval inferior and anterior ST elevation with lateral depression Confirmed by Kathrynn Humble, MD, Thelma Comp [phone]) on 09-10-15 12:47:05 AM         MDM   Final diagnoses:  ST elevation myocardial infarction (STEMI), unspecified artery ([place])    I personally performed the services described in this documentation, which was scribed in my presence. The recorded information has been reviewed and is accurate.  Pt comes in with cc of AMS. Pt is noted to be in ACUTE STEMI and bradycardia. Cards at bedside and pt's son at bedside. Pt is DNR. We had immediate goals of care discussion, and family confirmed that pt would not want chest compressions or intubation. With the Cardiologist, we discussed goals of care and the  likely outcome if she was not taken to the cath lab - and family decided to have patient as comfort care.  Pt had a GCS of 3 during my evaluation, and agonal respirations and no pulse with cold clammy skin. I suspect she is in Cardiogenic shock after a massive heart attack.  Pt passed away peacefully and she was pronounced deceased at 12:32 am. Family at bedside.   Tasha Biles, MD 2015-09-17 [phone]

## 2015-09-03 ENCOUNTER — Encounter (HOSPITAL_COMMUNITY): Payer: Self-pay | Admitting: Emergency Medicine

## 2015-09-03 MED ORDER — ATROPINE SULFATE 1 MG/ML IJ SOLN
1.0000 mg | Freq: Once | INTRAMUSCULAR | Status: AC
  Administered 2015-09-02: 1 mg via INTRAVENOUS

## 2015-09-03 MED FILL — Medication: Qty: 1 | Status: AC

## 2015-10-01 NOTE — ED Notes (Signed)
Time of death 85.

## 2015-10-01 NOTE — ED Notes (Signed)
Pt's family has left the bedside. Pt's family given the contact information for Patient Placement.

## 2015-10-01 NOTE — ED Notes (Signed)
Patient taken to the morgue.

## 2015-10-01 DEATH — deceased
